# Patient Record
Sex: Male | Born: 1965 | Race: Black or African American | Hispanic: No | Marital: Married | Smoking: Former smoker
Health system: Southern US, Community
[De-identification: ages and names within clinical notes are randomized; demographics above are authoritative.]

## PROBLEM LIST (undated history)

## (undated) DIAGNOSIS — G20C Parkinsonism, unspecified: Secondary | ICD-10-CM

## (undated) DIAGNOSIS — G4752 REM sleep behavior disorder: Secondary | ICD-10-CM

## (undated) DIAGNOSIS — K635 Polyp of colon: Secondary | ICD-10-CM

## (undated) DIAGNOSIS — I10 Essential (primary) hypertension: Secondary | ICD-10-CM

## (undated) DIAGNOSIS — E7800 Pure hypercholesterolemia, unspecified: Secondary | ICD-10-CM

## (undated) DIAGNOSIS — R251 Tremor, unspecified: Secondary | ICD-10-CM

## (undated) HISTORY — DX: Polyp of colon: K63.5

## (undated) HISTORY — DX: REM sleep behavior disorder: G47.52

## (undated) HISTORY — DX: Pure hypercholesterolemia, unspecified: E78.00

## (undated) HISTORY — DX: Essential (primary) hypertension: I10

## (undated) HISTORY — DX: Parkinsonism, unspecified: G20.C

## (undated) HISTORY — DX: Tremor, unspecified: R25.1

---

## 1994-03-09 HISTORY — PX: REPLACEMENT TOTAL KNEE: SUR1224

## 2001-09-24 ENCOUNTER — Emergency Department (HOSPITAL_COMMUNITY): Admission: EM | Admit: 2001-09-24 | Discharge: 2001-09-24 | Payer: Self-pay | Admitting: *Deleted

## 2011-03-25 ENCOUNTER — Ambulatory Visit: Payer: BC Managed Care – PPO | Attending: Family Medicine

## 2011-03-25 DIAGNOSIS — M25519 Pain in unspecified shoulder: Secondary | ICD-10-CM | POA: Insufficient documentation

## 2011-03-25 DIAGNOSIS — IMO0001 Reserved for inherently not codable concepts without codable children: Secondary | ICD-10-CM | POA: Insufficient documentation

## 2011-03-27 ENCOUNTER — Encounter: Payer: BC Managed Care – PPO | Admitting: Physical Therapy

## 2011-03-31 ENCOUNTER — Ambulatory Visit: Payer: BC Managed Care – PPO | Admitting: Physical Therapy

## 2011-04-02 ENCOUNTER — Ambulatory Visit: Payer: BC Managed Care – PPO | Admitting: Physical Therapy

## 2011-04-07 ENCOUNTER — Ambulatory Visit: Payer: BC Managed Care – PPO | Admitting: Physical Therapy

## 2011-04-09 ENCOUNTER — Ambulatory Visit: Payer: BC Managed Care – PPO | Admitting: Physical Therapy

## 2011-04-14 ENCOUNTER — Ambulatory Visit: Payer: BC Managed Care – PPO | Attending: Family Medicine | Admitting: Physical Therapy

## 2011-04-14 DIAGNOSIS — IMO0001 Reserved for inherently not codable concepts without codable children: Secondary | ICD-10-CM | POA: Insufficient documentation

## 2011-04-14 DIAGNOSIS — M25519 Pain in unspecified shoulder: Secondary | ICD-10-CM | POA: Insufficient documentation

## 2011-04-16 ENCOUNTER — Ambulatory Visit: Payer: BC Managed Care – PPO | Admitting: Physical Therapy

## 2012-04-23 ENCOUNTER — Emergency Department (HOSPITAL_COMMUNITY)
Admission: EM | Admit: 2012-04-23 | Discharge: 2012-04-23 | Disposition: A | Discharge status: Home / Self Care | Payer: BC Managed Care – PPO | Source: Home / Self Care | Attending: Family Medicine | Admitting: Family Medicine

## 2012-04-23 ENCOUNTER — Encounter (HOSPITAL_COMMUNITY): Payer: Self-pay | Admitting: Emergency Medicine

## 2012-04-23 DIAGNOSIS — M5432 Sciatica, left side: Secondary | ICD-10-CM

## 2012-04-23 DIAGNOSIS — M5430 Sciatica, unspecified side: Secondary | ICD-10-CM

## 2012-04-23 MED ORDER — IBUPROFEN 600 MG PO TABS: 600.0000 mg | ORAL_TABLET | Freq: Three times a day (TID) | ORAL | Status: DC | PRN

## 2012-04-23 MED ORDER — PREDNISONE 20 MG PO TABS: ORAL_TABLET | ORAL | Status: DC

## 2012-04-23 MED ORDER — CYCLOBENZAPRINE HCL 10 MG PO TABS: 10.0000 mg | ORAL_TABLET | Freq: Two times a day (BID) | ORAL | Status: DC | PRN

## 2012-04-23 MED ORDER — TRAMADOL HCL 50 MG PO TABS: 50.0000 mg | ORAL_TABLET | Freq: Three times a day (TID) | ORAL | Status: DC | PRN

## 2012-04-23 NOTE — ED Notes (Signed)
Pt states that he recently had a pulled muscle in his lower back and felt some pain on Monday but kept working through it. Pain has gradually gotten worse and now is having pain/numbness in left leg.  Pt has not tried any medications for symptoms.

## 2012-04-27 NOTE — ED Provider Notes (Signed)
History     CSN: [id number]  Arrival date & time 04/23/12  1650   First MD Initiated Contact with Patient 04/23/12 1706      Chief Complaint  Patient presents with  . Back Pain    lower back pain. recently dx with pulled muscle in back. reinjured at work.     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 Comes c/o left lower back pain with radiation to left thigh for 4 days. Denies known injury or recent falls. Reports pain started gradually and was initially mild and has continued to go to work but pain is getting worse. Not taking any medication. No abdominal pain, no dysuria or hematuria. No incontinence or urinary retention. Reports tingling sensation in left thigh but denies low extremity numbness or weakness.    History reviewed. No pertinent past medical history.  History reviewed. No pertinent past surgical history.  History reviewed. No pertinent family history.  History  Substance Use Topics  . Smoking status: Current Every Day Smoker -- 0.50 packs/day    Types: Cigarettes  . Smokeless tobacco: Not on file  . Alcohol Use: Yes     Comment: occasional      Review of Systems  Constitutional: Negative for fever and chills.  Gastrointestinal: Negative for nausea and abdominal pain.  Genitourinary: Negative for dysuria, hematuria and flank pain.  Musculoskeletal: Positive for back pain.  Skin: Negative for rash.  Neurological: Negative for weakness and numbness.    Allergies  Review of patient's allergies indicates no known allergies.  Home Medications   Current Outpatient Rx  Name  Route  Sig  Dispense  Refill  . Atorvastatin Calcium (LIPITOR PO)   Oral   Take by mouth.         . cyclobenzaprine (FLEXERIL) 10 MG tablet   Oral   Take 1 tablet (10 mg total) by mouth 2 (two) times daily as needed for muscle spasms.   20 tablet   0   . ibuprofen  (ADVIL,MOTRIN) 600 MG tablet   Oral   Take 1 tablet (600 mg total) by mouth every 8 (eight) hours as needed for pain. Take with meals   30 tablet   0   . predniSONE (DELTASONE) 20 MG tablet      2 tabs by mouth daily for 5 days   10 tablet   0   . traMADol (ULTRAM) 50 MG tablet   Oral   Take 1 tablet (50 mg total) by mouth every 8 (eight) hours as needed for pain.   20 tablet   0     BP 191/90  Pulse 62  Temp(Src) 98.7 F (37.1 C) (Oral)  Resp 18  SpO2 100%  Physical Exam  Nursing note and vitals reviewed. Constitutional: He is oriented to person, place, and time. He appears well-developed and well-nourished. No distress.  HENT:  Head: Normocephalic and atraumatic.  Cardiovascular: Normal heart sounds.   Pulmonary/Chest: Breath sounds normal.  Abdominal: Soft. There is no tenderness.  No CVT bilat  Musculoskeletal:  Central spine with no scoliosis or kyphosis. Fair anterior flexion and posterior extension. Patient able to walk on tip toes and heels with no difficulty foot drop or pain exacerbation. No bone prominence tenderness. Tenderness to palpation in bilateral lumbar paravertebral muscles worse in left side. Negative straight leg test bilateral. Intact sensation and symmetric + DTRs (rotullian and achillean) in low extremities.   Neurological: He is alert and  oriented to person, place, and time.  Skin: No rash noted. He is not diaphoretic.    ED Course  Procedures (including critical care time)  Labs Reviewed - No data to display No results found.   1. Sciatica neuralgia, left       MDM  Treated with prednisone, ibuprofen, tramadol and flexeril. Supportive care including rehab exercises and red flags that should prompt return to medical attention discussed with patient and provided in writing.         Sharin Grave, MD 04/27/12 [phone]

## 2013-03-09 HISTORY — PX: COLONOSCOPY: SHX174

## 2016-10-08 ENCOUNTER — Telehealth: Payer: Self-pay

## 2016-10-08 NOTE — Telephone Encounter (Signed)
SENT NOTES TO SCHEDULING 

## 2016-10-16 ENCOUNTER — Telehealth: Payer: Self-pay | Admitting: Internal Medicine

## 2016-10-16 NOTE — Telephone Encounter (Signed)
Received incoming records from [facility] for upcoming appointment on 11/03/16 @ 11:15am with Dr. Rennis GoldenHilty. [facility] 10/16/16 ab

## 2016-10-20 ENCOUNTER — Encounter: Payer: Self-pay | Admitting: *Deleted

## 2016-11-03 ENCOUNTER — Ambulatory Visit: Payer: Self-pay | Admitting: Internal Medicine

## 2016-11-03 ENCOUNTER — Ambulatory Visit (INDEPENDENT_AMBULATORY_CARE_PROVIDER_SITE_OTHER): Payer: BLUE CROSS/BLUE SHIELD | Admitting: Internal Medicine

## 2016-11-03 ENCOUNTER — Encounter: Payer: Self-pay | Admitting: Internal Medicine

## 2016-11-03 VITALS — BP 132/82 | HR 61 | Ht 68.0 in | Wt 187.2 lb

## 2016-11-03 DIAGNOSIS — R0789 Other chest pain: Secondary | ICD-10-CM

## 2016-11-03 DIAGNOSIS — Z8249 Family history of ischemic heart disease and other diseases of the circulatory system: Secondary | ICD-10-CM

## 2016-11-03 DIAGNOSIS — E785 Hyperlipidemia, unspecified: Secondary | ICD-10-CM | POA: Diagnosis not present

## 2016-11-03 DIAGNOSIS — F17200 Nicotine dependence, unspecified, uncomplicated: Secondary | ICD-10-CM

## 2016-11-03 HISTORY — DX: Other chest pain: R07.89

## 2016-11-03 HISTORY — DX: Family history of ischemic heart disease and other diseases of the circulatory system: Z82.49

## 2016-11-03 HISTORY — DX: Hyperlipidemia, unspecified: E78.5
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
## 2016-11-03 NOTE — Patient Instructions (Addendum)
Your physician has requested that you have an exercise tolerance test. For further information please visit [email or web address] Please also follow instruction sheet, as given.  Your physician has requested that you have an abdominal aorta duplex. During this test, an ultrasound is used to evaluate the aorta. Allow 30 minutes for this exam. Do not eat after midnight the day before and avoid carbonated beverages  Your physician recommends that you schedule a follow-up appointment with Dr. Rennis Golden after your testing.

## 2016-11-06 ENCOUNTER — Telehealth (HOSPITAL_COMMUNITY): Payer: Self-pay

## 2016-11-06 NOTE — Telephone Encounter (Signed)
Encounter complete. 

## 2016-11-11 ENCOUNTER — Ambulatory Visit (HOSPITAL_COMMUNITY)
Admission: RE | Admit: 2016-11-11 | Discharge: 2016-11-11 | Disposition: A | Discharge status: Home / Self Care | Payer: BLUE CROSS/BLUE SHIELD | Source: Ambulatory Visit | Attending: Cardiology | Admitting: Cardiology

## 2016-11-11 DIAGNOSIS — R9439 Abnormal result of other cardiovascular function study: Secondary | ICD-10-CM | POA: Diagnosis not present

## 2016-11-11 DIAGNOSIS — Z8249 Family history of ischemic heart disease and other diseases of the circulatory system: Secondary | ICD-10-CM | POA: Diagnosis not present

## 2016-11-11 DIAGNOSIS — R0789 Other chest pain: Secondary | ICD-10-CM | POA: Diagnosis not present

## 2016-11-11 DIAGNOSIS — E785 Hyperlipidemia, unspecified: Secondary | ICD-10-CM | POA: Diagnosis not present

## 2016-11-11 LAB — EXERCISE TOLERANCE TEST
CHL CUP RESTING HR STRESS: 58 {beats}/min
CHL RATE OF PERCEIVED EXERTION: 18
CSEPEDS: 52 s
CSEPEW: 9.8 METS
CSEPHR: 86 %
CSEPPHR: 146 {beats}/min
Exercise duration (min): 7 min
MPHR: 168 {beats}/min

## 2016-11-12 ENCOUNTER — Other Ambulatory Visit: Payer: Self-pay | Admitting: *Deleted

## 2016-11-12 DIAGNOSIS — R9430 Abnormal result of cardiovascular function study, unspecified: Secondary | ICD-10-CM

## 2016-11-12 DIAGNOSIS — R0789 Other chest pain: Secondary | ICD-10-CM

## 2016-11-16 ENCOUNTER — Telehealth: Payer: Self-pay | Admitting: Internal Medicine

## 2016-11-16 ENCOUNTER — Ambulatory Visit (HOSPITAL_COMMUNITY)
Admission: RE | Admit: 2016-11-16 | Discharge: 2016-11-16 | Disposition: A | Discharge status: Home / Self Care | Payer: BLUE CROSS/BLUE SHIELD | Source: Ambulatory Visit | Attending: Cardiology | Admitting: Cardiology

## 2016-11-16 DIAGNOSIS — Z01812 Encounter for preprocedural laboratory examination: Secondary | ICD-10-CM

## 2016-11-16 DIAGNOSIS — I708 Atherosclerosis of other arteries: Secondary | ICD-10-CM | POA: Diagnosis not present

## 2016-11-16 DIAGNOSIS — E785 Hyperlipidemia, unspecified: Secondary | ICD-10-CM | POA: Diagnosis not present

## 2016-11-16 DIAGNOSIS — Z8249 Family history of ischemic heart disease and other diseases of the circulatory system: Secondary | ICD-10-CM | POA: Diagnosis present

## 2016-11-16 DIAGNOSIS — F17200 Nicotine dependence, unspecified, uncomplicated: Secondary | ICD-10-CM

## 2016-11-16 DIAGNOSIS — Z79899 Other long term (current) drug therapy: Secondary | ICD-10-CM

## 2016-11-16 DIAGNOSIS — I700 Atherosclerosis of aorta: Secondary | ICD-10-CM | POA: Diagnosis not present

## 2016-11-16 NOTE — Telephone Encounter (Signed)
Spoke with patient. Communicated results. He agrees to further testing (coronary CT) and is aware to have BMET done a few days prior to CT at our office.

## 2016-11-16 NOTE — Telephone Encounter (Signed)
Follow up ° ° °Pt is returning call to nurse.  °

## 2016-11-16 NOTE — Telephone Encounter (Signed)
Notes recorded by Chrystie NoseHilty, Kenneth C, MD on 11/12/2016 at 8:28 AM EDT Equivocal exercise stress test with chest pain complaints and borderline ST changes. Recommend a coronary CT angiogram. Follow-up with me afterwards.  DR. H  ______________ [facility]

## 2016-11-16 NOTE — Telephone Encounter (Signed)
New Message ° ° pt verbalized that he is returning call for rn  °

## 2016-12-04 ENCOUNTER — Ambulatory Visit (INDEPENDENT_AMBULATORY_CARE_PROVIDER_SITE_OTHER): Payer: BLUE CROSS/BLUE SHIELD | Admitting: Internal Medicine

## 2016-12-04 ENCOUNTER — Encounter: Payer: Self-pay | Admitting: Internal Medicine

## 2016-12-04 VITALS — BP 124/80 | HR 55 | Ht 69.5 in | Wt 188.6 lb

## 2016-12-04 DIAGNOSIS — E785 Hyperlipidemia, unspecified: Secondary | ICD-10-CM

## 2016-12-04 DIAGNOSIS — F17200 Nicotine dependence, unspecified, uncomplicated: Secondary | ICD-10-CM | POA: Diagnosis not present

## 2016-12-04 DIAGNOSIS — R0789 Other chest pain: Secondary | ICD-10-CM | POA: Diagnosis not present

## 2016-12-04 NOTE — Patient Instructions (Signed)
Your physician recommends that you schedule a follow-up appointment in: As Needed    
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
## 2017-02-03 ENCOUNTER — Encounter: Payer: Self-pay | Admitting: Internal Medicine

## 2019-03-07 ENCOUNTER — Other Ambulatory Visit: Payer: Self-pay | Admitting: Family Medicine

## 2019-03-07 ENCOUNTER — Ambulatory Visit
Admission: RE | Admit: 2019-03-07 | Discharge: 2019-03-07 | Disposition: A | Discharge status: Home / Self Care | Payer: BLUE CROSS/BLUE SHIELD | Source: Ambulatory Visit | Attending: Family Medicine | Admitting: Family Medicine

## 2019-03-07 DIAGNOSIS — R634 Abnormal weight loss: Secondary | ICD-10-CM

## 2019-03-07 DIAGNOSIS — R1084 Generalized abdominal pain: Secondary | ICD-10-CM

## 2019-03-07 IMAGING — DX DG CHEST 2V
2 series · 2 of 2 positions shown · non-contrast
Comparison: None.

CLINICAL DATA: Abnormal weight loss

EXAM:
CHEST - 2 VIEW

[dg chest 2 view (1 of 2)]
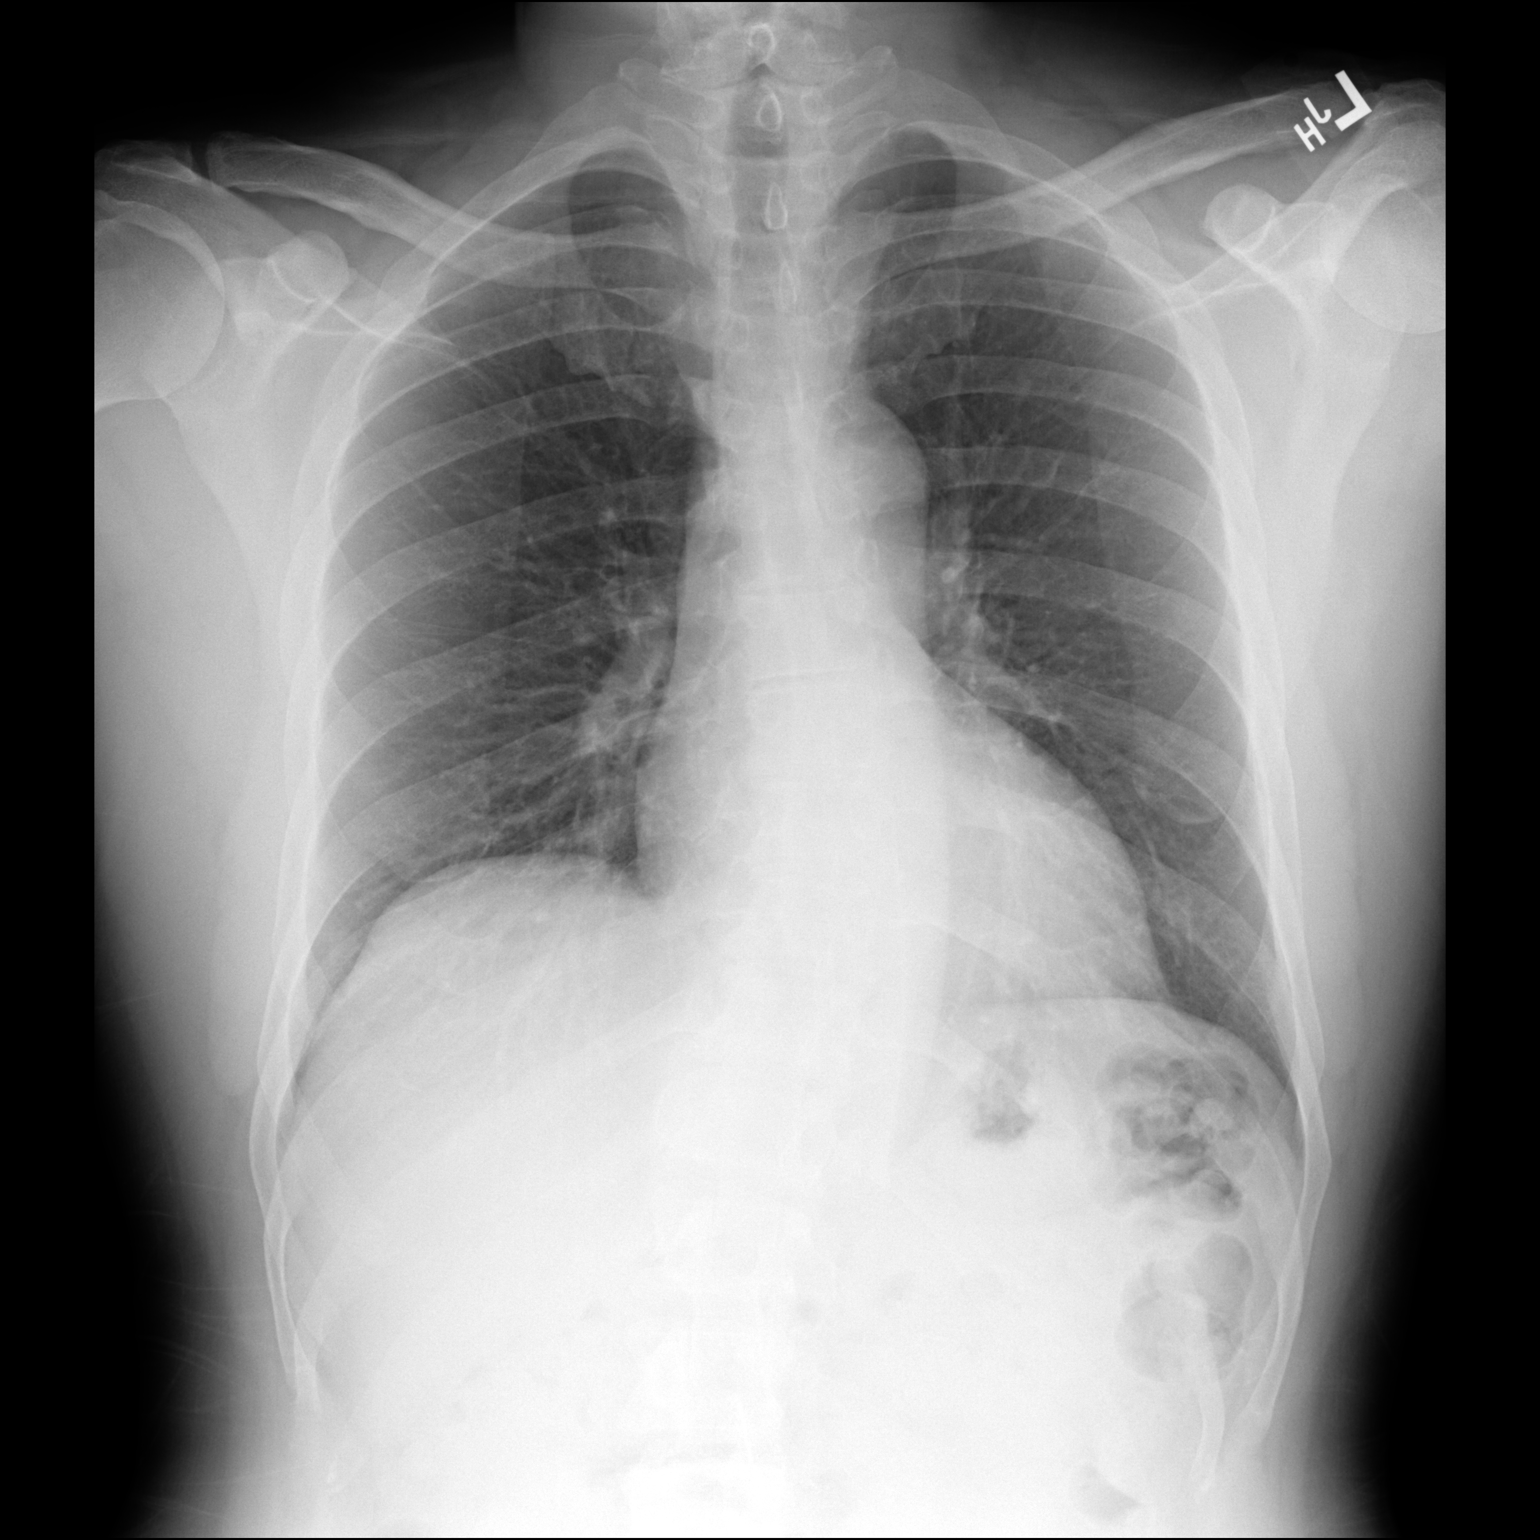

[dg chest 2 view (2 of 2)]
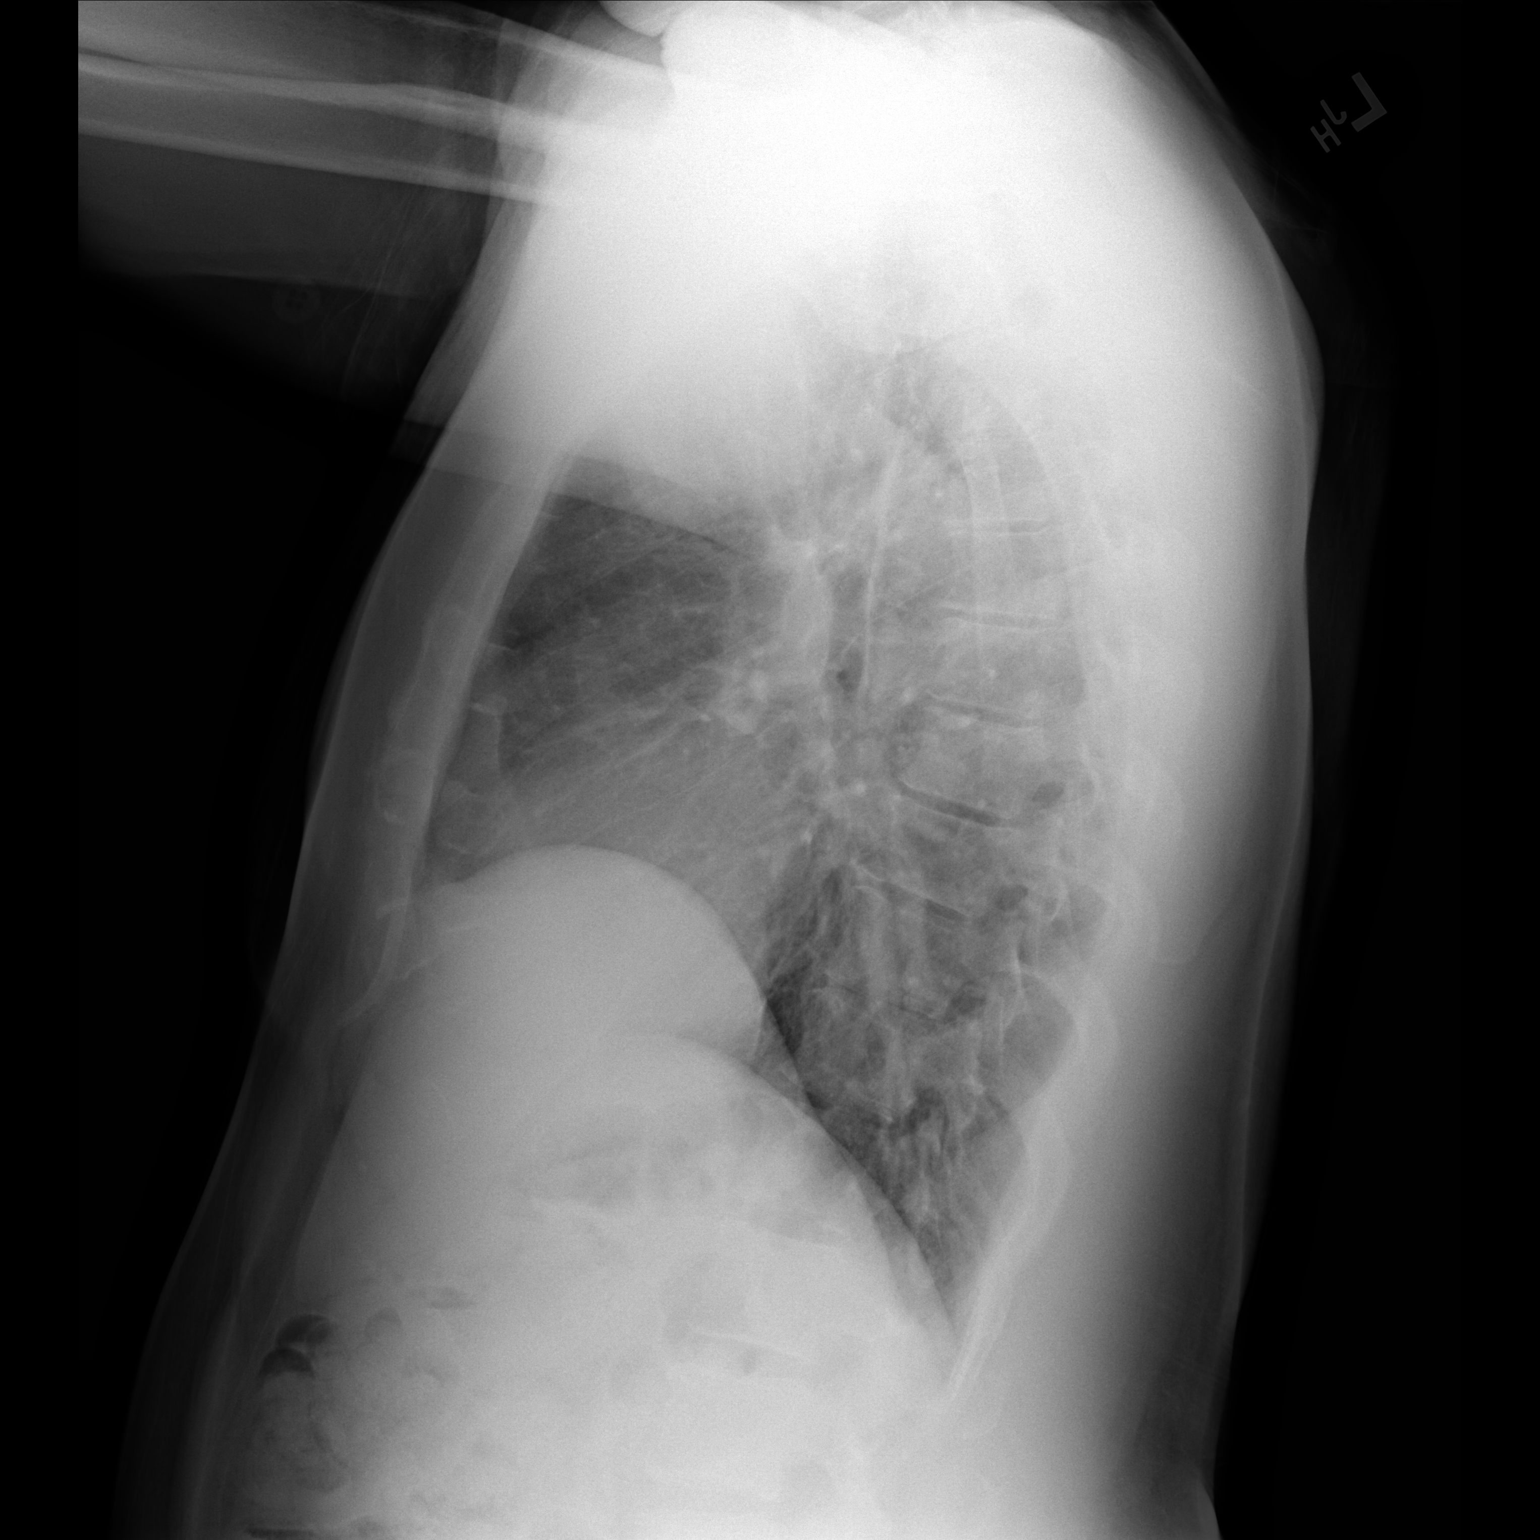

[2 of 2 positions shown; findings below may reference images not displayed]

FINDINGS: The heart size and mediastinal contours are within normal limits.
Both lungs are clear. The visualized skeletal structures are
unremarkable.
IMPRESSION: No active cardiopulmonary disease.

## 2019-03-17 ENCOUNTER — Ambulatory Visit
Admission: RE | Admit: 2019-03-17 | Discharge: 2019-03-17 | Disposition: A | Discharge status: Home / Self Care | Payer: Managed Care, Other (non HMO) | Source: Ambulatory Visit | Attending: Family Medicine | Admitting: Family Medicine

## 2019-03-17 DIAGNOSIS — R1084 Generalized abdominal pain: Secondary | ICD-10-CM

## 2019-03-17 IMAGING — CT CT ABD-PELV W/ CM
2 of 5 series · 13 of 46 positions shown, 15 images · IV contrast (iopamidol)
Comparison: None.

CLINICAL DATA: Unexplained 10 lb weight loss in 1 month.

EXAM:
CT ABDOMEN AND PELVIS WITH CONTRAST
TECHNIQUE: Multidetector CT imaging of the abdomen and pelvis was performed
using the standard protocol following bolus administration of
intravenous contrast.
CONTRAST:  100mL [id number] IOPAMIDOL ([id number]) INJECTION 61%

[Series 2: abd pelvis 5.00 br40 s3 axial · axial · 0.62mm/px · z∈[+1136,+1551]mm · 10 of 95 slices shown, 12 images]
[im 6/95  soft-tissue]
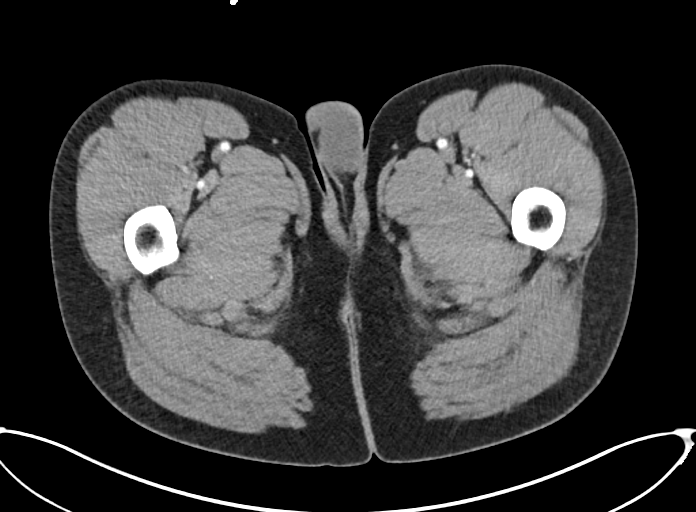
[im 6/95  bone]
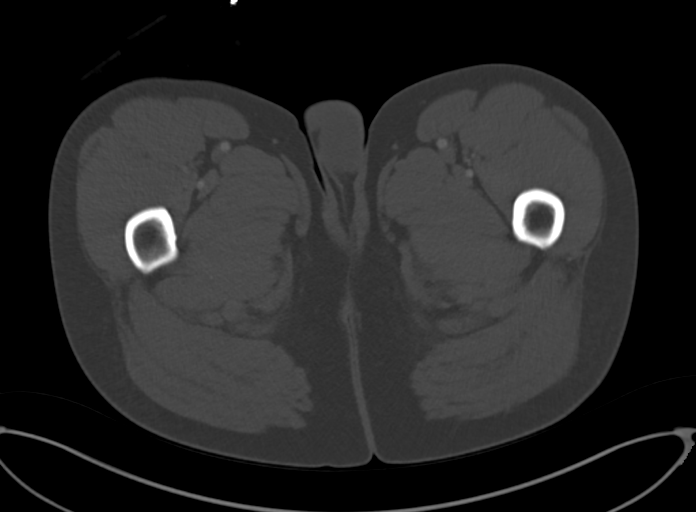
[im 17/95  soft-tissue]
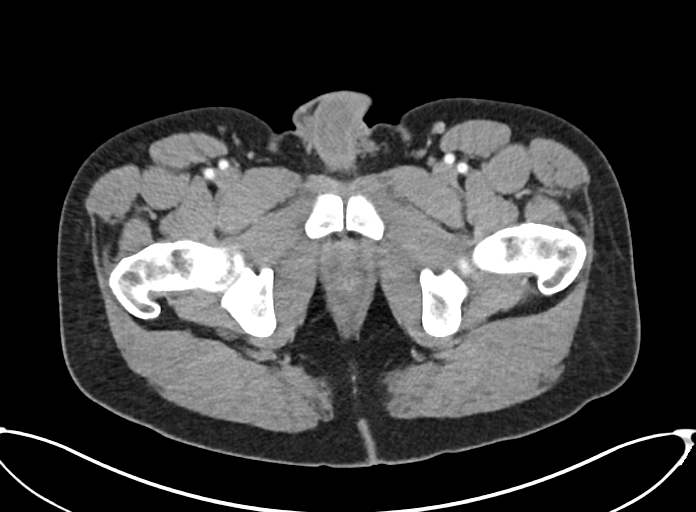
[im 28/95  soft-tissue]
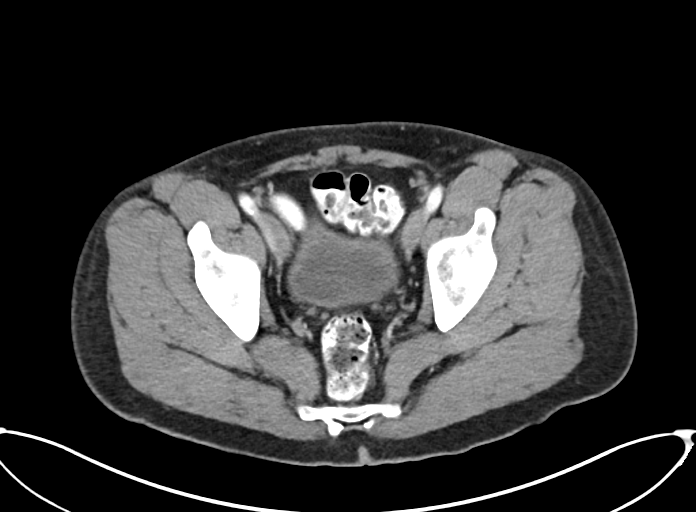
[im 34/95  soft-tissue]
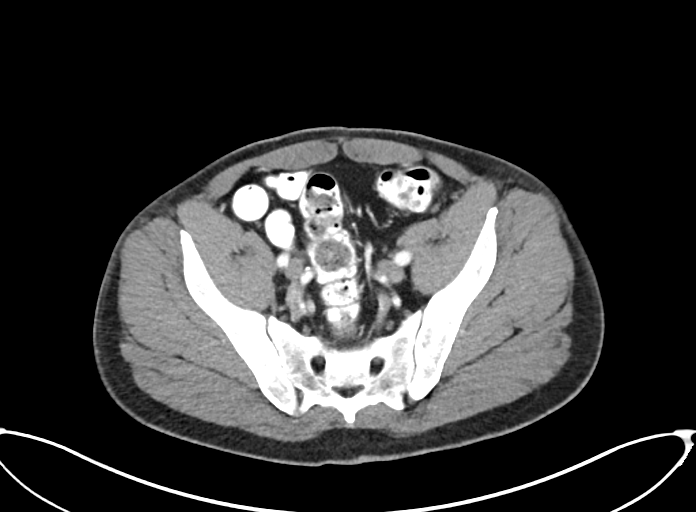
[im 45/95  soft-tissue]
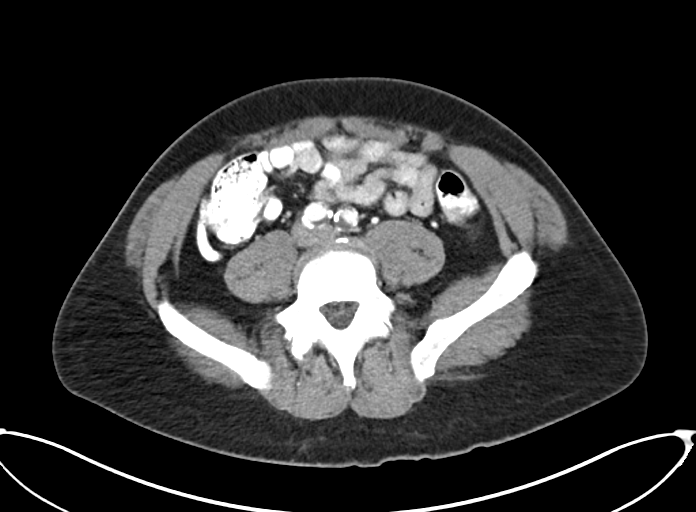
[im 50/95  soft-tissue]
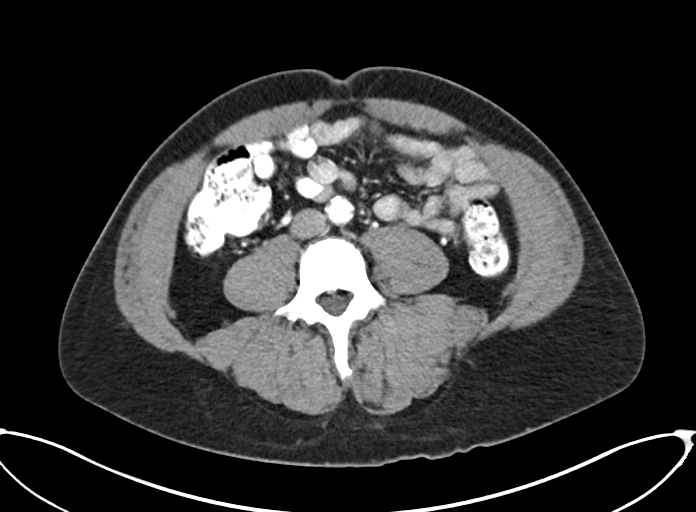
[im 61/95  soft-tissue]
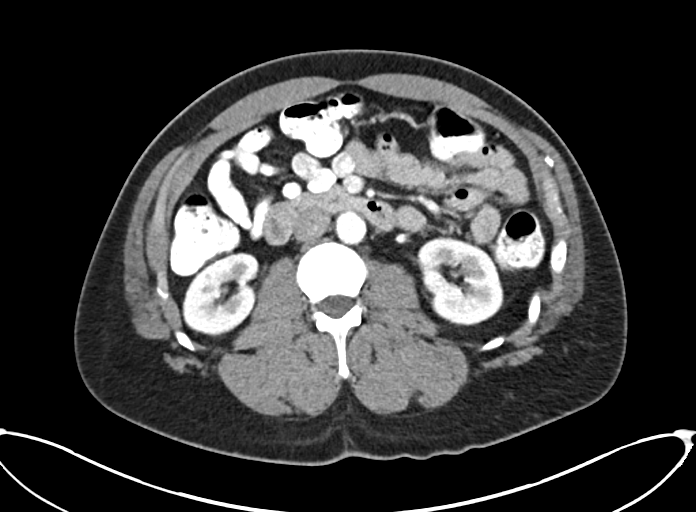
[im 72/95  soft-tissue]
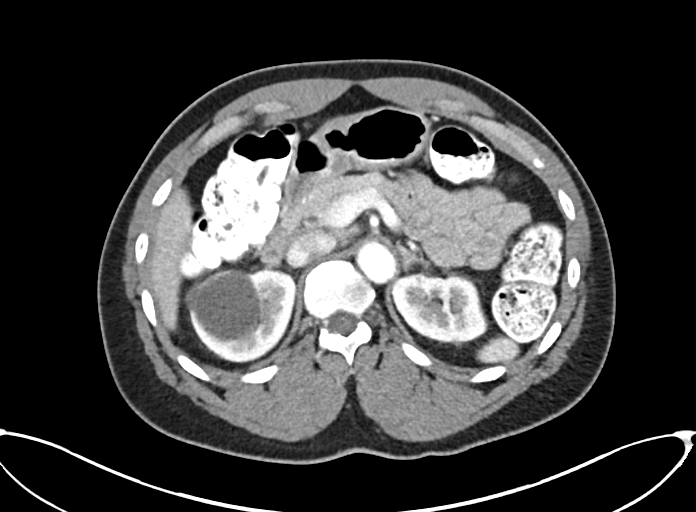
[im 78/95  soft-tissue]
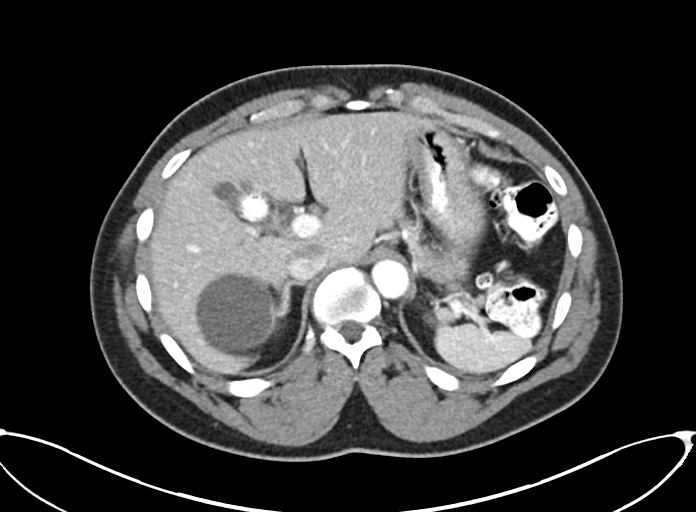
[im 78/95  bone]
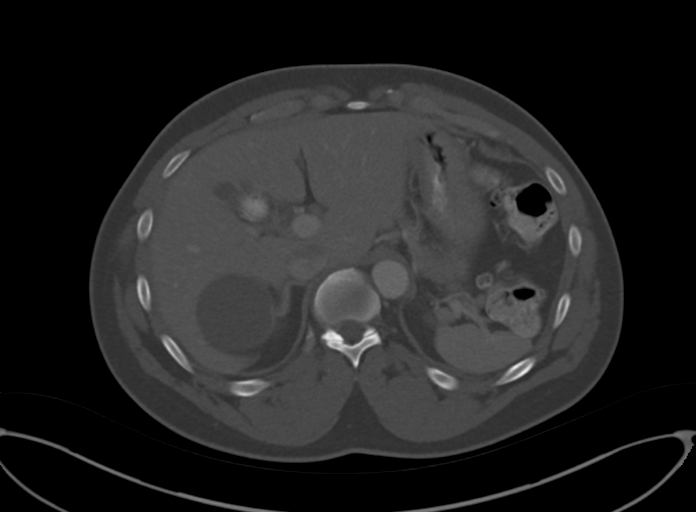
[im 89/95  soft-tissue]
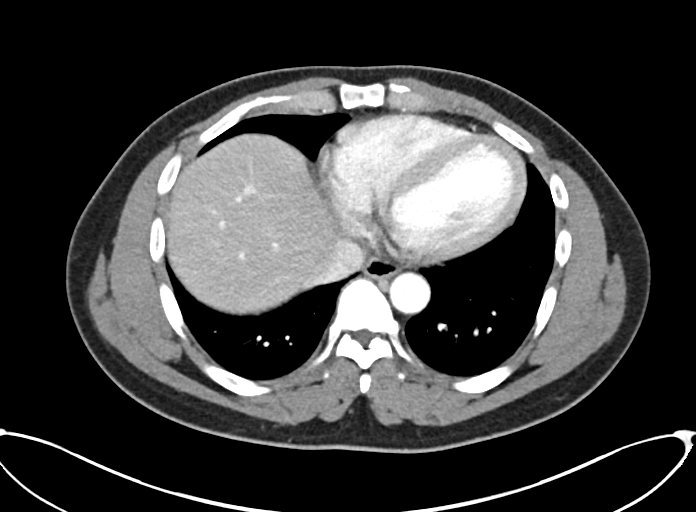

[Series 6: abd pelvis 2.00 br40 s3 cor · coronal · 0.84mm/px · 3 of 152 slices shown]
[im 51/152  soft-tissue]
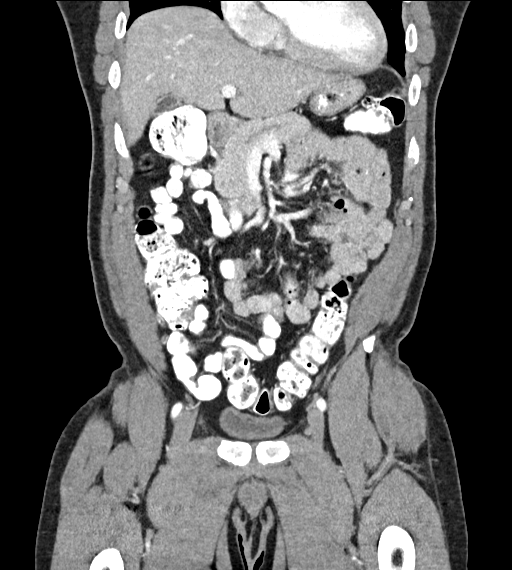
[im 68/152  soft-tissue]
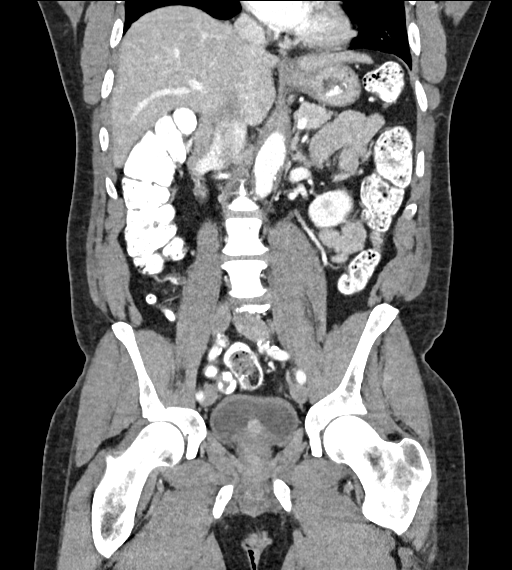
[im 84/152  soft-tissue]
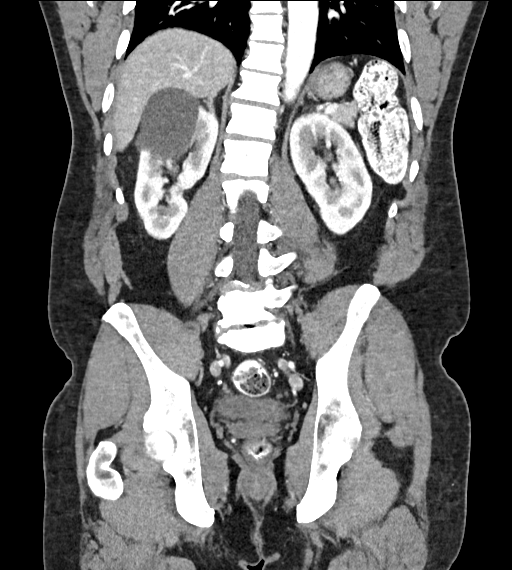

[13 of 46 positions shown; findings below may reference images not displayed]

FINDINGS: Lower Chest: No acute findings.

Hepatobiliary: No hepatic masses identified. Gallbladder is
unremarkable. No evidence of biliary ductal dilatation.

Pancreas:  No mass or inflammatory changes.

Spleen: Within normal limits in size and appearance.

Adrenals/Urinary Tract: No masses identified. A few simple renal
cysts are seen bilaterally, largest in upper pole of right kidney
measuring 5 cm. No evidence of ureteral calculi or hydronephrosis.
Unremarkable unopacified urinary bladder.

Stomach/Bowel: No evidence of obstruction, inflammatory process or
abnormal fluid collections. Normal appendix visualized.

Vascular/Lymphatic: No pathologically enlarged lymph nodes. No
abdominal aortic aneurysm. Aortic atherosclerosis incidentally
noted.

Reproductive:  Mild prostate median lobe hypertrophy noted.

Other:  None.

Musculoskeletal:  No suspicious bone lesions identified.
IMPRESSION: 1. No acute findings.  No evidence of malignancy.
2. Mild prostate median lobe hypertrophy.

Aortic Atherosclerosis (326M2-BKR.R).

## 2019-03-17 MED ORDER — IOPAMIDOL (ISOVUE-300) INJECTION 61%
100.0000 mL | Freq: Once | INTRAVENOUS | Status: AC | PRN
  Administered 2019-03-17: 100 mL via INTRAVENOUS

## 2019-12-19 ENCOUNTER — Other Ambulatory Visit: Payer: Self-pay | Admitting: Family Medicine

## 2019-12-19 ENCOUNTER — Other Ambulatory Visit: Payer: Self-pay | Admitting: Critical Care Medicine

## 2019-12-19 DIAGNOSIS — R7401 Elevation of levels of liver transaminase levels: Secondary | ICD-10-CM

## 2019-12-20 ENCOUNTER — Other Ambulatory Visit: Payer: Managed Care, Other (non HMO)

## 2019-12-27 ENCOUNTER — Ambulatory Visit
Admission: RE | Admit: 2019-12-27 | Discharge: 2019-12-27 | Disposition: A | Discharge status: Home / Self Care | Payer: Managed Care, Other (non HMO) | Source: Ambulatory Visit | Attending: Family Medicine | Admitting: Family Medicine

## 2019-12-27 DIAGNOSIS — R7401 Elevation of levels of liver transaminase levels: Secondary | ICD-10-CM

## 2019-12-27 IMAGING — US US ABDOMEN COMPLETE
1 series · 14 of 25 positions shown · non-contrast
Comparison: CT 03/17/2019

CLINICAL DATA: Transaminitis

EXAM:
ABDOMEN ULTRASOUND COMPLETE

[Series 1: us abdomen complete · 0.17mm/px · 14 of 78 slices shown]
[im 1/78]
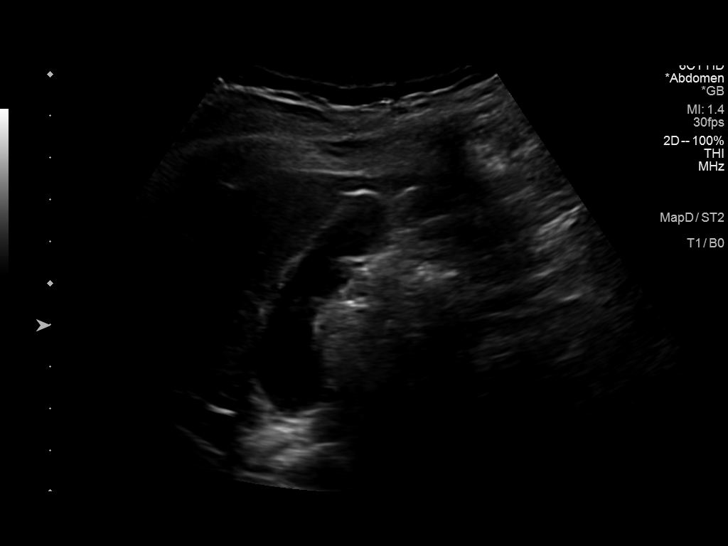
[im 7/78]
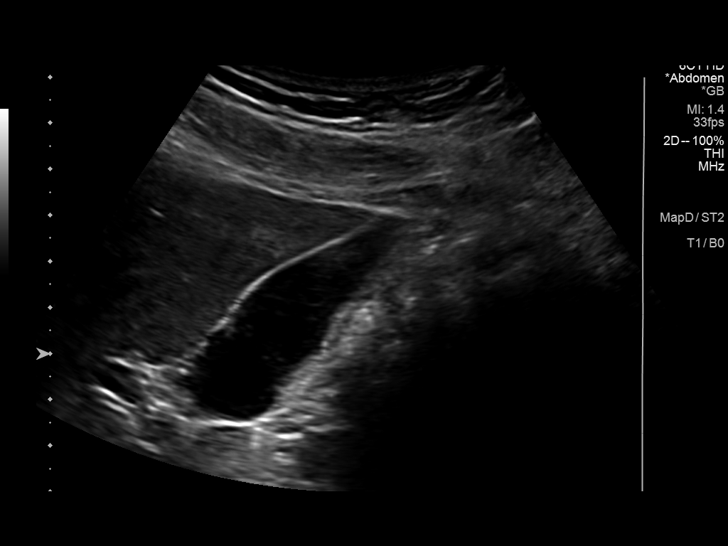
[im 13/78]
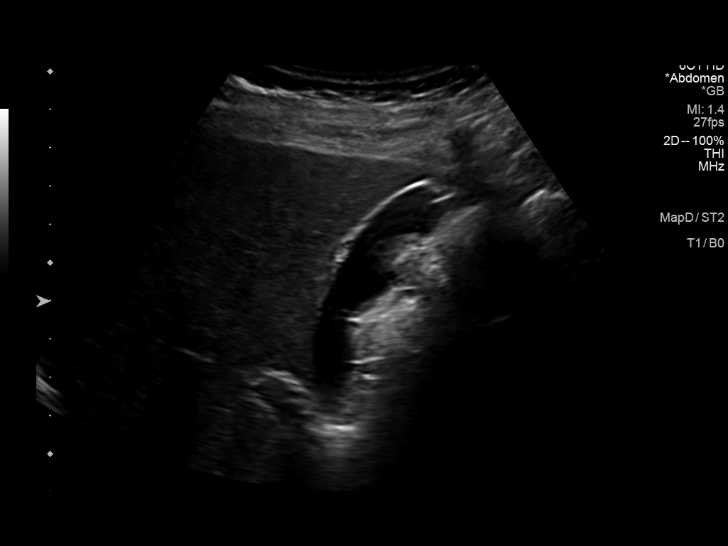
[im 20/78]
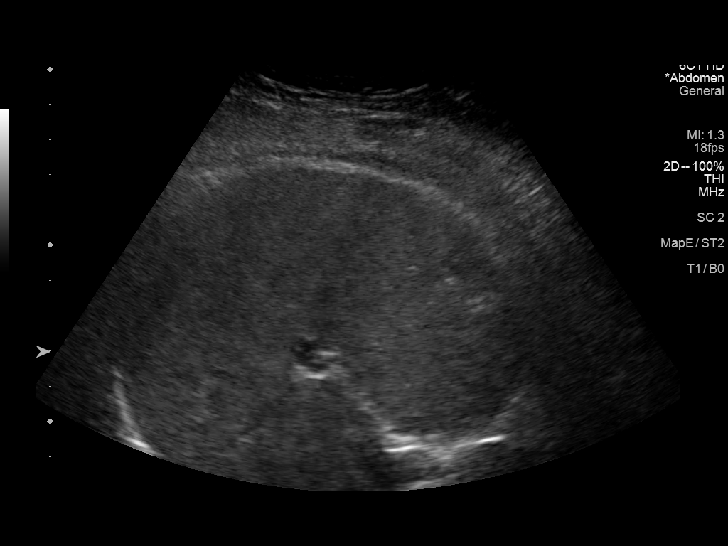
[im 26/78]
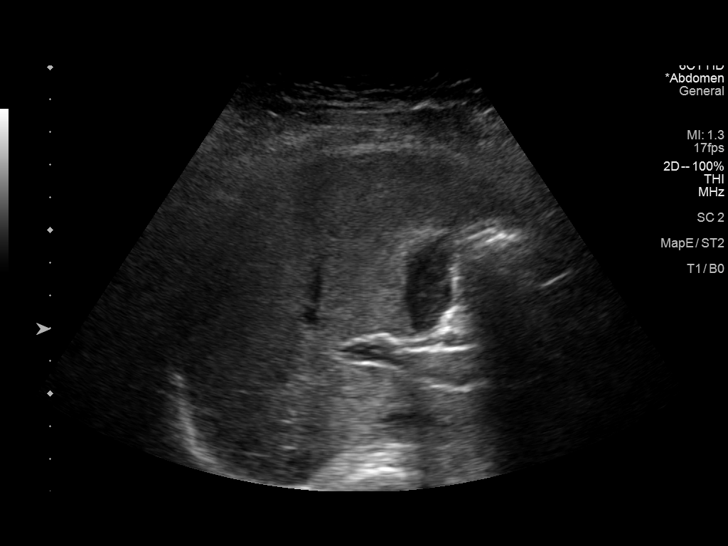
[im 29/78]
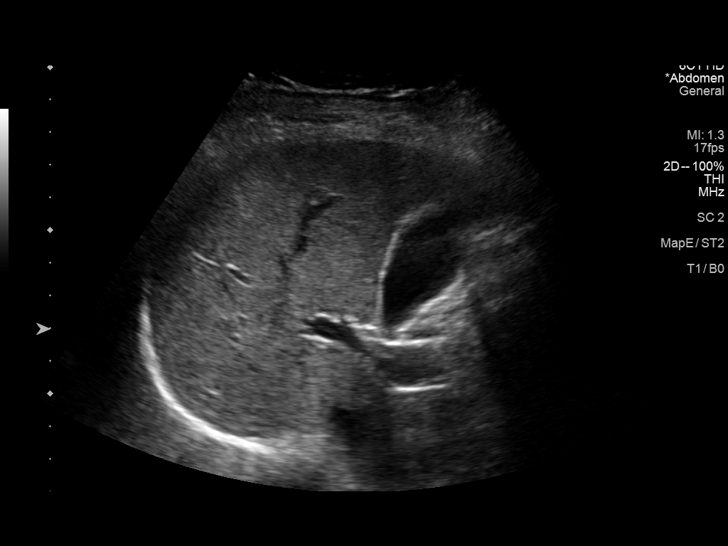
[im 36/78]
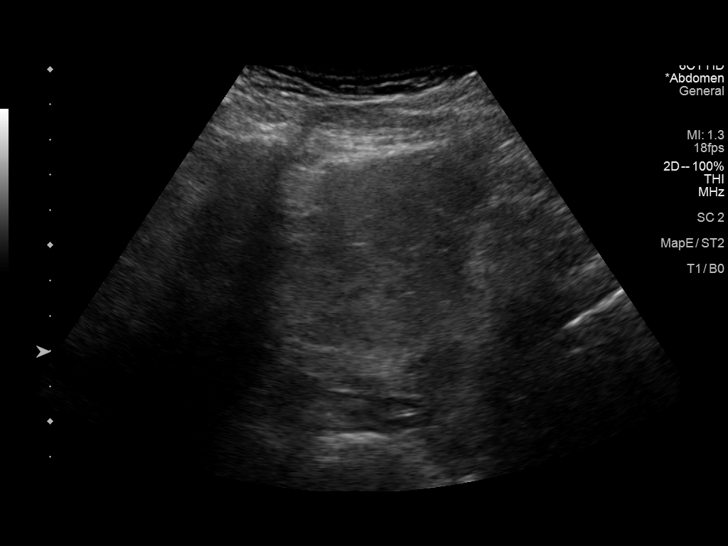
[im 42/78]
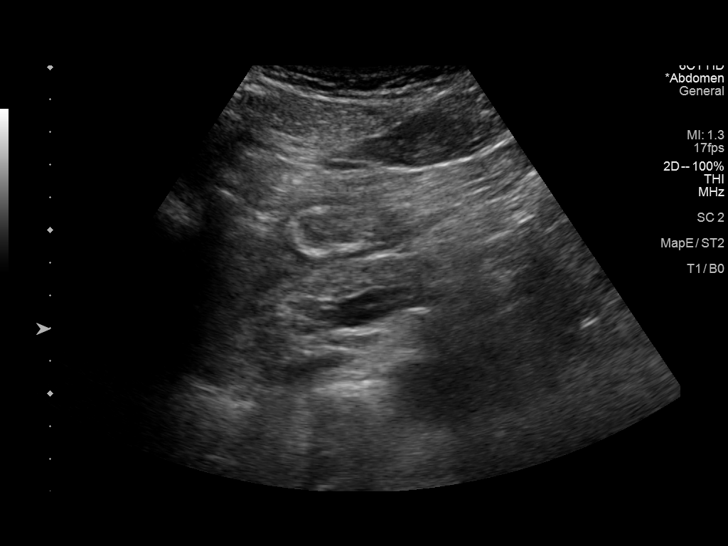
[im 49/78]
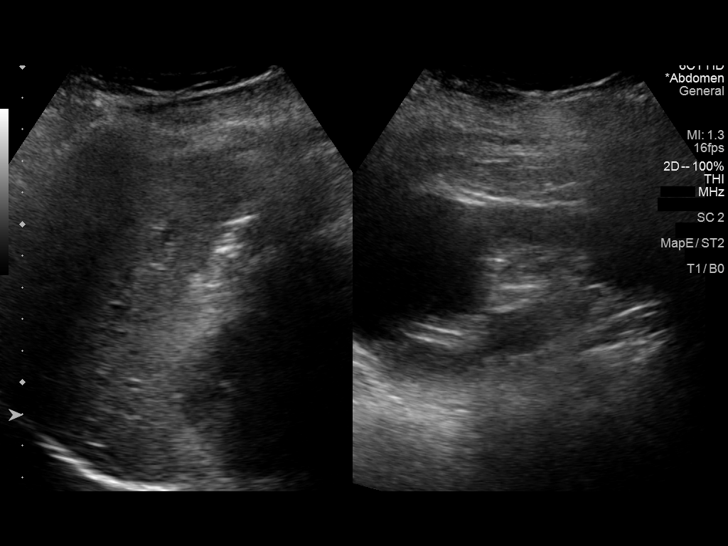
[im 52/78]
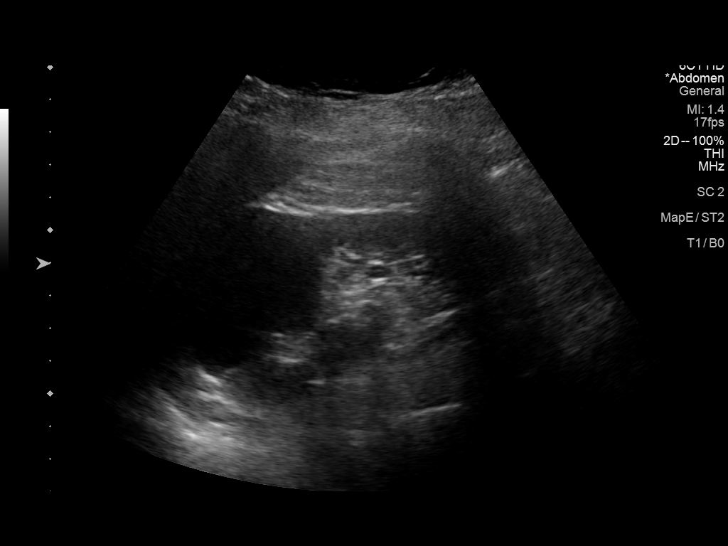
[im 58/78]
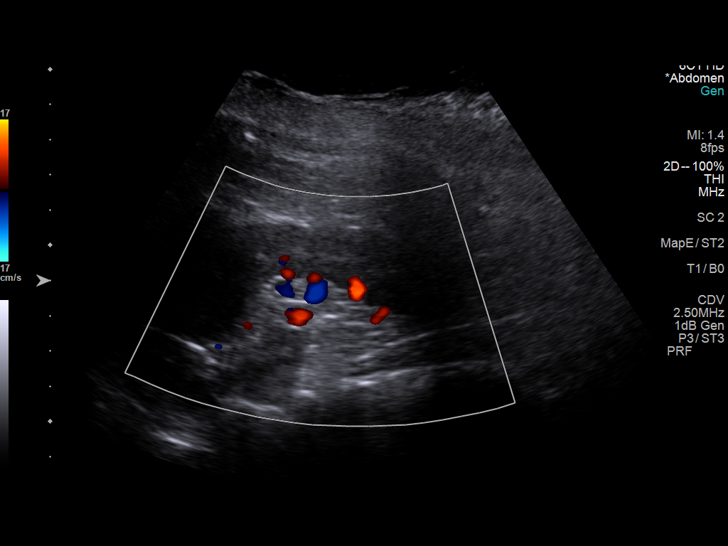
[im 65/78]
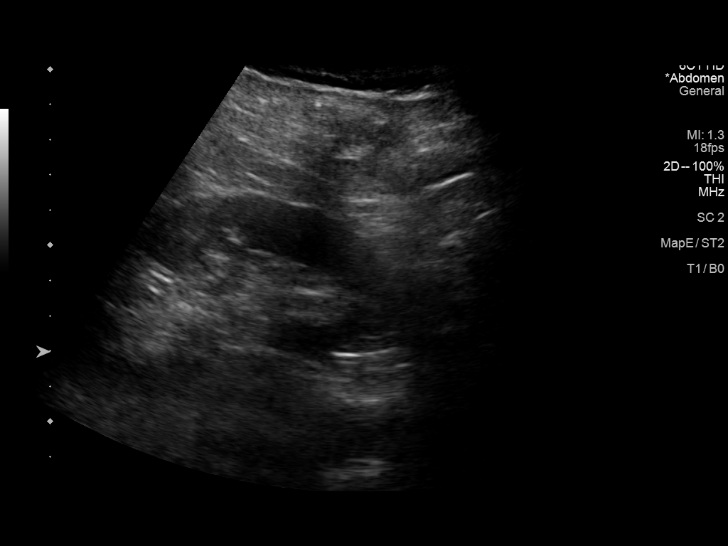
[im 71/78]
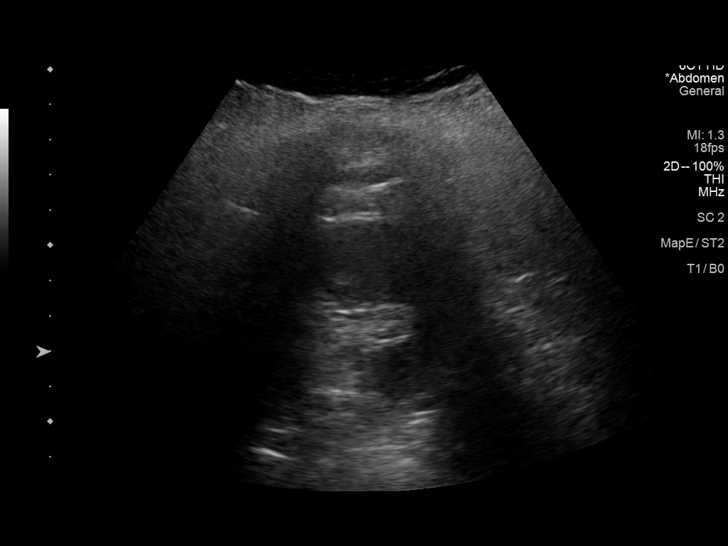
[im 78/78]
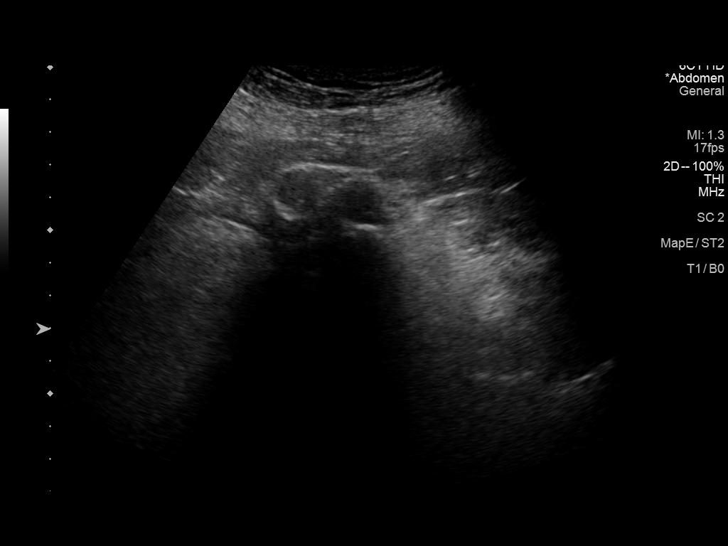

[14 of 25 positions shown; findings below may reference images not displayed]

FINDINGS: Gallbladder: No gallstones or wall thickening visualized. No
sonographic Murphy sign noted by sonographer.

Common bile duct: Diameter: 2.9 mm

Liver: Liver is diffusely echogenic. No focal hepatic abnormality.
Portal vein is patent on color Doppler imaging with normal direction
of blood flow towards the liver.

IVC: No abnormality visualized.

Pancreas: Visualized portion unremarkable.

Spleen: Size and appearance within normal limits.

Right Kidney: Length: 11.7 cm. No hydronephrosis. Anechoic cyst off
the upper pole measuring 5.2 x 4.9 x 5 cm.

Left Kidney: Length: 10 cm. Echogenicity within normal limits. No
mass or hydronephrosis visualized.

Abdominal aorta: No aneurysm visualized.

Other findings: None.
IMPRESSION: 1. Echogenic liver consistent with steatosis and or hepatocellular
disease
2. Simple appearing 5.2 cm right renal cyst

## 2020-10-07 DIAGNOSIS — Z8616 Personal history of COVID-19: Secondary | ICD-10-CM

## 2020-10-07 HISTORY — DX: Personal history of COVID-19: Z86.16

## 2020-12-12 ENCOUNTER — Encounter: Payer: Self-pay | Admitting: Physician Assistant

## 2020-12-25 ENCOUNTER — Ambulatory Visit (INDEPENDENT_AMBULATORY_CARE_PROVIDER_SITE_OTHER): Payer: Managed Care, Other (non HMO) | Admitting: Physician Assistant

## 2020-12-25 ENCOUNTER — Other Ambulatory Visit: Payer: Self-pay

## 2020-12-25 ENCOUNTER — Encounter: Payer: Self-pay | Admitting: Physician Assistant

## 2020-12-25 VITALS — BP 141/85 | HR 81 | Ht 68.0 in | Wt 181.0 lb

## 2020-12-25 DIAGNOSIS — R413 Other amnesia: Secondary | ICD-10-CM | POA: Diagnosis not present

## 2020-12-25 DIAGNOSIS — F32A Depression, unspecified: Secondary | ICD-10-CM
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
## 2020-12-25 NOTE — Patient Instructions (Addendum)
We have sent a referral to [facility] for your MRI and they will call you directly to schedule your appointment. They are located at [address] [facility]. If you need to contact them directly please call [phone].   It was a pleasure to see you today at our office.   Recommendations:  Neurocognitive evaluation at our office MRI of the brain, the radiology office will call you to arrange you appointment Referral to behavioral therapy  Follow up once the results of the above are available  No smoking   RECOMMENDATIONS FOR ALL PATIENTS WITH MEMORY PROBLEMS: 1. Continue to exercise (Recommend 30 minutes of walking everyday, or 3 hours every week) 2. Increase social interactions - continue going to [place] and enjoy social gatherings with friends and family 3. Eat healthy, avoid fried foods and eat more fruits and vegetables 4. Maintain adequate blood pressure, blood sugar, and blood cholesterol level. Reducing the risk of stroke and cardiovascular disease also helps promoting better memory. 5. Avoid stressful situations. Live a simple life and avoid aggravations. Organize your time and prepare for the next day in anticipation. 6. Sleep well, avoid any interruptions of sleep and avoid any distractions in the bedroom that may interfere with adequate sleep quality 7. Avoid sugar, avoid sweets as there is a strong link between excessive sugar intake, diabetes, and cognitive impairment We discussed the Mediterranean diet, which has been shown to help patients reduce the risk of progressive memory disorders and reduces cardiovascular risk. This includes eating fish, eat fruits and green leafy vegetables, nuts like almonds and hazelnuts, walnuts, and also use olive oil. Avoid fast foods and fried foods as much as possible. Avoid sweets and sugar as sugar use has been linked to worsening of memory function.  There is always a concern of gradual progression of memory problems. If this is the  case, then we may need to adjust level of care according to patient needs. Support, both to the patient and caregiver, should then be put into place.      You have been referred for a neuropsychological evaluation (i.e., evaluation of memory and thinking abilities). Please bring someone with you to this appointment if possible, as it is helpful for the doctor to hear from both you and another adult who knows you well. Please bring eyeglasses and hearing aids if you wear them.    The evaluation will take approximately 3 hours and has two parts:   The first part is a clinical interview with the neuropsychologist (Dr. Milbert Coulter or Dr. Roseanne Reno). During the interview, the neuropsychologist will speak with you and the individual you brought to the appointment.    The second part of the evaluation is testing with the doctor's technician Annabelle Harman or Selena Batten). During the testing, the technician will ask you to remember different types of material, solve problems, and answer some questionnaires. Your family member will not be present for this portion of the evaluation.   Please note: We must reserve several hours of the neuropsychologist's time and the psychometrician's time for your evaluation appointment. As such, there is a No-Show fee of $100. If you are unable to attend any of your appointments, please contact our office as soon as possible to reschedule.    FALL PRECAUTIONS: Be cautious when walking. Scan the area for obstacles that may increase the risk of trips and falls. When getting up in the mornings, sit up at the edge of the bed for a few minutes before getting out of bed.  Consider elevating the bed at the head end to avoid drop of blood pressure when getting up. Walk always in a well-lit room (use night lights in the walls). Avoid area rugs or power cords from appliances in the middle of the walkways. Use a walker or a cane if necessary and consider physical therapy for balance exercise. Get your eyesight  checked regularly.  FINANCIAL OVERSIGHT: Supervision, especially oversight when making financial decisions or transactions is also recommended.  HOME SAFETY: Consider the safety of the kitchen when operating appliances like stoves, microwave oven, and blender. Consider having supervision and share cooking responsibilities until no longer able to participate in those. Accidents with firearms and other hazards in the house should be identified and addressed as well.   ABILITY TO BE LEFT ALONE: If patient is unable to contact 911 operator, consider using LifeLine, or when the need is there, arrange for someone to stay with patients. Smoking is a fire hazard, consider supervision or cessation. Risk of wandering should be assessed by caregiver and if detected at any point, supervision and safe proof recommendations should be instituted.  MEDICATION SUPERVISION: Inability to self-administer medication needs to be constantly addressed. Implement a mechanism to ensure safe administration of the medications.   DRIVING: Regarding driving, in patients with progressive memory problems, driving will be impaired. We advise to have someone else do the driving if trouble finding directions or if minor accidents are reported. Independent driving assessment is available to determine safety of driving.   If you are interested in the driving assessment, you can contact the following:  [facility] [employer] [facility] in [place] [phone]  [facility] [phone]  [facility] [phone] or [phone]    Mediterranean Diet A Mediterranean diet refers to food and lifestyle choices that are based on the traditions of countries located on the [employer]. This way of eating has been shown to help prevent certain conditions and improve outcomes for people who have chronic diseases, like kidney disease and heart disease. What are tips for  following this plan? Lifestyle  Cook and eat meals together with your family, when possible. Drink enough fluid to keep your urine clear or pale yellow. Be physically active every day. This includes: Aerobic exercise like running or swimming. Leisure activities like gardening, walking, or housework. Get 7-8 hours of sleep each night. If recommended by your health care provider, drink red wine in moderation. This means 1 glass a day for nonpregnant women and 2 glasses a day for men. A glass of wine equals 5 oz (150 mL). Reading food labels  Check the serving size of packaged foods. For foods such as rice and pasta, the serving size refers to the amount of cooked product, not dry. Check the total fat in packaged foods. Avoid foods that have saturated fat or trans fats. Check the ingredients list for added sugars, such as corn syrup. Shopping  At the grocery store, buy most of your food from the areas near the walls of the store. This includes: Fresh fruits and vegetables (produce). Grains, beans, nuts, and seeds. Some of these may be available in unpackaged forms or large amounts (in bulk). Fresh seafood. Poultry and eggs. Low-fat dairy products. Buy whole ingredients instead of prepackaged foods. Buy fresh fruits and vegetables in-season from local farmers markets. Buy frozen fruits and vegetables in resealable bags. If you do not have access to quality fresh seafood, buy precooked frozen shrimp or canned fish, such as tuna,  salmon, or sardines. Buy small amounts of raw or cooked vegetables, salads, or olives from the deli or salad bar at your store. Stock your pantry so you always have certain foods on hand, such as olive oil, canned tuna, canned tomatoes, rice, pasta, and beans. Cooking  Cook foods with extra-virgin olive oil instead of using butter or other vegetable oils. Have meat as a side dish, and have vegetables or grains as your main dish. This means having meat in small portions  or adding small amounts of meat to foods like pasta or stew. Use beans or vegetables instead of meat in common dishes like chili or lasagna. Experiment with different cooking methods. Try roasting or broiling vegetables instead of steaming or sauteing them. Add frozen vegetables to soups, stews, pasta, or rice. Add nuts or seeds for added healthy fat at each meal. You can add these to yogurt, salads, or vegetable dishes. Marinate fish or vegetables using olive oil, lemon juice, garlic, and fresh herbs. Meal planning  Plan to eat 1 vegetarian meal one day each week. Try to work up to 2 vegetarian meals, if possible. Eat seafood 2 or more times a week. Have healthy snacks readily available, such as: Vegetable sticks with hummus. Greek yogurt. Fruit and nut trail mix. Eat balanced meals throughout the week. This includes: Fruit: 2-3 servings a day Vegetables: 4-5 servings a day Low-fat dairy: 2 servings a day Fish, poultry, or lean meat: 1 serving a day Beans and legumes: 2 or more servings a week Nuts and seeds: 1-2 servings a day Whole grains: 6-8 servings a day Extra-virgin olive oil: 3-4 servings a day Limit red meat and sweets to only a few servings a month What are my food choices? Mediterranean diet Recommended Grains: Whole-grain pasta. Brown rice. Bulgar wheat. Polenta. Couscous. Whole-wheat bread. Orpah Cobb. Vegetables: Artichokes. Beets. Broccoli. Cabbage. Carrots. Eggplant. Green beans. Chard. Kale. Spinach. Onions. Leeks. Peas. Squash. Tomatoes. Peppers. Radishes. Fruits: Apples. Apricots. Avocado. Berries. Bananas. Cherries. Dates. Figs. Grapes. Lemons. Melon. Oranges. Peaches. Plums. Pomegranate. Meats and other protein foods: Beans. Almonds. Sunflower seeds. Pine nuts. Peanuts. Cod. Salmon. Scallops. Shrimp. Tuna. Tilapia. Clams. Oysters. Eggs. Dairy: Low-fat milk. Cheese. Greek yogurt. Beverages: Water. Red wine. Herbal tea. Fats and oils: Extra virgin olive oil.  Avocado oil. Grape seed oil. Sweets and desserts: [place] yogurt with honey. Baked apples. Poached pears. Trail mix. Seasoning and other foods: Basil. Cilantro. Coriander. Cumin. Mint. Parsley. Sage. Rosemary. Tarragon. Garlic. Oregano. Thyme. Pepper. Balsalmic vinegar. Tahini. Hummus. Tomato sauce. Olives. Mushrooms. Limit these Grains: Prepackaged pasta or rice dishes. Prepackaged cereal with added sugar. Vegetables: Deep fried potatoes (french fries). Fruits: Fruit canned in syrup. Meats and other protein foods: Beef. Pork. Lamb. Poultry with skin. Hot dogs. Tomasa Blase. Dairy: Ice cream. Sour cream. Whole milk. Beverages: Juice. Sugar-sweetened soft drinks. Beer. Liquor and spirits. Fats and oils: Butter. Canola oil. Vegetable oil. Beef fat (tallow). Lard. Sweets and desserts: Cookies. Cakes. Pies. Candy. Seasoning and other foods: Mayonnaise. Premade sauces and marinades. The items listed may not be a complete list. Talk with your dietitian about what dietary choices are right for you. Summary The Mediterranean diet includes both food and lifestyle choices. Eat a variety of fresh fruits and vegetables, beans, nuts, seeds, and whole grains. Limit the amount of red meat and sweets that you eat. Talk with your health care provider about whether it is safe for you to drink red wine in moderation. This means 1 glass a day for nonpregnant women and  2 glasses a day for men. A glass of wine equals 5 oz (150 mL). This information is not intended to replace advice given to you by your health care provider. Make sure you discuss any questions you have with your health care provider. Document Released: 10/17/2015 Document Revised: 11/19/2015 Document Reviewed: 10/17/2015 Elsevier Interactive Patient Education  2017 [employer].

## 2020-12-30 ENCOUNTER — Encounter (HOSPITAL_BASED_OUTPATIENT_CLINIC_OR_DEPARTMENT_OTHER): Payer: Self-pay

## 2020-12-30 ENCOUNTER — Emergency Department (HOSPITAL_COMMUNITY)
Admission: EM | Admit: 2020-12-30 | Discharge: 2020-12-30 | Disposition: A | Discharge status: Home / Self Care | Payer: Managed Care, Other (non HMO) | Attending: Emergency Medicine | Admitting: Emergency Medicine

## 2020-12-30 ENCOUNTER — Encounter (HOSPITAL_COMMUNITY): Payer: Self-pay | Admitting: Emergency Medicine

## 2020-12-30 ENCOUNTER — Other Ambulatory Visit: Payer: Self-pay

## 2020-12-30 DIAGNOSIS — I10 Essential (primary) hypertension: Secondary | ICD-10-CM | POA: Insufficient documentation

## 2020-12-30 DIAGNOSIS — Z8616 Personal history of COVID-19: Secondary | ICD-10-CM | POA: Insufficient documentation

## 2020-12-30 DIAGNOSIS — Z79899 Other long term (current) drug therapy: Secondary | ICD-10-CM | POA: Diagnosis not present

## 2020-12-30 DIAGNOSIS — Z87891 Personal history of nicotine dependence: Secondary | ICD-10-CM | POA: Insufficient documentation

## 2020-12-30 DIAGNOSIS — R519 Headache, unspecified: Secondary | ICD-10-CM | POA: Insufficient documentation

## 2020-12-30 DIAGNOSIS — J320 Chronic maxillary sinusitis: Secondary | ICD-10-CM | POA: Insufficient documentation

## 2020-12-30 DIAGNOSIS — J321 Chronic frontal sinusitis: Secondary | ICD-10-CM | POA: Diagnosis not present

## 2020-12-30 DIAGNOSIS — J322 Chronic ethmoidal sinusitis: Secondary | ICD-10-CM | POA: Insufficient documentation

## 2020-12-30 DIAGNOSIS — Z5321 Procedure and treatment not carried out due to patient leaving prior to being seen by health care provider: Secondary | ICD-10-CM | POA: Insufficient documentation

## 2020-12-30 NOTE — ED Triage Notes (Addendum)
Patient c/o headache x2 days. Denies N/V, blurred. Denies weakness. Denies new head injury. Patient reports taking BC powder without relief.

## 2020-12-30 NOTE — ED Triage Notes (Signed)
Pt c/o HA x 2 days-pain to back of head-denies injury-NAD-steady gait-[facility] ED PTA

## 2020-12-31 ENCOUNTER — Emergency Department (HOSPITAL_BASED_OUTPATIENT_CLINIC_OR_DEPARTMENT_OTHER): Payer: Managed Care, Other (non HMO)

## 2020-12-31 ENCOUNTER — Emergency Department (HOSPITAL_BASED_OUTPATIENT_CLINIC_OR_DEPARTMENT_OTHER)
Admission: EM | Admit: 2020-12-31 | Discharge: 2020-12-31 | Disposition: A | Discharge status: Home / Self Care | Payer: Managed Care, Other (non HMO) | Attending: Emergency Medicine | Admitting: Emergency Medicine

## 2020-12-31 ENCOUNTER — Telehealth: Payer: Self-pay

## 2020-12-31 DIAGNOSIS — R519 Headache, unspecified: Secondary | ICD-10-CM

## 2020-12-31 IMAGING — CT CT HEAD W/O CM
3 series · 15 of 47 positions shown, 18 images · non-contrast
Comparison: None.

CLINICAL DATA: Headache x2 days.

EXAM:
CT HEAD WITHOUT CONTRAST
TECHNIQUE: Contiguous axial images were obtained from the base of the skull
through the vertex without intravenous contrast.

[Series 2: head wo · axial · 0.46mm/px · z∈[-122,+18]mm · 9 of 34 slices shown, 12 images]
[im 3/34  brain]
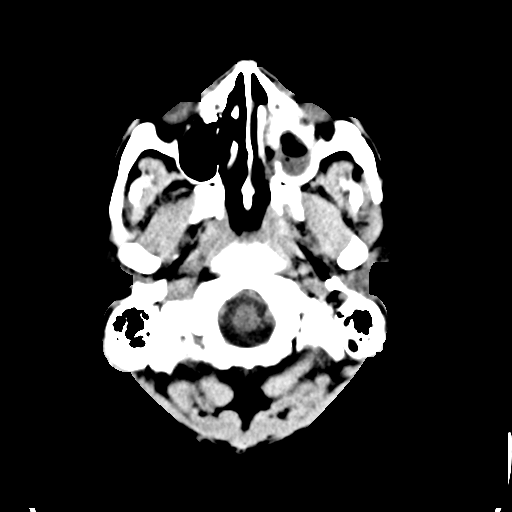
[im 3/34  bone]
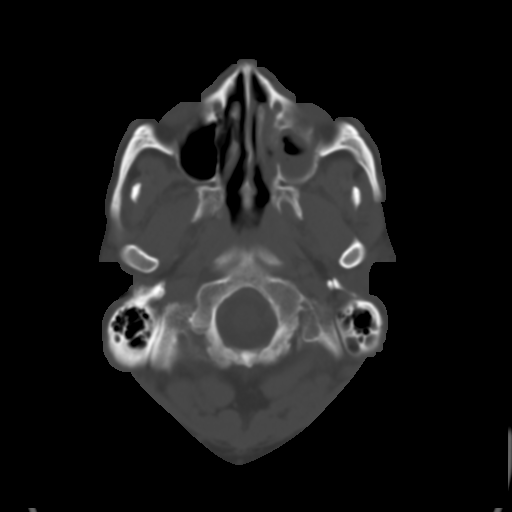
[im 6/34  brain]
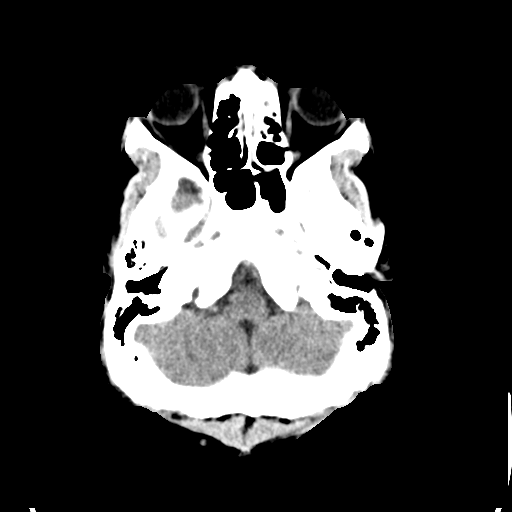
[im 10/34  brain]
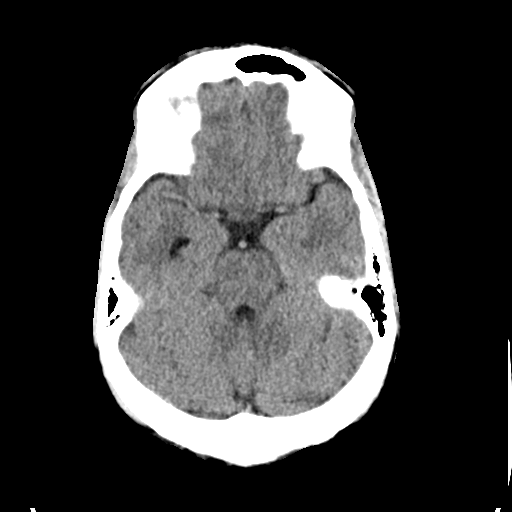
[im 13/34  brain]
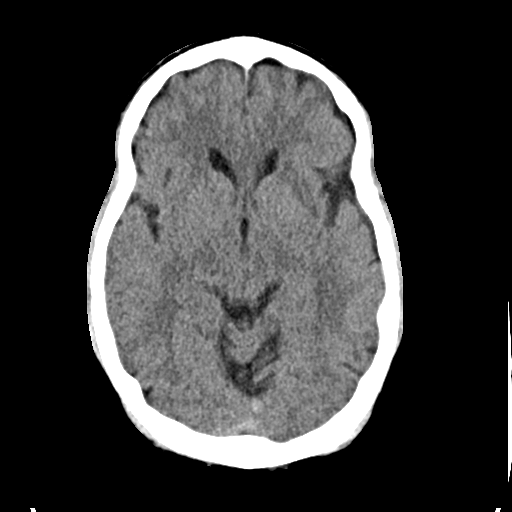
[im 18/34  brain]
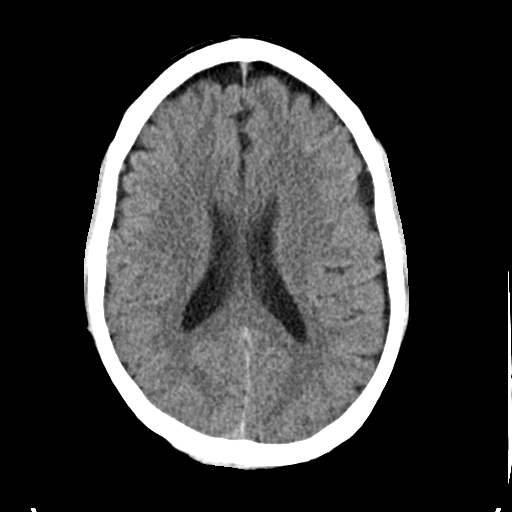
[im 18/34  bone]
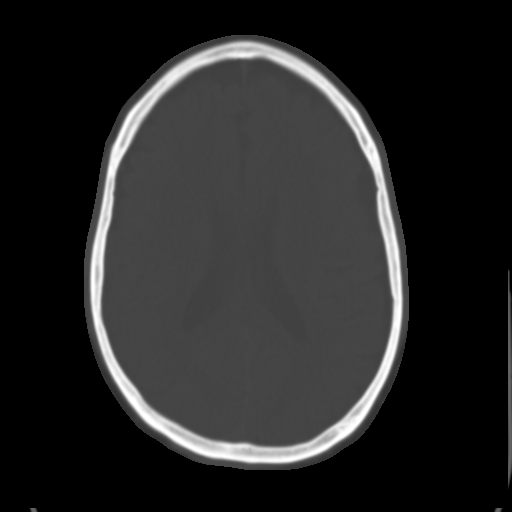
[im 21/34  brain]
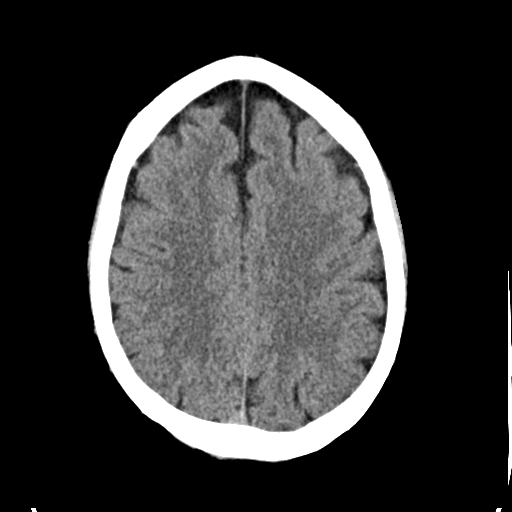
[im 24/34  brain]
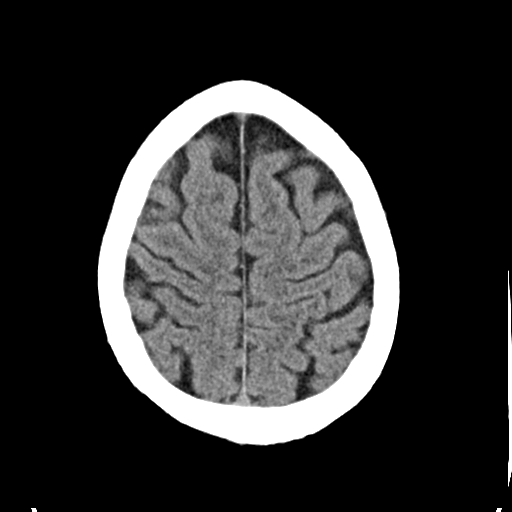
[im 28/34  brain]
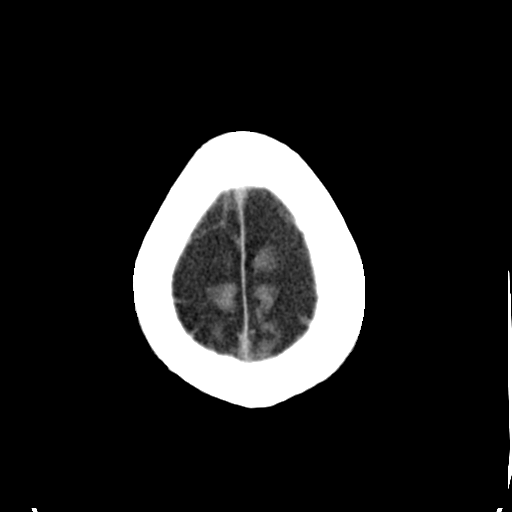
[im 31/34  brain]
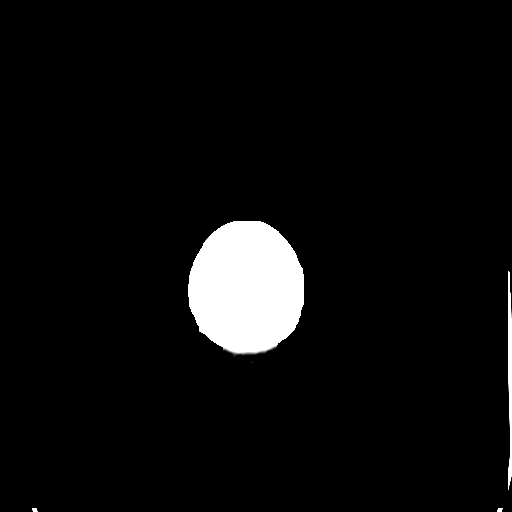
[im 31/34  bone]
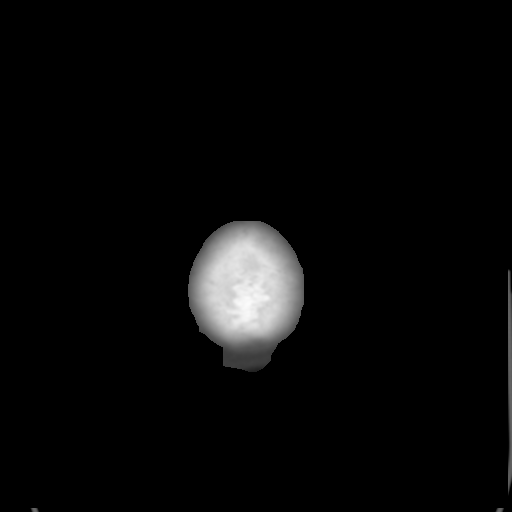

[Series 4: coronal soft · coronal · 0.33mm/px · 3 of 74 slices shown]
[im 25/74  brain]
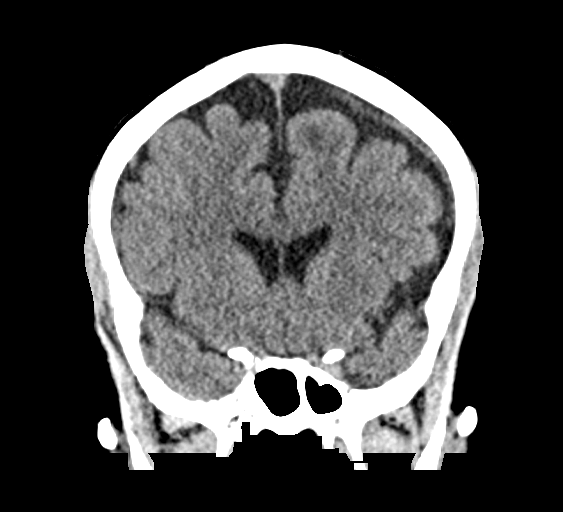
[im 33/74  brain]
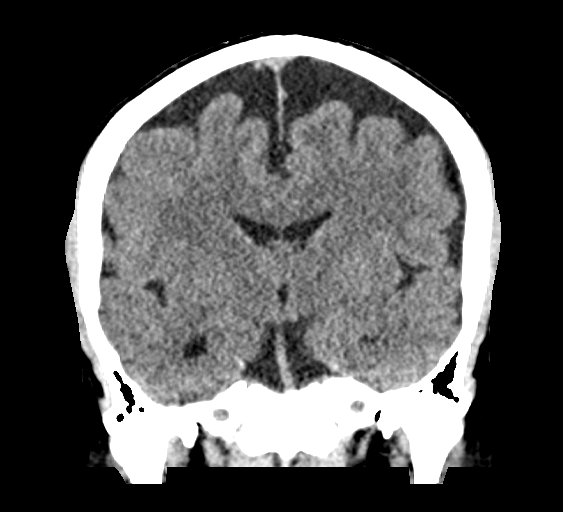
[im 41/74  brain]
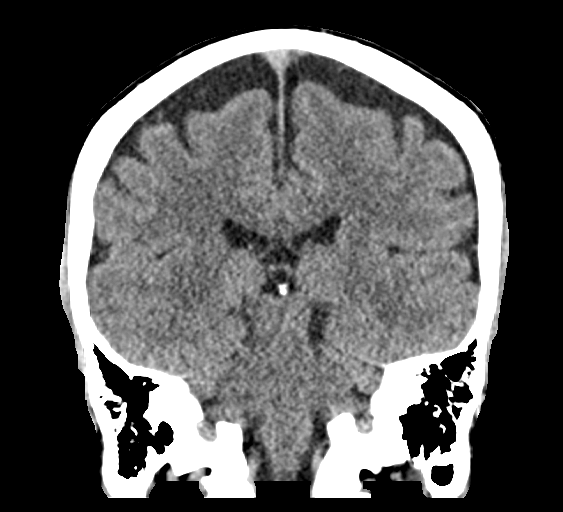

[Series 5: sag soft · sagittal · 0.33mm/px · 3 of 60 slices shown]
[im 20/60  brain]
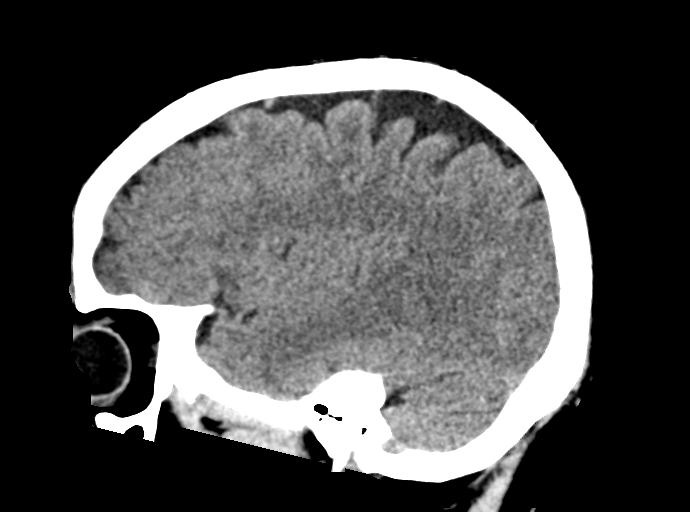
[im 30/60  brain]
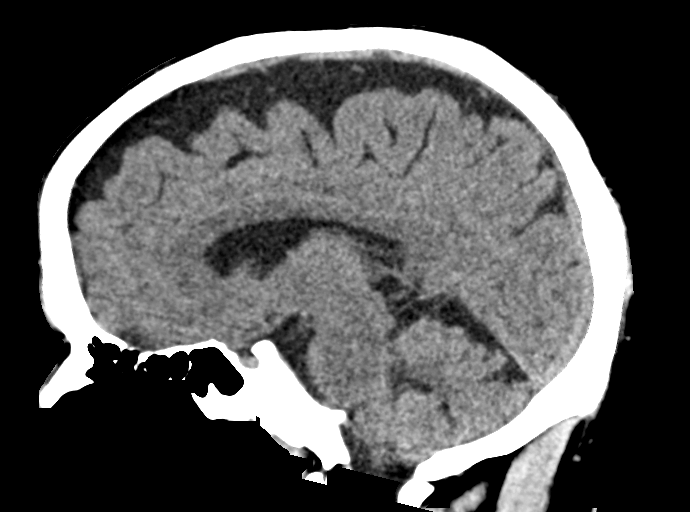
[im 40/60  brain]
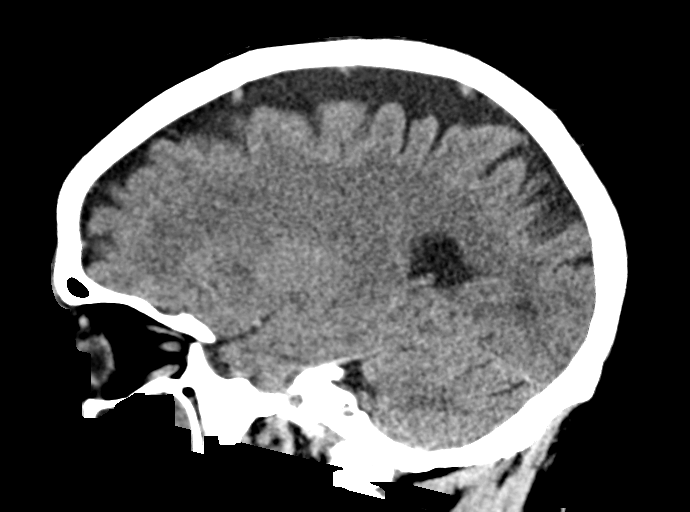

[15 of 47 positions shown; findings below may reference images not displayed]

FINDINGS: Brain: No evidence of acute infarction, hemorrhage, hydrocephalus,
extra-axial collection or mass lesion/mass effect.

Vascular: No hyperdense vessel or unexpected calcification.

Skull: Normal. Negative for fracture or focal lesion.

Sinuses/Orbits: There is marked severity left maxillary sinus and
left ethmoid sinus mucosal thickening. Mild frontal sinus mucosal
thickening is also noted.

Other: None.
IMPRESSION: 1. No acute intracranial abnormality.
2. Left maxillary sinus, left ethmoid sinus and frontal sinus
disease.

## 2020-12-31 MED ORDER — KETOROLAC TROMETHAMINE 60 MG/2ML IM SOLN
60.0000 mg | Freq: Once | INTRAMUSCULAR | Status: AC
  Administered 2020-12-31: 60 mg via INTRAMUSCULAR
  Filled 2020-12-31: qty 2

## 2020-12-31 MED ORDER — NAPROXEN 500 MG PO TABS: 500.0000 mg | ORAL_TABLET | Freq: Two times a day (BID) | ORAL | 0 refills | Status: DC

## 2020-12-31 NOTE — Telephone Encounter (Signed)
Caller states her husband has blood work in the morning. He has had headache since Saturday, still taking BC: Goodie Powder, it's not helping. Need to speak to someone about his headaches that are not relieved. Denies history of migraines. been back and forth to PCP & Neurologists.  Disp. Time Tyler Dickerson Time) Disposition Final User 12/30/2020 5:23:30 PM Go to ED Now (or PCP triage) Yes Newhart, RN, Beth Caller Disagree/Comply Comply Caller Understands Yes  Pt visited ED today & yesterday as recommended.

## 2020-12-31 NOTE — ED Provider Notes (Signed)
[facility] EMERGENCY DEPARTMENT Provider Note   CSN: [id number] Arrival date & time: 12/30/20  1900     History Chief Complaint  Patient presents with   Headache    Tyler Dickerson is a 55 y.o. male.   Headache  This patient is a 55 year old male with hypercholesterolemia, hypertension and a recent problem with memory loss who is currently seeing a neurologist for the memory loss and is scheduled to have an MRI on November 2.  Presents after having several days of headache, when they call the family doctor's office they told him to come to the emergency department for further evaluation.  There is no vomiting fevers chills nausea or vomiting - no blurred vision and no diarrhea. Had COVID approximately 2 months ago and though he completely healed from that that made some of the memory loss worse.  He has been taking aspirin containing over-the-counter products with some transient improvement in his headaches.  Past Medical History:  Diagnosis Date   Hypercholesteremia    Mild   Hyperplastic colon polyp    Hypertension     Patient Active Problem List   Diagnosis Date Noted   Atypical chest pain 11/03/2016   Dyslipidemia 11/03/2016   Family history of peripheral arterial disease 11/03/2016   Smoker 11/03/2016   Family history of abdominal aortic aneurysm (AAA) 11/03/2016    Past Surgical History:  Procedure Laterality Date   COLONOSCOPY  2015   REPLACEMENT TOTAL KNEE Left 1996       Family History  Problem Relation Age of Onset   Aneurysm Father    Supraventricular tachycardia Brother        tachycardia    Social History   Tobacco Use   Smoking status: Former    Packs/day: 0.50    Types: Cigarettes    Quit date: 03/09/2018    Years since quitting: 2.8   Smokeless tobacco: Never  Vaping Use   Vaping Use: Never used  Substance Use Topics   Alcohol use: Yes    Comment: occasional   Drug use: No    Home Medications Prior to Admission  medications   Medication Sig Start Date End Date Taking? Authorizing Provider  naproxen (NAPROSYN) 500 MG tablet Take 1 tablet (500 mg total) by mouth 2 (two) times daily with a meal. 12/31/20  Yes Eber Hong, MD  amLODipine (NORVASC) 5 MG tablet Take 5 mg by mouth daily. 12/03/20   [provider]  atorvastatin (LIPITOR) 20 MG tablet Take 1 tablet by mouth daily. 12/02/16   [provider]  cyclobenzaprine (FLEXERIL) 10 MG tablet Take 1 tablet (10 mg total) by mouth 2 (two) times daily as needed for muscle spasms. 04/23/12   Moreno-Coll, Adlih, MD  ibuprofen (ADVIL,MOTRIN) 600 MG tablet Take 1 tablet (600 mg total) by mouth every 8 (eight) hours as needed for pain. Take with meals 04/23/12   Moreno-Coll, Adlih, MD  traMADol (ULTRAM) 50 MG tablet Take 1 tablet (50 mg total) by mouth every 8 (eight) hours as needed for pain. Patient not taking: Reported on 12/25/2020 04/23/12   Moreno-Coll, Christin Fudge, MD    Allergies    Patient has no known allergies.  Review of Systems   Review of Systems  Neurological:  Positive for headaches.  All other systems reviewed and are negative.  Physical Exam Updated Vital Signs BP (!) 143/97 (BP Location: Right Arm)   Pulse 95   Temp 98.7 F (37.1 C) (Oral)   Resp 17  Ht 1.727 m (5\' 8" )   Wt 82.1 kg   SpO2 100%   BMI 27.52 kg/m   Physical Exam Vitals and nursing note reviewed.  Constitutional:      General: He is not in acute distress.    Appearance: He is well-developed.  HENT:     Head: Normocephalic and atraumatic.     Mouth/Throat:     Pharynx: No oropharyngeal exudate.  Eyes:     General: No scleral icterus.       Right eye: No discharge.        Left eye: No discharge.     Conjunctiva/sclera: Conjunctivae normal.     Pupils: Pupils are equal, round, and reactive to light.  Neck:     Thyroid: No thyromegaly.     Vascular: No JVD.  Cardiovascular:     Rate and Rhythm: Normal rate and regular rhythm.     Heart sounds:  Normal heart sounds. No murmur heard.   No friction rub. No gallop.  Pulmonary:     Effort: Pulmonary effort is normal. No respiratory distress.     Breath sounds: Normal breath sounds. No wheezing or rales.  Abdominal:     General: Bowel sounds are normal. There is no distension.     Palpations: Abdomen is soft. There is no mass.     Tenderness: There is no abdominal tenderness.  Musculoskeletal:        General: No tenderness. Normal range of motion.     Cervical back: Normal range of motion and neck supple.  Lymphadenopathy:     Cervical: No cervical adenopathy.  Skin:    General: Skin is warm and dry.     Findings: No erythema or rash.  Neurological:     Mental Status: He is alert.     Coordination: Coordination normal.     Comments: Speech is clear, cranial nerves III through XII are intact, memory is intact, strength is normal in all 4 extremities including grips, sensation is intact to light touch and pinprick in all 4 extremities. Coordination as tested by finger-nose-finger is normal, no limb ataxia. Normal gait, normal reflexes at the patellar tendons bilaterally  Psychiatric:        Behavior: Behavior normal.    ED Results / Procedures / Treatments   Labs (all labs ordered are listed, but only abnormal results are displayed) Labs Reviewed - No data to display  EKG None  Radiology CT Head Wo Contrast  Result Date: 12/31/2020 CLINICAL DATA:  Headache x2 days. EXAM: CT HEAD WITHOUT CONTRAST TECHNIQUE: Contiguous axial images were obtained from the base of the skull through the vertex without intravenous contrast. COMPARISON:  None. FINDINGS: Brain: No evidence of acute infarction, hemorrhage, hydrocephalus, extra-axial collection or mass lesion/mass effect. Vascular: No hyperdense vessel or unexpected calcification. Skull: Normal. Negative for fracture or focal lesion. Sinuses/Orbits: There is marked severity left maxillary sinus and left ethmoid sinus mucosal thickening.  Mild frontal sinus mucosal thickening is also noted. Other: None. IMPRESSION: 1. No acute intracranial abnormality. 2. Left maxillary sinus, left ethmoid sinus and frontal sinus disease. Electronically Signed   By: 01/02/2021 M.D.   On: 12/31/2020 01:25    Procedures Procedures   Medications Ordered in ED Medications  ketorolac (TORADOL) injection 60 mg (has no administration in time range)    ED Course  I have reviewed the triage vital signs and the nursing notes.  Pertinent labs & imaging results that were available during my care of the  patient were reviewed by me and considered in my medical decision making (see chart for details).    MDM Rules/Calculators/A&P                           Ultimately this patient appears to be in no distress, his spouse states that he is at his baseline except for the memory loss which has been progressive over the last couple of months, he has not had any focal neurologic deficits and she has not noticed any abnormal behavior at all.  His vital signs are unremarkable except for some minimal hypertension.  We will give a shot of ketorolac and check a CT scan of the brain to make sure there is no signs of underlying mass.  The patient and spouse are in agreement with the plan  The patient has no meningismus, no changes in mental status, no stiffness of the neck, no headaches, CT scan reveals no signs of hemorrhage, I personally viewed the images and I agree with the radiologist report and my interpretation is that there is no acute findings.  This patient is stable for discharge  Final Clinical Impression(s) / ED Diagnoses Final diagnoses:  Bad headache    Rx / DC Orders ED Discharge Orders          Ordered    naproxen (NAPROSYN) 500 MG tablet  2 times daily with meals        12/31/20 0135             Eber Hong, MD 12/31/20 [phone]

## 2020-12-31 NOTE — Discharge Instructions (Signed)
Your CT scan showed no acute findings - no tumors, no aneurysm  Please take Naprosyn, 500mg  by mouth twice daily as needed for pain - this in an antiinflammatory medicine (NSAID) and is similar to ibuprofen - many people feel that it is stronger than ibuprofen and it is easier to take since it is a smaller pill.  Please use this only for 1 week - if your pain persists, you will need to follow up with your doctor in the office for ongoing guidance and pain control.    You should stop taking aspiring containing products such as BC powders / Goody Powders  Thank you for letting take care of you today!  Please obtain all of your results from medical records or have your doctors office obtain the results - share them with your doctor - you should be seen at your doctors office in the next 2 days. Call today to arrange your follow up. Take the medications as prescribed. Please review all of the medicines and only take them if you do not have an allergy to them. Please be aware that if you are taking birth control pills, taking other prescriptions, ESPECIALLY ANTIBIOTICS may make the birth control ineffective - if this is the case, either do not engage in sexual activity or use alternative methods of birth control such as condoms until you have finished the medicine and your family doctor says it is OK to restart them. If you are on a blood thinner such as COUMADIN, be aware that any other medicine that you take may cause the coumadin to either work too much, or not enough - you should have your coumadin level rechecked in next 7 days if this is the case.  ?  It is also a possibility that you have an allergic reaction to any of the medicines that you have been prescribed - Everybody reacts differently to medications and while MOST people have no trouble with most medicines, you may have a reaction such as nausea, vomiting, rash, swelling, shortness of breath. If this is the case, please stop taking the  medicine immediately and contact your physician.   If you were given a medication in the ED such as percocet, vicodin, or morphine, be aware that these medicines are sedating and may change your ability to take care of yourself adequately for several hours after being given this medicines - you should not drive or take care of small children if you were given this medicine in the Emergency Department or if you have been prescribed these types of medicines. ?   You should return to the ER IMMEDIATELY if you develop severe or worsening symptoms.

## 2021-01-08 ENCOUNTER — Ambulatory Visit
Admission: RE | Admit: 2021-01-08 | Discharge: 2021-01-08 | Disposition: A | Discharge status: Home / Self Care | Payer: Managed Care, Other (non HMO) | Source: Ambulatory Visit | Attending: Physician Assistant | Admitting: Physician Assistant

## 2021-01-08 ENCOUNTER — Other Ambulatory Visit: Payer: Self-pay

## 2021-01-08 IMAGING — MR MR HEAD WO/W CM
13 series · 48 of 48 positions shown · IV contrast (multihance)
Comparison: Head CT December 31, 2020

CLINICAL DATA: Memory loss; history of covid brain; depression,
unspecified depression type.

EXAM:
MRI HEAD WITHOUT AND WITH CONTRAST
TECHNIQUE: Multiplanar, multiecho pulse sequences of the brain and surrounding
structures were obtained without and with intravenous contrast.
CONTRAST:  17mL MULTIHANCE GADOBENATE DIMEGLUMINE 529 MG/ML IV SOLN

[Series 5: T1 · sagittal · 4.0mm · 0.75mm/px · 1 of 31 slices shown (1 of 3)]
[im 1/31]
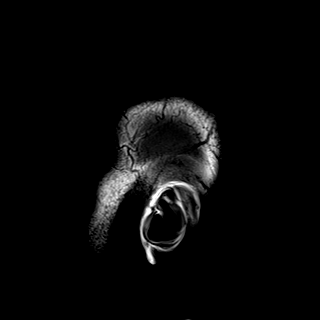

[Series 6: DWI · axial · 3.0mm · 0.94mm/px · z∈[-65,+90]mm · 9 of 176 slices shown (1 of 3)]
[im 1/176]
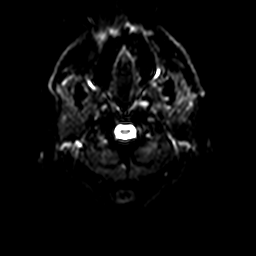
[im 22/176]
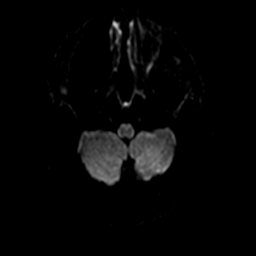
[im 44/176]
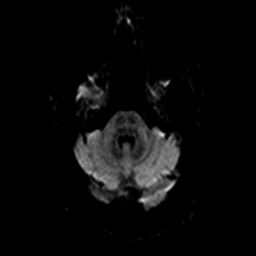
[im 66/176]
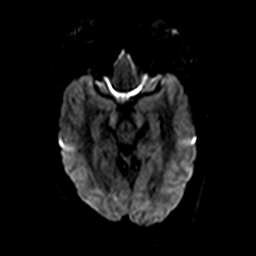
[im 88/176]
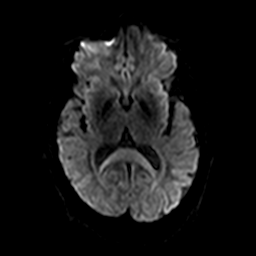
[im 110/176]
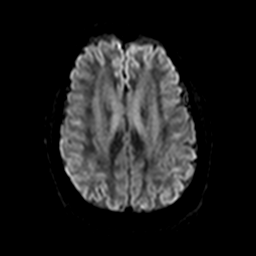
[im 132/176]
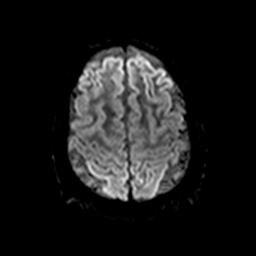
[im 154/176]
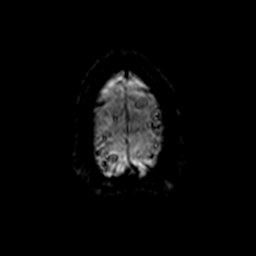
[im 176/176]
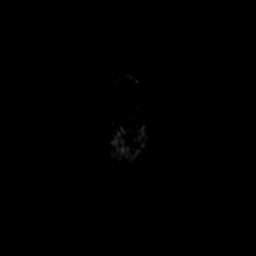

[Series 7: ax dwi_tracew · axial · 3.0mm · 0.94mm/px · z∈[-65,+90]mm · 4 of 88 slices shown]
[im 1/88]
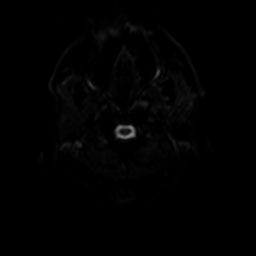
[im 30/88]
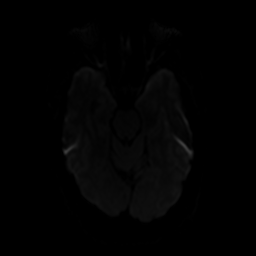
[im 59/88]
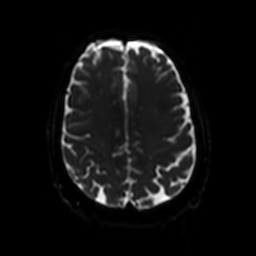
[im 88/88]
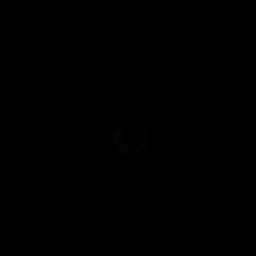

[Series 8: ax dwi_adc · axial · 3.0mm · 0.94mm/px · z∈[-65,+90]mm · 2 of 43 slices shown]
[im 1/43]
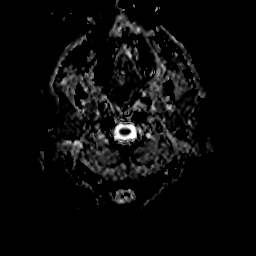
[im 43/43]
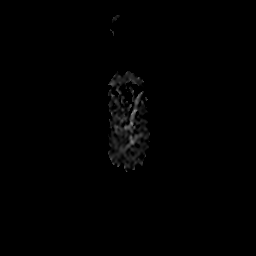

[Series 9: DWI · coronal · 5.0mm · 1.44mm/px · 3 of 68 slices shown (2 of 3)]
[im 1/68]
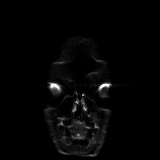
[im 34/68]
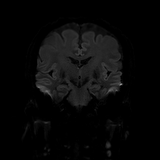
[im 68/68]
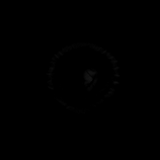

[Series 10: DWI · coronal · 5.0mm · 1.44mm/px · 2 of 34 slices shown (3 of 3)]
[im 1/34]
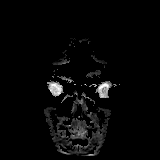
[im 34/34]
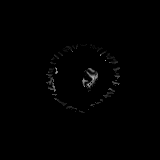

[Series 11: T2 · axial · 4.0mm · 0.36mm/px · 1 of 29 slices shown]
[im 1/29]
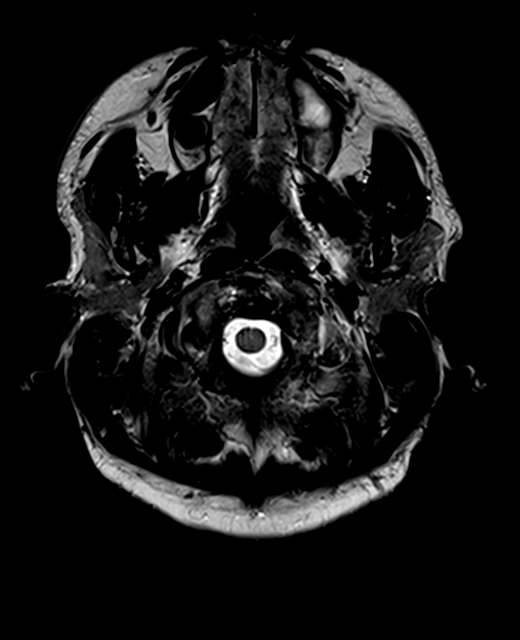

[Series 12: FLAIR · axial · 3.0mm · 0.72mm/px · 1 of 26 slices shown]
[im 1/26]
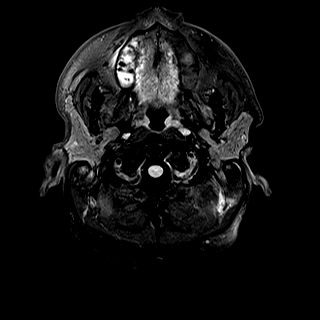

[Series 13: swi_images · axial · 1.5mm · 0.90mm/px · z∈[-57,+85]mm · 5 of 96 slices shown]
[im 1/96]
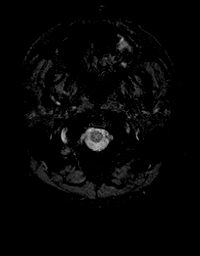
[im 24/96]
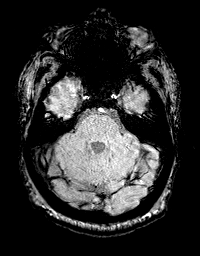
[im 48/96]
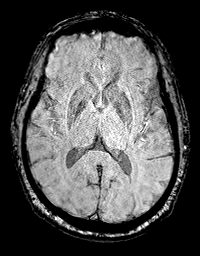
[im 72/96]
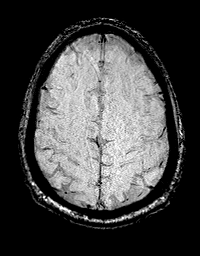
[im 96/96]
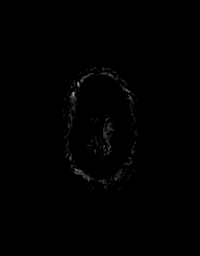

[Series 15: T1 · axial · 1.0mm · 0.94mm/px · z∈[-68,+90]mm · 8 of 160 slices shown (2 of 3)]
[im 1/160]
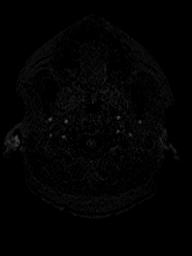
[im 23/160]
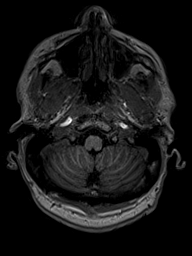
[im 46/160]
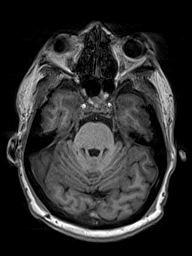
[im 69/160]
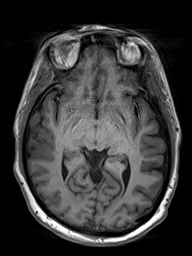
[im 91/160]
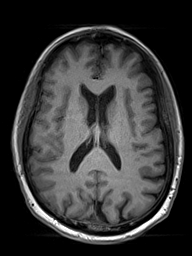
[im 114/160]
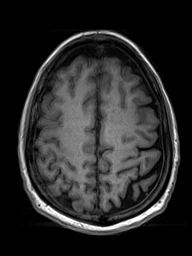
[im 137/160]
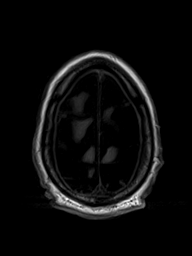
[im 160/160]
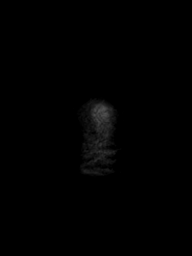

[Series 16: T2 post-contrast · coronal · 4.5mm · 0.36mm/px · 2 of 35 slices shown]
[im 1/35]
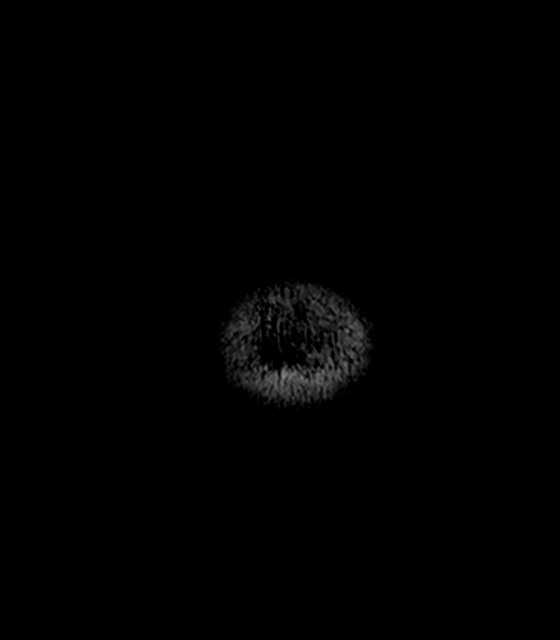
[im 35/35]
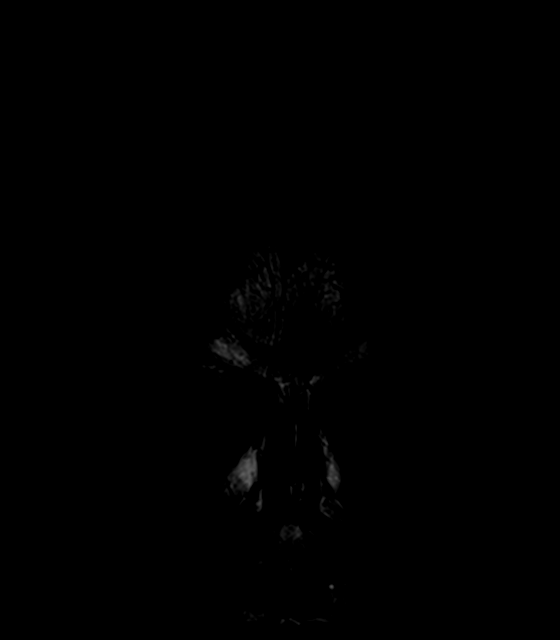

[Series 17: T1 · axial · 1.0mm · 0.94mm/px · z∈[-68,+90]mm · 8 of 160 slices shown (3 of 3)]
[im 1/160]
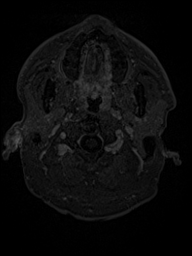
[im 23/160]
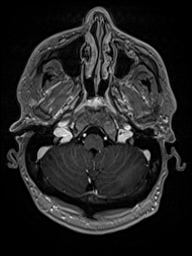
[im 46/160]
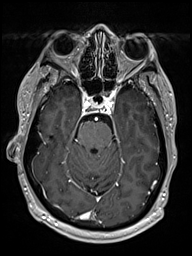
[im 69/160]
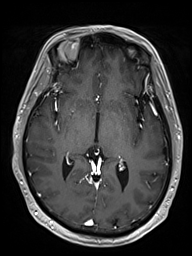
[im 91/160]
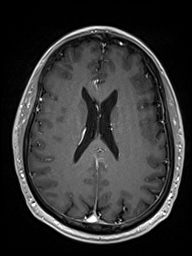
[im 114/160]
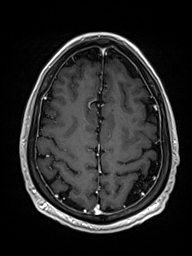
[im 137/160]
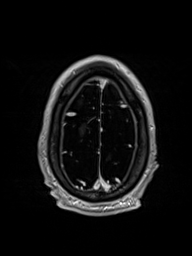
[im 160/160]
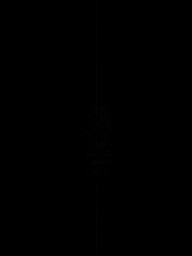

[Series 18: T1 post-contrast · coronal · 4.5mm · 0.72mm/px · 2 of 35 slices shown]
[im 1/35]
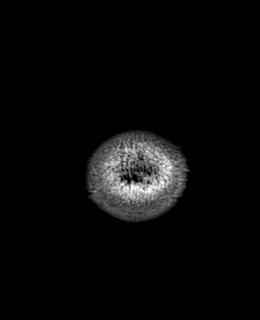
[im 35/35]
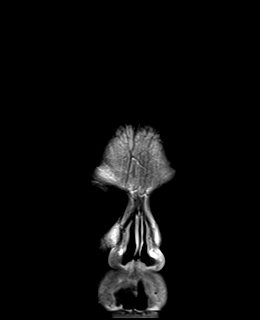

[48 of 48 positions shown; findings below may reference images not displayed]

FINDINGS: Brain: No acute infarction, hemorrhage, hydrocephalus, extra-axial
collection or mass lesion. The brain parenchyma has normal
morphology and signal characteristics. No focus of abnormal contrast
enhancement.

Vascular: Normal flow voids.

Skull and upper cervical spine: Normal marrow signal.

Sinuses/Orbits: Mucosal thickening of the left maxillary sinus.
Orbits are maintained.

Other: None.
IMPRESSION: 1. Unremarkable MRI of the brain.
2. Left maxillary sinus disease.

## 2021-01-08 MED ORDER — GADOBENATE DIMEGLUMINE 529 MG/ML IV SOLN
17.0000 mL | Freq: Once | INTRAVENOUS | Status: AC | PRN
Start: 2021-01-08 — End: 2021-01-08
  Administered 2021-01-08: 17 mL via INTRAVENOUS

## 2021-02-14 ENCOUNTER — Ambulatory Visit: Payer: Managed Care, Other (non HMO) | Attending: Family Medicine | Admitting: Physical Therapy

## 2021-02-14 ENCOUNTER — Encounter: Payer: Self-pay | Admitting: Physical Therapy

## 2021-02-14 ENCOUNTER — Other Ambulatory Visit: Payer: Self-pay

## 2021-02-14 DIAGNOSIS — M6281 Muscle weakness (generalized): Secondary | ICD-10-CM | POA: Diagnosis present

## 2021-02-14 DIAGNOSIS — M25652 Stiffness of left hip, not elsewhere classified: Secondary | ICD-10-CM | POA: Insufficient documentation

## 2021-02-14 DIAGNOSIS — M25651 Stiffness of right hip, not elsewhere classified: Secondary | ICD-10-CM | POA: Insufficient documentation

## 2021-02-14 DIAGNOSIS — M25551 Pain in right hip: Secondary | ICD-10-CM | POA: Diagnosis present

## 2021-02-14 NOTE — Therapy (Signed)
[facility] [address] [place], [place], [address] Phone: [phone]   Fax:  [phone]  Physical Therapy Evaluation  Patient Details  Name: Tyler Dickerson MRN: [id number] Date of Birth: [birth year] Referring Provider (PT): Dr Darrow Bussing   Encounter Date: 02/14/2021   PT End of Session - 02/14/21 1354     Visit Number 1    Date for PT Re-Evaluation 04/11/21    Authorization Type [employer]    PT Start Time 1015    PT Stop Time 1100    PT Time Calculation (min) 45 min    Activity Tolerance Patient tolerated treatment well             Past Medical History:  Diagnosis Date   Hypercholesteremia    Mild   Hyperplastic colon polyp    Hypertension     Past Surgical History:  Procedure Laterality Date   COLONOSCOPY  2015   REPLACEMENT TOTAL KNEE Left 1996    There were no vitals filed for this visit.    Subjective Assessment - 02/14/21 1022     Subjective My hip gets sore and hard to move around.  Hard to lift things.  Started 2 months ago.  No apparent reason. Off/on.    Pertinent History 1997 knee surgery didn't heal right    Limitations House hold activities;Lifting;Walking    How long can you sit comfortably? as long as I want but sharp pain on rising    How long can you walk comfortably? everyone says I walk slowly;  50 feet    Diagnostic tests none    Patient Stated Goals get rid of some of the pain;  I like to play with weights  a little bit; I'd like to pick up my grandson 57 years old    Currently in Pain? Yes    Pain Score 0-No pain    Pain Location Hip    Pain Orientation Right;Lateral    Pain Type Chronic pain    Pain Radiating Towards no peripheral symptoms    Pain Onset More than a month ago    Pain Frequency Intermittent    Aggravating Factors  lifting;  bending at work (works for [employer])  rising after sitting a while;  up and down stairs; stopped bowling    Pain Relieving Factors being  still                [facility] PT Assessment - 02/14/21 0001       Assessment   Medical Diagnosis right hip pain    Referring Provider (PT) Dr Carilyn Goodpasture Koirala    Onset Date/Surgical Date --   2 months   Next MD Visit as needed    Prior Therapy 1997 for knee      Precautions   Precautions None      Restrictions   Weight Bearing Restrictions No      Balance Screen   Has the patient fallen in the past 6 months No    Has the patient had a decrease in activity level because of a fear of falling?  No    Is the patient reluctant to leave their home because of a fear of falling?  No      Home Environment   Living Environment Private residence    Type of Home House    Home Access Stairs to enter    Entrance Stairs-Number of Steps 4    Home Layout One level  Additional Comments parents home has basement steps      Prior Function   Level of Independence Independent    Vocation Full time employment    Vocation Requirements work at [employer] used to bowl; walking; go to park      Observation/Other Assessments   Focus on Therapeutic Outcomes (FOTO)  49%      Posture/Postural Control   Posture/Postural Control Postural limitations    Postural Limitations Decreased lumbar lordosis      AROM   Overall AROM Comments seated limited hip external rotation 25%;  single leg stand 10 sec right/left with mild pelvic drop on right    Right Hip Flexion 90    Right Hip External Rotation  40    Right Hip Internal Rotation  5    Left Hip Flexion 90    Left Hip External Rotation  45    Left Hip Internal Rotation  5    Lumbar Flexion 50    Lumbar Extension 10    Lumbar - Right Side Bend 15      PROM   Overall PROM Comments dec bil hip internal rotation 5 degrees      Strength   Right Hip Extension 4+/5    Right Hip ABduction 4/5    Left Hip Extension 4+/5    Left Hip ABduction 4/5    Lumbar Flexion 4/5    Lumbar Extension 4/5      Flexibility   Hamstrings 65 on right; 60  left    Quadriceps bil dec in hip flexor lengths      Palpation   Palpation comment tender points in gluteals on rigth                        Objective measurements completed on examination: See above findings.                PT Education - 02/14/21 1353     Education Details quadruped rocking; SKTC; supine piriformis stretch; supine lumbar rotation    Person(s) Educated Patient    Methods Explanation;Demonstration;Handout    Comprehension Returned demonstration;Verbalized understanding              PT Short Term Goals - 02/14/21 1405       PT SHORT TERM GOAL #1   Title The patient will demonstrate basic self care strategies and initial HEP    Time 4    Period Weeks    Status New    Target Date 03/14/21      PT SHORT TERM GOAL #2   Title The patient will report a 40% improvement in right lateral hip pain    Time 4    Period Weeks    Status New      PT SHORT TERM GOAL #3   Title Improved lumbar flexion to 60 degrees and hip flexion to 110 degrees for greater ease with lifting    Time 4    Period Weeks    Status New      PT SHORT TERM GOAL #4   Title Improved  HS length to 70 degrees and hip flexor length to 10 degrees for improved mobility with work tasks/lifting    Time 4    Period Weeks    Status New      PT SHORT TERM GOAL #5   Title Hip external rotation to 45 degrees on right needed for greater ease putting on socks/shoes  Time 4    Period Weeks    Status New               PT Long Term Goals - 02/14/21 1410       PT LONG TERM GOAL #1   Title The patient will be independent with HEP and will return to lifting his home weights    Time 8    Period Weeks    Status New    Target Date 04/11/21      PT LONG TERM GOAL #2   Title The patient will report a 70% improvement in right hip pain with home and work ADLs    Time 8    Period Weeks    Status New      PT LONG TERM GOAL #3   Title Improved bil hip and trunk  strength to grossly 4+/5 to 5/5 needed for lifting heavier weights    Time 8    Period Weeks    Status New      PT LONG TERM GOAL #4   Title The patient will have improved lumbopelvic and hip mobility for greater ease ascending and descending his parents basement steps and possible return to bowling    Time 8    Period Weeks    Status New      PT LONG TERM GOAL #5   Title FOTO score improved from 49% to 71%    Time 8    Period Weeks    Status New                    Plan - 02/14/21 1354     Clinical Impression Statement The patient reports a 2 month history of right lateral hip pain produced with lifting, bending at work (job with [employer]), up and down stairs and rising after sitting a while.  He demonstrates significant stiffness with lumbar mobility in all directions.  Hip mobility is limited bilaterally especially with internal and external rotation.  Shortened muscle lengths particularly hip flexors and HS.  Decreased strength noted in hip abductors and extensors as well as abdominals and back extensors.  No neural signs.  Tender points in right gluteals.  He would benefit from PT to address these deficits.    Personal Factors and Comorbidities Profession    Examination-Activity Limitations Lift;Stairs;Squat;Transfers    Examination-Participation Restrictions Occupation;Community Activity;Interpersonal Relationship    Stability/Clinical Decision Making Stable/Uncomplicated    Clinical Decision Making Low    Rehab Potential Good    PT Frequency 2x / week    PT Duration 8 weeks    PT Treatment/Interventions ADLs/Self Care Home Management;Aquatic Therapy;Cryotherapy;Electrical Stimulation;Ultrasound;Moist Heat;Iontophoresis 4mg /ml Dexamethasone;Traction;Therapeutic activities;Therapeutic exercise;Neuromuscular re-education;Manual techniques;Patient/family education;Dry needling;Taping;Spinal Manipulations    PT Next Visit Plan review initial HEP and add hip flexor and HS  stretching;  hip internal and external rotation ROM; hip mobilizations, Addaday to gluteals (he is not keen on DN at this time); lumbo/pelvic/hip strengthening; possible ionto if tender over greater trochanteric bursa             Patient will benefit from skilled therapeutic intervention in order to improve the following deficits and impairments:  Decreased range of motion, Increased fascial restricitons, Pain, Decreased strength, Impaired flexibility, Hypomobility  Visit Diagnosis: Pain in right hip - Plan: PT plan of care cert/re-cert  Stiffness of right hip, not elsewhere classified - Plan: PT plan of care cert/re-cert  Stiffness of left hip, not elsewhere classified - Plan: PT plan of care  cert/re-cert  Muscle weakness (generalized) - Plan: PT plan of care cert/re-cert     Problem List Patient Active Problem List   Diagnosis Date Noted   Atypical chest pain 11/03/2016   Dyslipidemia 11/03/2016   Family history of peripheral arterial disease 11/03/2016   Smoker 11/03/2016   Family history of abdominal aortic aneurysm (AAA) 11/03/2016   Lavinia Sharps, PT 02/14/21 2:19 PM Phone: [phone] Fax: [phone]  Vivien Presto, PT 02/14/2021, 2:19 PM  [facility] @ [facility] [address] [place], [place], [address] Phone: [phone]   Fax:  [phone]  Name: KEISHON CHAVARIN MRN: [id number] Date of Birth: [birth year]

## 2021-02-14 NOTE — Patient Instructions (Signed)
Access Code: [email or web address] URL: [email or web address] Date: 02/14/2021 Prepared by: Lavinia Sharps  Exercises Quadruped Rocking Backward - 1 x daily - 7 x weekly - 1 sets - 10 reps Supine Lower Trunk Rotation - 1 x daily - 7 x weekly - 1 sets - 10 reps Supine Single Knee to Chest Stretch - 1 x daily - 7 x weekly - 1 sets - 5 reps Supine Figure 4 Piriformis Stretch - 1 x daily - 7 x weekly - 1 sets - 5 reps

## 2021-02-18 ENCOUNTER — Other Ambulatory Visit: Payer: Self-pay

## 2021-02-18 ENCOUNTER — Ambulatory Visit: Payer: Managed Care, Other (non HMO) | Admitting: Physical Therapy

## 2021-02-18 ENCOUNTER — Encounter: Payer: Self-pay | Admitting: Physical Therapy

## 2021-02-18 DIAGNOSIS — M25551 Pain in right hip: Secondary | ICD-10-CM

## 2021-02-18 DIAGNOSIS — M6281 Muscle weakness (generalized): Secondary | ICD-10-CM

## 2021-02-18 DIAGNOSIS — M25651 Stiffness of right hip, not elsewhere classified: Secondary | ICD-10-CM

## 2021-02-18 NOTE — Patient Instructions (Signed)
Access Code: [email or web address] URL: [email or web address] Date: 02/18/2021 Prepared by: Loistine Simas Heinrich Fertig  Exercises Quadruped Rocking Backward - 1 x daily - 7 x weekly - 1 sets - 10 reps Supine Lower Trunk Rotation - 1 x daily - 7 x weekly - 1 sets - 10 reps Supine Single Knee to Chest Stretch - 1 x daily - 7 x weekly - 1 sets - 5 reps Supine Figure 4 Piriformis Stretch - 1 x daily - 7 x weekly - 1 sets - 5 reps Seated Hamstring Stretch - 1 x daily - 7 x weekly - 1 sets - 2 reps - 20 hold Standing Hip Flexor Stretch - 1 x daily - 7 x weekly - 1 sets - 2 reps - 20 hold

## 2021-02-18 NOTE — Therapy (Signed)
[facility] [address] [place], [place], [address] Phone: [phone]   Fax:  [phone]  Physical Therapy Treatment  Patient Details  Name: Tyler Dickerson MRN: [id number] Date of Birth: Apr 21, 1965 Referring Provider (PT): Dr Darrow Bussing   Encounter Date: 02/18/2021   PT End of Session - 02/18/21 1144     Visit Number 2    Date for PT Re-Evaluation 04/11/21    Authorization Type [employer]    PT Start Time 1100    PT Stop Time 1144    PT Time Calculation (min) 44 min    Activity Tolerance Patient tolerated treatment well    Behavior During Therapy [facility] for tasks assessed/performed             Past Medical History:  Diagnosis Date   Hypercholesteremia    Mild   Hyperplastic colon polyp    Hypertension     Past Surgical History:  Procedure Laterality Date   COLONOSCOPY  2015   REPLACEMENT TOTAL KNEE Left 1996    There were no vitals filed for this visit.   Subjective Assessment - 02/18/21 1104     Subjective Pain is pretty good today.  I am doing  my stretches.    Pertinent History 1997 knee surgery didn't heal right    Limitations House hold activities;Lifting;Walking    How long can you sit comfortably? as long as I want but sharp pain on rising    How long can you walk comfortably? everyone says I walk slowly;  50 feet    Diagnostic tests none    Patient Stated Goals get rid of some of the pain;  I like to play with weights  a little bit; I'd like to pick up my grandson 81 years old    Currently in Pain? Yes    Pain Score 3     Pain Location Hip    Pain Orientation Right;Lateral    Pain Type Chronic pain                               [facility] Adult PT Treatment/Exercise - 02/18/21 0001       Exercises   Exercises Lumbar;Knee/Hip      Lumbar Exercises: Stretches   Active Hamstring Stretch Right;Left;2 reps;20 seconds    Active Hamstring Stretch Limitations bil, seated on  mat table    Single Knee to Chest Stretch Right;Left;5 reps    Single Knee to Chest Stretch Limitations alt Lt/Rt A/ROM    Lower Trunk Rotation Limitations 10 reps A/ROM Lt/Rt    Hip Flexor Stretch Left;Right;2 reps;20 seconds    Hip Flexor Stretch Limitations in doorframe, reach up and over    Piriformis Stretch Right;20 seconds;1 rep    Piriformis Stretch Limitations hooklying    Figure 4 Stretch With overpressure;20 seconds    Figure 4 Stretch Limitations Rt    Other Lumbar Stretch Exercise bil hip windshiled wipers x 10 A/ROM      Lumbar Exercises: Aerobic   Recumbent Bike L2 x 4' PT present to monitor and discuss pain      Knee/Hip Exercises: Standing   Forward Step Up Both;1 set;10 reps;Step Height: 6";Hand Hold: 2    Forward Step Up Limitations march to 2nd step, VC for tall lumbar posture and glut activation      Knee/Hip Exercises: Seated   Sit to Sand 10 reps;without UE support  hip hinge technique, Pt reported no catching, chair + pad     Knee/Hip Exercises: Prone   Other Prone Exercises quad stretch, passive by PT, Rt, 1x30 sec, signif restriction      Manual Therapy   Manual Therapy Soft tissue mobilization    Soft tissue mobilization Addaday assisted STM to Rt gluteals, proximal quads/hip flexors                     PT Education - 02/18/21 1141     Education Details added hip flexor and hamstring strech    Person(s) Educated Patient    Methods Explanation;Handout    Comprehension Verbalized understanding;Returned demonstration              PT Short Term Goals - 02/14/21 1405       PT SHORT TERM GOAL #1   Title The patient will demonstrate basic self care strategies and initial HEP    Time 4    Period Weeks    Status New    Target Date 03/14/21      PT SHORT TERM GOAL #2   Title The patient will report a 40% improvement in right lateral hip pain    Time 4    Period Weeks    Status New      PT SHORT TERM GOAL #3   Title Improved  lumbar flexion to 60 degrees and hip flexion to 110 degrees for greater ease with lifting    Time 4    Period Weeks    Status New      PT SHORT TERM GOAL #4   Title Improved  HS length to 70 degrees and hip flexor length to 10 degrees for improved mobility with work tasks/lifting    Time 4    Period Weeks    Status New      PT SHORT TERM GOAL #5   Title Hip external rotation to 45 degrees on right needed for greater ease putting on socks/shoes    Time 4    Period Weeks    Status New               PT Long Term Goals - 02/14/21 1410       PT LONG TERM GOAL #1   Title The patient will be independent with HEP and will return to lifting his home weights    Time 8    Period Weeks    Status New    Target Date 04/11/21      PT LONG TERM GOAL #2   Title The patient will report a 70% improvement in right hip pain with home and work ADLs    Time 8    Period Weeks    Status New      PT LONG TERM GOAL #3   Title Improved bil hip and trunk strength to grossly 4+/5 to 5/5 needed for lifting heavier weights    Time 8    Period Weeks    Status New      PT LONG TERM GOAL #4   Title The patient will have improved lumbopelvic and hip mobility for greater ease ascending and descending his parents basement steps and possible return to bowling    Time 8    Period Weeks    Status New      PT LONG TERM GOAL #5   Title FOTO score improved from 49% to 71%    Time 8    Period Weeks  Status New                   Plan - 02/18/21 1144     Clinical Impression Statement Pt returns for first follow up since evaluation.  He has been compliant with initial stretches.  He continues to have limited motion of bil hips Rt>Lt.  He has signif LE flexibility restrictions so HEP reviewed and progressed to include hip flexor and hamstring stretch.  Addaday assisted STM to gluteals and proximal anterior thigh on Rt used today with Pt report end of session looser Rt hip.  He has able to  demo reps of standing march and sit to stand without hip pain today and good form.  He will benefit from progression of strengthening next visit.    PT Frequency 2x / week    PT Duration 8 weeks    PT Treatment/Interventions ADLs/Self Care Home Management;Aquatic Therapy;Cryotherapy;Electrical Stimulation;Ultrasound;Moist Heat;Iontophoresis 4mg /ml Dexamethasone;Traction;Therapeutic activities;Therapeutic exercise;Neuromuscular re-education;Manual techniques;Patient/family education;Dry needling;Taping;Spinal Manipulations    PT Next Visit Plan updated HEP for hip strength next time, review HEP especially hip flexor stretching, continue Addaday assisted STM (no DN at this time)    PT Home Exercise Plan Access Code: [email or web address]    Consulted and Agree with Plan of Care Patient             Patient will benefit from skilled therapeutic intervention in order to improve the following deficits and impairments:     Visit Diagnosis: Pain in right hip  Stiffness of right hip, not elsewhere classified  Muscle weakness (generalized)     Problem List Patient Active Problem List   Diagnosis Date Noted   Atypical chest pain 11/03/2016   Dyslipidemia 11/03/2016   Family history of peripheral arterial disease 11/03/2016   Smoker 11/03/2016   Family history of abdominal aortic aneurysm (AAA) 11/03/2016    11/05/2016, PT 02/18/21 11:47 AM   [facility] [address] [place], [place], [place] Phone: [phone]   Fax:  [phone]  Name: Tyler Dickerson MRN: Dareen Piano Date of Birth: 03/29/1965

## 2021-02-20 ENCOUNTER — Other Ambulatory Visit: Payer: Self-pay

## 2021-02-20 ENCOUNTER — Ambulatory Visit: Payer: Managed Care, Other (non HMO)

## 2021-02-20 DIAGNOSIS — M25651 Stiffness of right hip, not elsewhere classified: Secondary | ICD-10-CM

## 2021-02-20 DIAGNOSIS — M25551 Pain in right hip: Secondary | ICD-10-CM | POA: Diagnosis not present

## 2021-02-20 DIAGNOSIS — M25652 Stiffness of left hip, not elsewhere classified: Secondary | ICD-10-CM

## 2021-02-20 DIAGNOSIS — M6281 Muscle weakness (generalized): Secondary | ICD-10-CM

## 2021-02-20 NOTE — Patient Instructions (Signed)
Focus on stretching on days when you are having more acute pain.

## 2021-02-20 NOTE — Therapy (Signed)
[facility] [address] [place], [place], [address] Phone: [phone]   Fax:  [phone]  Physical Therapy Treatment  Patient Details  Name: Tyler Dickerson MRN: [id number] Date of Birth: [birth year] Referring Provider (PT): Dr Darrow Bussing   Encounter Date: 02/20/2021   PT End of Session - 02/20/21 1204     Visit Number 3    Date for PT Re-Evaluation 04/11/21    Authorization Type [employer]    PT Start Time 1147    PT Stop Time 1227    PT Time Calculation (min) 40 min    Activity Tolerance Patient tolerated treatment well    Behavior During Therapy [facility] for tasks assessed/performed             Past Medical History:  Diagnosis Date   Hypercholesteremia    Mild   Hyperplastic colon polyp    Hypertension     Past Surgical History:  Procedure Laterality Date   COLONOSCOPY  2015   REPLACEMENT TOTAL KNEE Left 1996    There were no vitals filed for this visit.   Subjective Assessment - 02/20/21 1156     Subjective Patient states he is "doing alright".  He rates his pain is 4/10.    Pertinent History 1997 knee surgery didn't heal right    Limitations House hold activities;Lifting;Walking    How long can you sit comfortably? as long as I want but sharp pain on rising    How long can you walk comfortably? everyone says I walk slowly;  50 feet    Diagnostic tests none    Patient Stated Goals get rid of some of the pain;  I like to play with weights  a little bit; I'd like to pick up my grandson 57 years old    Currently in Pain? Yes    Pain Score 4     Pain Location Hip    Pain Orientation Right    Pain Descriptors / Indicators Aching    Pain Type Chronic pain    Pain Onset More than a month ago                               [facility] Adult PT Treatment/Exercise - 02/20/21 0001       Lumbar Exercises: Aerobic   Nustep Level 3 x 5 min      Knee/Hip Exercises: Stretches   Active  Hamstring Stretch Right;3 reps;30 seconds    Active Hamstring Stretch Limitations right foot on 3rd step    Quad Stretch Right;3 reps;30 seconds    Quad Stretch Limitations holding onto step handrail with foot in chair behind him    Hip Flexor Stretch Right;3 reps;30 seconds    Hip Flexor Stretch Limitations left foot on 3rd step- lunging fwd    Piriformis Stretch Right;3 reps;30 seconds    Other Knee/Hip Stretches Hip IR's stretch (figure 4 with manual pressure to knee) 3 x 30 sec    Other Knee/Hip Stretches Hip ER's stretch 3 x 30 sec (supine right knee adducted in hooklying, left foot on top of right knee pressing into IR      Knee/Hip Exercises: Supine   Straight Leg Raises Right;2 sets;10 reps      Knee/Hip Exercises: Sidelying   Hip ABduction Strengthening;Right;2 sets;10 reps    Hip ADduction Strengthening;Right;2 sets;10 reps      Knee/Hip Exercises: Prone   Hip Extension  Strengthening;Right;2 sets;10 reps      Manual Therapy   Soft tissue mobilization Addaday assisted STM to Rt gluteals, proximal quads/hip flexors                       PT Short Term Goals - 02/14/21 1405       PT SHORT TERM GOAL #1   Title The patient will demonstrate basic self care strategies and initial HEP    Time 4    Period Weeks    Status New    Target Date 03/14/21      PT SHORT TERM GOAL #2   Title The patient will report a 40% improvement in right lateral hip pain    Time 4    Period Weeks    Status New      PT SHORT TERM GOAL #3   Title Improved lumbar flexion to 60 degrees and hip flexion to 110 degrees for greater ease with lifting    Time 4    Period Weeks    Status New      PT SHORT TERM GOAL #4   Title Improved  HS length to 70 degrees and hip flexor length to 10 degrees for improved mobility with work tasks/lifting    Time 4    Period Weeks    Status New      PT SHORT TERM GOAL #5   Title Hip external rotation to 45 degrees on right needed for greater ease  putting on socks/shoes    Time 4    Period Weeks    Status New               PT Long Term Goals - 02/14/21 1410       PT LONG TERM GOAL #1   Title The patient will be independent with HEP and will return to lifting his home weights    Time 8    Period Weeks    Status New    Target Date 04/11/21      PT LONG TERM GOAL #2   Title The patient will report a 70% improvement in right hip pain with home and work ADLs    Time 8    Period Weeks    Status New      PT LONG TERM GOAL #3   Title Improved bil hip and trunk strength to grossly 4+/5 to 5/5 needed for lifting heavier weights    Time 8    Period Weeks    Status New      PT LONG TERM GOAL #4   Title The patient will have improved lumbopelvic and hip mobility for greater ease ascending and descending his parents basement steps and possible return to bowling    Time 8    Period Weeks    Status New      PT LONG TERM GOAL #5   Title FOTO score improved from 49% to 71%    Time 8    Period Weeks    Status New                   Plan - 02/20/21 1205     Clinical Impression Statement Patient seemed to have backed off on his stretches and was having some return of pain.  He responded well to focus on flexibility exercises today.  He reported pain at  /10 upon    Personal Factors and Comorbidities Profession    Examination-Activity Limitations Lift;Stairs;Squat;Transfers  Examination-Participation Restrictions Occupation;Community Activity;Interpersonal Relationship    Stability/Clinical Decision Making Stable/Uncomplicated    Clinical Decision Making Low    Rehab Potential Good    PT Frequency 2x / week    PT Duration 8 weeks    PT Treatment/Interventions ADLs/Self Care Home Management;Aquatic Therapy;Cryotherapy;Electrical Stimulation;Ultrasound;Moist Heat;Iontophoresis 4mg /ml Dexamethasone;Traction;Therapeutic activities;Therapeutic exercise;Neuromuscular re-education;Manual techniques;Patient/family  education;Dry needling;Taping;Spinal Manipulations    PT Next Visit Plan Continue flexibility and progress strengthening as tolerated.  Addaday for STM    PT Home Exercise Plan Access Code: [email or web address]    Consulted and Agree with Plan of Care Patient             Patient will benefit from skilled therapeutic intervention in order to improve the following deficits and impairments:  Decreased range of motion, Increased fascial restricitons, Pain, Decreased strength, Impaired flexibility, Hypomobility  Visit Diagnosis: Pain in right hip  Stiffness of right hip, not elsewhere classified  Muscle weakness (generalized)  Stiffness of left hip, not elsewhere classified     Problem List Patient Active Problem List   Diagnosis Date Noted   Atypical chest pain 11/03/2016   Dyslipidemia 11/03/2016   Family history of peripheral arterial disease 11/03/2016   Smoker 11/03/2016   Family history of abdominal aortic aneurysm (AAA) 11/03/2016    11/05/2016 B. Meranda Dechaine, PT 02/21/2211:28 PM   [facility] [address] [place], [place], [place] Phone: [phone]   Fax:  [phone]  Name: DHRUV CHRISTINA MRN: Dareen Piano Date of Birth: [birth year]

## 2021-02-26 ENCOUNTER — Ambulatory Visit: Payer: Managed Care, Other (non HMO)

## 2021-02-26 ENCOUNTER — Other Ambulatory Visit: Payer: Self-pay

## 2021-02-26 DIAGNOSIS — M25652 Stiffness of left hip, not elsewhere classified: Secondary | ICD-10-CM

## 2021-02-26 DIAGNOSIS — M25551 Pain in right hip: Secondary | ICD-10-CM

## 2021-02-26 DIAGNOSIS — M6281 Muscle weakness (generalized): Secondary | ICD-10-CM

## 2021-02-26 DIAGNOSIS — M25651 Stiffness of right hip, not elsewhere classified: Secondary | ICD-10-CM

## 2021-02-26 NOTE — Therapy (Signed)
[facility] [address] [place], [place], [address] Phone: [phone]   Fax:  [phone]  Physical Therapy Treatment  Patient Details  Name: Tyler Dickerson MRN: [id number] Date of Birth: May 25, 1965 Referring Provider (PT): Dr Darrow Bussing   Encounter Date: 02/26/2021   PT End of Session - 02/26/21 1310     Visit Number 4    Date for PT Re-Evaluation 04/11/21    Authorization Type [employer]    PT Start Time 1230    PT Stop Time 1310    PT Time Calculation (min) 40 min    Activity Tolerance Patient tolerated treatment well    Behavior During Therapy [facility] for tasks assessed/performed             Past Medical History:  Diagnosis Date   Hypercholesteremia    Mild   Hyperplastic colon polyp    Hypertension     Past Surgical History:  Procedure Laterality Date   COLONOSCOPY  2015   REPLACEMENT TOTAL KNEE Left 1996    There were no vitals filed for this visit.   Subjective Assessment - 02/26/21 1239     Subjective Patient states left pain at 4/10 and low back pain 4/10.  He admits he is able to do functional tasks with increased ease.    Pertinent History 1997 knee surgery didn't heal right    Limitations House hold activities;Lifting;Walking    How long can you sit comfortably? as long as I want but sharp pain on rising    How long can you walk comfortably? everyone says I walk slowly;  50 feet    Diagnostic tests none    Patient Stated Goals get rid of some of the pain;  I like to play with weights  a little bit; I'd like to pick up my grandson 57 years old    Currently in Pain? Yes    Pain Score 4     Pain Location Back    Pain Orientation Right;Lower    Pain Descriptors / Indicators Aching;Nagging    Pain Onset More than a month ago                               [facility] Adult PT Treatment/Exercise - 02/26/21 0001       Lumbar Exercises: Stretches   Active Hamstring Stretch  Right;Left;2 reps;20 seconds    Active Hamstring Stretch Limitations standing at steps holding onto handrails 3 x 30 sec both    Lower Trunk Rotation Limitations 10 reps A/ROM Lt/Rt    Hip Flexor Stretch Left;Right;2 reps;20 seconds    Hip Flexor Stretch Limitations standing with foot in chair behind him 3 x 30 sec both    Piriformis Stretch Right;20 seconds;1 rep    Piriformis Stretch Limitations hooklying    Figure 4 Stretch With overpressure;20 seconds    Figure 4 Stretch Limitations Rt    Other Lumbar Stretch Exercise open book bilaterally x 10 reps      Lumbar Exercises: Supine   Pelvic Tilt 20 reps                       PT Short Term Goals - 02/14/21 1405       PT SHORT TERM GOAL #1   Title The patient will demonstrate basic self care strategies and initial HEP    Time 4    Period  Weeks    Status New    Target Date 03/14/21      PT SHORT TERM GOAL #2   Title The patient will report a 40% improvement in right lateral hip pain    Time 4    Period Weeks    Status New      PT SHORT TERM GOAL #3   Title Improved lumbar flexion to 60 degrees and hip flexion to 110 degrees for greater ease with lifting    Time 4    Period Weeks    Status New      PT SHORT TERM GOAL #4   Title Improved  HS length to 70 degrees and hip flexor length to 10 degrees for improved mobility with work tasks/lifting    Time 4    Period Weeks    Status New      PT SHORT TERM GOAL #5   Title Hip external rotation to 45 degrees on right needed for greater ease putting on socks/shoes    Time 4    Period Weeks    Status New               PT Long Term Goals - 02/14/21 1410       PT LONG TERM GOAL #1   Title The patient will be independent with HEP and will return to lifting his home weights    Time 8    Period Weeks    Status New    Target Date 04/11/21      PT LONG TERM GOAL #2   Title The patient will report a 70% improvement in right hip pain with home and work ADLs     Time 8    Period Weeks    Status New      PT LONG TERM GOAL #3   Title Improved bil hip and trunk strength to grossly 4+/5 to 5/5 needed for lifting heavier weights    Time 8    Period Weeks    Status New      PT LONG TERM GOAL #4   Title The patient will have improved lumbopelvic and hip mobility for greater ease ascending and descending his parents basement steps and possible return to bowling    Time 8    Period Weeks    Status New      PT LONG TERM GOAL #5   Title FOTO score improved from 49% to 71%    Time 8    Period Weeks    Status New                   Plan - 02/26/21 1310     Clinical Impression Statement Mr. Boulden was able to complete all tasks with ease.  He reported no pain at end of session.  He is progressing appropriately but would likely improve more consistently if he was more compliant with his HEP.  He needs focus on hip flexor and quad stretching as he has very limited lumbar lordosis.    Personal Factors and Comorbidities Profession    Examination-Activity Limitations Lift;Stairs;Squat;Transfers    Examination-Participation Restrictions Occupation;Community Activity;Interpersonal Relationship    Stability/Clinical Decision Making Stable/Uncomplicated    Clinical Decision Making Low    Rehab Potential Good    PT Frequency 2x / week    PT Duration 8 weeks    PT Treatment/Interventions ADLs/Self Care Home Management;Aquatic Therapy;Cryotherapy;Electrical Stimulation;Ultrasound;Moist Heat;Iontophoresis 4mg /ml Dexamethasone;Traction;Therapeutic activities;Therapeutic exercise;Neuromuscular re-education;Manual techniques;Patient/family education;Dry needling;Taping;Spinal Manipulations  PT Next Visit Plan Continue flexibility and progress strengthening as tolerated.  Addaday for STM    PT Home Exercise Plan Access Code: [email or web address]    Consulted and Agree with Plan of Care Patient             Patient will benefit from skilled therapeutic  intervention in order to improve the following deficits and impairments:  Decreased range of motion, Increased fascial restricitons, Pain, Decreased strength, Impaired flexibility, Hypomobility  Visit Diagnosis: Pain in right hip  Stiffness of right hip, not elsewhere classified  Muscle weakness (generalized)  Stiffness of left hip, not elsewhere classified     Problem List Patient Active Problem List   Diagnosis Date Noted   Atypical chest pain 11/03/2016   Dyslipidemia 11/03/2016   Family history of peripheral arterial disease 11/03/2016   Smoker 11/03/2016   Family history of abdominal aortic aneurysm (AAA) 11/03/2016    Victorino Dike B. Jerson Furukawa, PT 12/21/221:16 PM   [facility] [address] [place], [place], [address] Phone: [phone]   Fax:  [phone]  Name: BRAYDEN BRODHEAD MRN: [id number] Date of Birth: 1966-01-10

## 2021-02-26 NOTE — Patient Instructions (Signed)
Continue current HEP 

## 2021-03-04 ENCOUNTER — Other Ambulatory Visit: Payer: Self-pay

## 2021-03-04 ENCOUNTER — Encounter: Payer: Self-pay | Admitting: Physical Therapy

## 2021-03-04 ENCOUNTER — Ambulatory Visit: Payer: Managed Care, Other (non HMO) | Admitting: Physical Therapy

## 2021-03-04 DIAGNOSIS — M25652 Stiffness of left hip, not elsewhere classified: Secondary | ICD-10-CM

## 2021-03-04 DIAGNOSIS — M25551 Pain in right hip: Secondary | ICD-10-CM | POA: Diagnosis not present

## 2021-03-04 DIAGNOSIS — M25651 Stiffness of right hip, not elsewhere classified: Secondary | ICD-10-CM

## 2021-03-04 DIAGNOSIS — M6281 Muscle weakness (generalized): Secondary | ICD-10-CM

## 2021-03-04 NOTE — Therapy (Signed)
[facility] @ [facility] [address] [place], [place], [address] Phone: [phone]   Fax:  [phone]  Physical Therapy Treatment  Patient Details  Name: Tyler Dickerson MRN: [id number] Date of Birth: [birth year] Referring Provider (PT): Dr Darrow Bussing   Encounter Date: 03/04/2021   PT End of Session - 03/04/21 1236     Visit Number 5    Date for PT Re-Evaluation 04/11/21    Authorization Type [employer]    PT Start Time 1145    PT Stop Time 1230    PT Time Calculation (min) 45 min    Activity Tolerance Patient tolerated treatment well    Behavior During Therapy [facility] for tasks assessed/performed             Past Medical History:  Diagnosis Date   Hypercholesteremia    Mild   Hyperplastic colon polyp    Hypertension     Past Surgical History:  Procedure Laterality Date   COLONOSCOPY  2015   REPLACEMENT TOTAL KNEE Left 1996    There were no vitals filed for this visit.   Subjective Assessment - 03/04/21 1148     Subjective Pt reports 35-40% improvement in hip and back since starting PT.    Pertinent History 1997 knee surgery didn't heal right    Limitations House hold activities;Lifting;Walking    How long can you sit comfortably? as long as I want but sharp pain on rising    How long can you walk comfortably? everyone says I walk slowly;  50 feet    Diagnostic tests none    Patient Stated Goals get rid of some of the pain;  I like to play with weights  a little bit; I'd like to pick up my grandson 33 years old    Currently in Pain? No/denies    Pain Orientation Right    Pain Descriptors / Indicators Aching;Nagging    Pain Type Chronic pain    Pain Onset More than a month ago    Pain Frequency Intermittent    Aggravating Factors  lift, bend, rise after sitting, up/down stairs    Pain Relieving Factors being still                               [facility] Adult PT Treatment/Exercise - 03/04/21  0001       Exercises   Exercises Lumbar;Knee/Hip      Lumbar Exercises: Stretches   Active Hamstring Stretch Left;Right;2 reps;20 seconds    Active Hamstring Stretch Limitations seated edge of mat table    Lower Trunk Rotation Limitations 10 reps A/ROM Lt/Rt    Hip Flexor Stretch Left;Right;2 reps;20 seconds    Hip Flexor Stretch Limitations standing with foot in chair behind him 3 x 30 sec both    Quad Stretch Left;Right;1 rep;60 seconds    Quad Stretch Limitations in SL passive by PT with contract/relax    Piriformis Stretch Right;20 seconds;1 rep;Left    Piriformis Stretch Limitations hooklying    Figure 4 Stretch With overpressure;20 seconds    Figure 4 Stretch Limitations Rt    Other Lumbar Stretch Exercise open book bilaterally x 10 reps      Lumbar Exercises: Aerobic   Nustep L3 x 5' PT present to monitor and discuss symptoms      Knee/Hip Exercises: Stretches   Other Knee/Hip Stretches supine butterfly 3x15" with overpressure    Other Knee/Hip  Stretches straddle lunge side to side hands on counter for hip adductor stretch x 6 rounds      Knee/Hip Exercises: Standing   Hip Extension Stengthening;Both;1 set;10 reps;Knee straight    Extension Limitations UE support on back of chair    Functional Squat Limitations squat to chair touch x 10      Knee/Hip Exercises: Supine   Straight Leg Raises 10 reps;Both;2 sets      Knee/Hip Exercises: Sidelying   Hip ABduction Strengthening;Both;1 set;10 reps      Manual Therapy   Manual Therapy Soft tissue mobilization    Soft tissue mobilization Addaday assisted STM to Rt gluteals, proximal quads/hip flexors                       PT Short Term Goals - 03/04/21 1238       PT SHORT TERM GOAL #1   Title The patient will demonstrate basic self care strategies and initial HEP    Status Achieved      PT SHORT TERM GOAL #2   Title The patient will report a 40% improvement in right lateral hip pain    Baseline 35-40%     Status Achieved      PT SHORT TERM GOAL #3   Title Improved lumbar flexion to 60 degrees and hip flexion to 110 degrees for greater ease with lifting    Status On-going      PT SHORT TERM GOAL #4   Title Improved  HS length to 70 degrees and hip flexor length to 10 degrees for improved mobility with work tasks/lifting    Status On-going      PT SHORT TERM GOAL #5   Title Hip external rotation to 45 degrees on right needed for greater ease putting on socks/shoes    Status Achieved               PT Long Term Goals - 02/14/21 1410       PT LONG TERM GOAL #1   Title The patient will be independent with HEP and will return to lifting his home weights    Time 8    Period Weeks    Status New    Target Date 04/11/21      PT LONG TERM GOAL #2   Title The patient will report a 70% improvement in right hip pain with home and work ADLs    Time 8    Period Weeks    Status New      PT LONG TERM GOAL #3   Title Improved bil hip and trunk strength to grossly 4+/5 to 5/5 needed for lifting heavier weights    Time 8    Period Weeks    Status New      PT LONG TERM GOAL #4   Title The patient will have improved lumbopelvic and hip mobility for greater ease ascending and descending his parents basement steps and possible return to bowling    Time 8    Period Weeks    Status New      PT LONG TERM GOAL #5   Title FOTO score improved from 49% to 71%    Time 8    Period Weeks    Status New                   Plan - 03/04/21 1235     Clinical Impression Statement Pt reports 35-40% improvement in LBP and hip stiffness/catching  since starting PT.  He continues to have limited bil hip ROM secondary to joint and flexibility restrictions.  PT added supine butterfly and standing lunge stretch to include hip adductor stretching today.  Pt able to demo functional squat to chair x 10 reps without hip pain or catch today.  He reports difficulty with stairs continues to improve.   Continue along POC.    PT Frequency 2x / week    PT Duration 8 weeks    PT Treatment/Interventions ADLs/Self Care Home Management;Aquatic Therapy;Cryotherapy;Electrical Stimulation;Ultrasound;Moist Heat;Iontophoresis 4mg /ml Dexamethasone;Traction;Therapeutic activities;Therapeutic exercise;Neuromuscular re-education;Manual techniques;Patient/family education;Dry needling;Taping;Spinal Manipulations    PT Next Visit Plan Continue flexibility and progress strengthening as tolerated.  Addaday for STM    PT Home Exercise Plan Access Code: [email or web address]    Consulted and Agree with Plan of Care Patient             Patient will benefit from skilled therapeutic intervention in order to improve the following deficits and impairments:     Visit Diagnosis: Pain in right hip  Stiffness of right hip, not elsewhere classified  Muscle weakness (generalized)  Stiffness of left hip, not elsewhere classified     Problem List Patient Active Problem List   Diagnosis Date Noted   Atypical chest pain 11/03/2016   Dyslipidemia 11/03/2016   Family history of peripheral arterial disease 11/03/2016   Smoker 11/03/2016   Family history of abdominal aortic aneurysm (AAA) 11/03/2016   11/05/2016, PT 03/04/21 12:41 PM   [facility] @ [facility] [address] [place], [place], [place] Phone: [phone]   Fax:  [phone]  Name: Tyler Dickerson MRN: Dareen Piano Date of Birth: [birth year]

## 2021-03-11 ENCOUNTER — Encounter: Payer: Self-pay | Admitting: Physical Therapy

## 2021-03-11 ENCOUNTER — Ambulatory Visit: Payer: Managed Care, Other (non HMO) | Attending: Family Medicine | Admitting: Physical Therapy

## 2021-03-11 ENCOUNTER — Other Ambulatory Visit: Payer: Self-pay

## 2021-03-11 DIAGNOSIS — M25652 Stiffness of left hip, not elsewhere classified: Secondary | ICD-10-CM | POA: Diagnosis present

## 2021-03-11 DIAGNOSIS — M25651 Stiffness of right hip, not elsewhere classified: Secondary | ICD-10-CM | POA: Insufficient documentation

## 2021-03-11 DIAGNOSIS — M25551 Pain in right hip: Secondary | ICD-10-CM | POA: Insufficient documentation

## 2021-03-11 DIAGNOSIS — M6281 Muscle weakness (generalized): Secondary | ICD-10-CM | POA: Insufficient documentation

## 2021-03-11 NOTE — Therapy (Signed)
[facility] @ [facility] [place], [place], [address] Phone: [phone]   Fax:  [phone]  Physical Therapy Treatment  Patient Details  Name: Tyler Dickerson MRN: [id number] Date of Birth: 1965/04/19 Referring Provider (PT): Dr Lujean Amel   Encounter Date: 03/11/2021   PT End of Session - 03/11/21 1139     Visit Number 6    Date for PT Re-Evaluation 04/11/21    Authorization Type [employer]    PT Start Time 1142    PT Stop Time 1230    PT Time Calculation (min) 48 min    Activity Tolerance Patient tolerated treatment well    Behavior During Therapy [facility] for tasks assessed/performed             Past Medical History:  Diagnosis Date   Hypercholesteremia    Mild   Hyperplastic colon polyp    Hypertension     Past Surgical History:  Procedure Laterality Date   COLONOSCOPY  2015   REPLACEMENT TOTAL KNEE Left 1996    There were no vitals filed for this visit.   Subjective Assessment - 03/11/21 1139     Subjective I had to do a lot of walking before coming here to run some errands so my Rt hip is sore.  4/10.    Pertinent History 1997 knee surgery didn't heal right    Limitations House hold activities;Lifting;Walking    How long can you sit comfortably? as long as I want but sharp pain on rising    How long can you walk comfortably? everyone says I walk slowly;  50 feet    Diagnostic tests none    Patient Stated Goals get rid of some of the pain;  I like to play with weights  a little bit; I'd like to pick up my grandson 44 years old    Currently in Pain? Yes    Pain Score 4     Pain Location Back    Pain Orientation Right    Pain Descriptors / Indicators Sore    Pain Type Chronic pain    Pain Onset More than a month ago    Pain Frequency Intermittent    Aggravating Factors  walking too much, lift, bend, rise from sitting, up/down stairs    Pain Relieving Factors sitting, being still                                [facility] Adult PT Treatment/Exercise - 03/11/21 0001       Exercises   Exercises Knee/Hip;Lumbar      Lumbar Exercises: Stretches   Single Knee to Chest Stretch Left;Right;3 reps;10 seconds    Single Knee to Chest Stretch Limitations with lumbar heat    Lower Trunk Rotation 3 reps;10 seconds    Lower Trunk Rotation Limitations with lumbar heat    Other Lumbar Stretch Exercise standing and seated Rt QL in doorframe x 15" each with Rt UE up and over reach    Other Lumbar Stretch Exercise seated lumbar flexion 3-way ball rollouts 5x5 sec      Lumbar Exercises: Aerobic   Nustep L4 x 5' PT present to monitor and discuss symptoms      Lumbar Exercises: Quadruped   Other Quadruped Lumbar Exercises child's pose 3-way x 2 rounds      Knee/Hip Exercises: Stretches   Active Hamstring Stretch Both;2 reps;20 seconds  Active Hamstring Stretch Limitations foot on 1st step, fold forward    Hip Flexor Stretch Both    Hip Flexor Stretch Limitations foot on 2nd step rock in/out x 10 reps    Gastroc Stretch Left;Right;2 reps;20 seconds    Gastroc Stretch Limitations slant board      Knee/Hip Exercises: Machines for Strengthening   Hip Cybex 30lb bil hip ext and abd 1x10 each      Modalities   Modalities Moist Heat      Moist Heat Therapy   Number Minutes Moist Heat 8 Minutes    Moist Heat Location Lumbar Spine   concurrent with supine stretches     Manual Therapy   Manual Therapy Soft tissue mobilization    Soft tissue mobilization Rt lumbopelvic hip in Lt SL                       PT Short Term Goals - 03/11/21 1333       PT SHORT TERM GOAL #1   Title The patient will demonstrate basic self care strategies and initial HEP    Status Achieved      PT SHORT TERM GOAL #2   Title The patient will report a 40% improvement in right lateral hip pain    Baseline 35-40%    Status Achieved      PT SHORT TERM GOAL #3   Title Improved lumbar  flexion to 60 degrees and hip flexion to 110 degrees for greater ease with lifting    Baseline hip flexion 115 bil, lumbar flexion 60 deg    Status Achieved      PT SHORT TERM GOAL #4   Title Improved  HS length to 70 degrees and hip flexor length to 10 degrees for improved mobility with work tasks/lifting    Status Achieved      PT SHORT TERM GOAL #5   Title Hip external rotation to 45 degrees on right needed for greater ease putting on socks/shoes    Status Achieved               PT Long Term Goals - 02/14/21 1410       PT LONG TERM GOAL #1   Title The patient will be independent with HEP and will return to lifting his home weights    Time 8    Period Weeks    Status New    Target Date 04/11/21      PT LONG TERM GOAL #2   Title The patient will report a 70% improvement in right hip pain with home and work ADLs    Time 8    Period Weeks    Status New      PT LONG TERM GOAL #3   Title Improved bil hip and trunk strength to grossly 4+/5 to 5/5 needed for lifting heavier weights    Time 8    Period Weeks    Status New      PT LONG TERM GOAL #4   Title The patient will have improved lumbopelvic and hip mobility for greater ease ascending and descending his parents basement steps and possible return to bowling    Time 8    Period Weeks    Status New      PT LONG TERM GOAL #5   Title FOTO score improved from 49% to 71%    Time 8    Period Weeks    Status New  Plan - 03/11/21 1214     Clinical Impression Statement Pt arrived with report of Rt hip and lumbar soreness following running errands this morning, rated 4/10.  He has met all STGs for improved ROM and flexibility.  He continues to have global stiffness in Rt>Lt hip and fascial tightness in lumbar spine.  PT added hip cybex strength for ext and abd bil today with good tolerance and added moist lumbar heat concurrent with supine stretches which reduced soreness.  STM to Rt lower lumbar  and hip quadrant used end of session for stiffness and soreness.  Continue along POC.    PT Next Visit Plan Continue flexibility and progress strengthening as tolerated.  Addaday or STM to lumbopelvic hip area on Rt    PT Home Exercise Plan Access Code: [email or web address]    Consulted and Agree with Plan of Care Patient             Patient will benefit from skilled therapeutic intervention in order to improve the following deficits and impairments:     Visit Diagnosis: Pain in right hip  Stiffness of right hip, not elsewhere classified  Muscle weakness (generalized)  Stiffness of left hip, not elsewhere classified     Problem List Patient Active Problem List   Diagnosis Date Noted   Atypical chest pain 11/03/2016   Dyslipidemia 11/03/2016   Family history of peripheral arterial disease 11/03/2016   Smoker 11/03/2016   Family history of abdominal aortic aneurysm (AAA) 11/03/2016    Baruch Merl, PT 03/11/21 1:35 PM   [facility] @ [facility] [place], [place], [address] Phone: [phone]   Fax:  [phone]  Name: Tyler Dickerson MRN: [id number] Date of Birth: March 22, 1965

## 2021-03-13 ENCOUNTER — Other Ambulatory Visit: Payer: Self-pay

## 2021-03-13 ENCOUNTER — Encounter: Payer: Self-pay | Admitting: Physical Therapy

## 2021-03-13 ENCOUNTER — Ambulatory Visit: Payer: Managed Care, Other (non HMO) | Admitting: Physical Therapy

## 2021-03-13 DIAGNOSIS — M25551 Pain in right hip: Secondary | ICD-10-CM | POA: Diagnosis not present

## 2021-03-13 DIAGNOSIS — M6281 Muscle weakness (generalized): Secondary | ICD-10-CM

## 2021-03-13 DIAGNOSIS — M25651 Stiffness of right hip, not elsewhere classified: Secondary | ICD-10-CM

## 2021-03-13 DIAGNOSIS — M25652 Stiffness of left hip, not elsewhere classified: Secondary | ICD-10-CM

## 2021-03-13 NOTE — Therapy (Signed)
[facility] [address] [place], [place], [address] Phone: [phone]   Fax:  [phone]  Physical Therapy Treatment  Patient Details  Name: Tyler Dickerson MRN: [id number] Date of Birth: 06-02-1965 Referring Provider (PT): Dr Darrow Bussing   Encounter Date: 03/13/2021   PT End of Session - 03/13/21 1150     Visit Number 7    Date for PT Re-Evaluation 04/11/21    Authorization Type [employer]    PT Start Time 1145    PT Stop Time 1226    PT Time Calculation (min) 41 min    Activity Tolerance Patient tolerated treatment well    Behavior During Therapy [facility] for tasks assessed/performed             Past Medical History:  Diagnosis Date   Hypercholesteremia    Mild   Hyperplastic colon polyp    Hypertension     Past Surgical History:  Procedure Laterality Date   COLONOSCOPY  2015   REPLACEMENT TOTAL KNEE Left 1996    There were no vitals filed for this visit.   Subjective Assessment - 03/13/21 1143     Subjective I had a better day yesterday but today I am very sore.    Pertinent History 1997 knee surgery didn't heal right    Limitations House hold activities;Lifting;Walking    How long can you sit comfortably? as long as I want but sharp pain on rising    How long can you walk comfortably? everyone says I walk slowly;  50 feet    Diagnostic tests none    Patient Stated Goals get rid of some of the pain;  I like to play with weights  a little bit; I'd like to pick up my grandson 58 years old    Currently in Pain? Yes    Pain Score 4     Pain Location Back    Pain Orientation Right;Left;Lower;Mid    Pain Descriptors / Indicators Sore    Pain Type Chronic pain    Pain Onset More than a month ago    Pain Frequency Intermittent                               [facility] Adult PT Treatment/Exercise - 03/13/21 0001       Exercises   Exercises Lumbar;Knee/Hip;Shoulder      Lumbar  Exercises: Aerobic   Nustep L4 x 5' PT present to monitor and discuss symptoms   with lumbar heat     Lumbar Exercises: Machines for Strengthening   Leg Press 80lb bil 2x15 reps      Lumbar Exercises: Supine   Bridge 10 reps    Bridge Limitations limited excursion of bridge      Lumbar Exercises: Quadruped   Other Quadruped Lumbar Exercises child's pose 3-way x 2 rounds 5 sec holds each      Knee/Hip Exercises: Stretches   Active Hamstring Stretch Both;1 rep;30 seconds    Active Hamstring Stretch Limitations foot on 1st step, fold forward    Hip Flexor Stretch Both    Hip Flexor Stretch Limitations foot on 2nd step rock in/out x 10 reps    Gastroc Stretch Left;Right;1 rep;30 seconds    Gastroc Stretch Limitations slant board    Other Knee/Hip Stretches supine hip IR/ER 3x5" holds      Knee/Hip Exercises: Machines for Strengthening   Hip Cybex 30lb bil  hip ext and abd 1x10 each      Knee/Hip Exercises: Standing   Forward Step Up Both;1 set;10 reps;Step Height: 6";Hand Hold: 2    Forward Step Up Limitations march tap 2nd step      Shoulder Exercises: Standing   Row Strengthening;10 reps;Theraband    Theraband Level (Shoulder Row) Level 4 (Blue)    Row Limitations bil, Rt with Rt Rot, Lt with Lt Rot cycles x 10      Shoulder Exercises: ROM/Strengthening   Wall Pushups 10 reps    Wall Pushups Limitations hands on red physioball on wall      Shoulder Exercises: Power Soil scientistTower   Other Power Tower Exercises standing lat pulldown x 15 40lb                       PT Short Term Goals - 03/11/21 1333       PT SHORT TERM GOAL #1   Title The patient will demonstrate basic self care strategies and initial HEP    Status Achieved      PT SHORT TERM GOAL #2   Title The patient will report a 40% improvement in right lateral hip pain    Baseline 35-40%    Status Achieved      PT SHORT TERM GOAL #3   Title Improved lumbar flexion to 60 degrees and hip flexion to 110  degrees for greater ease with lifting    Baseline hip flexion 115 bil, lumbar flexion 60 deg    Status Achieved      PT SHORT TERM GOAL #4   Title Improved  HS length to 70 degrees and hip flexor length to 10 degrees for improved mobility with work tasks/lifting    Status Achieved      PT SHORT TERM GOAL #5   Title Hip external rotation to 45 degrees on right needed for greater ease putting on socks/shoes    Status Achieved               PT Long Term Goals - 02/14/21 1410       PT LONG TERM GOAL #1   Title The patient will be independent with HEP and will return to lifting his home weights    Time 8    Period Weeks    Status New    Target Date 04/11/21      PT LONG TERM GOAL #2   Title The patient will report a 70% improvement in right hip pain with home and work ADLs    Time 8    Period Weeks    Status New      PT LONG TERM GOAL #3   Title Improved bil hip and trunk strength to grossly 4+/5 to 5/5 needed for lifting heavier weights    Time 8    Period Weeks    Status New      PT LONG TERM GOAL #4   Title The patient will have improved lumbopelvic and hip mobility for greater ease ascending and descending his parents basement steps and possible return to bowling    Time 8    Period Weeks    Status New      PT LONG TERM GOAL #5   Title FOTO score improved from 49% to 71%    Time 8    Period Weeks    Status New  Plan - 03/13/21 1151     Clinical Impression Statement Pt reports he had one good day following last session but is sore again today.  Soreness is rated 4/10 and reduces with use of heat and stretches.  PT incorporated more strengthening ther ex for hips, shoulders and trunk to optimize posture and mobility which did reduce soreness within session per Pt report.  Discussed idea of joining gym or evenutal transition to HEP from home.  Continue along POC.    Rehab Potential Good    PT Frequency 2x / week    PT Duration 8 weeks     PT Treatment/Interventions ADLs/Self Care Home Management;Aquatic Therapy;Cryotherapy;Electrical Stimulation;Ultrasound;Moist Heat;Iontophoresis 4mg /ml Dexamethasone;Traction;Therapeutic activities;Therapeutic exercise;Neuromuscular re-education;Manual techniques;Patient/family education;Dry needling;Taping;Spinal Manipulations    PT Next Visit Plan Continue flexibility and progress strengthening as tolerated.  Addaday or STM to lumbopelvic hip area on Rt    PT Home Exercise Plan Access Code: [email or web address]    Consulted and Agree with Plan of Care Patient             Patient will benefit from skilled therapeutic intervention in order to improve the following deficits and impairments:     Visit Diagnosis: Pain in right hip  Stiffness of right hip, not elsewhere classified  Muscle weakness (generalized)  Stiffness of left hip, not elsewhere classified     Problem List Patient Active Problem List   Diagnosis Date Noted   Atypical chest pain 11/03/2016   Dyslipidemia 11/03/2016   Family history of peripheral arterial disease 11/03/2016   Smoker 11/03/2016   Family history of abdominal aortic aneurysm (AAA) 11/03/2016    11/05/2016, PT 03/13/21 12:27 PM   [facility] @ [facility] [address] [place], [place], [place] Phone: [phone]   Fax:  [phone]  Name: MISAEL MCGAHA MRN: Dareen Piano Date of Birth: 03-18-1965

## 2021-03-19 ENCOUNTER — Other Ambulatory Visit: Payer: Self-pay

## 2021-03-19 ENCOUNTER — Ambulatory Visit: Payer: Managed Care, Other (non HMO) | Admitting: Physical Therapy

## 2021-03-19 ENCOUNTER — Encounter: Payer: Self-pay | Admitting: Physical Therapy

## 2021-03-19 DIAGNOSIS — M25651 Stiffness of right hip, not elsewhere classified: Secondary | ICD-10-CM

## 2021-03-19 DIAGNOSIS — M25551 Pain in right hip: Secondary | ICD-10-CM | POA: Diagnosis not present

## 2021-03-19 DIAGNOSIS — M25652 Stiffness of left hip, not elsewhere classified: Secondary | ICD-10-CM

## 2021-03-19 DIAGNOSIS — M6281 Muscle weakness (generalized): Secondary | ICD-10-CM

## 2021-03-19 NOTE — Therapy (Signed)
[facility] [address] [place], [place], [address] Phone: [phone]   Fax:  [phone]  Physical Therapy Treatment  Patient Details  Name: Tyler PianoRachon A Heinzel MRN: [id number] Date of Birth: Apr 18, 1965 Referring Provider (PT): Dr Darrow Bussingibas Koirala   Encounter Date: 03/19/2021    Past Medical History:  Diagnosis Date   Hypercholesteremia    Mild   Hyperplastic colon polyp    Hypertension     Past Surgical History:  Procedure Laterality Date   COLONOSCOPY  2015   REPLACEMENT TOTAL KNEE Left 1996    There were no vitals filed for this visit.   Subjective Assessment - 03/19/21 1145     Subjective I continue to have pain across Rt hip bone with walking 5-10 min.  It comes and goes.  Hard to pinpoint.  No more sharp pains or trouble with going from sitting to standing or with stairs.  I am doing better with bending and lifting at work - less frequent pain.    Pertinent History 1997 knee surgery didn't heal right    Limitations House hold activities;Lifting;Walking    How long can you sit comfortably? as long as I want but sharp pain on rising    How long can you walk comfortably? everyone says I walk slowly;  50 feet    Diagnostic tests none    Patient Stated Goals get rid of some of the pain;  I like to play with weights  a little bit; I'd like to pick up my grandson 58 years old    Currently in Pain? Yes    Pain Score 4     Pain Location Back    Pain Orientation Right;Lower    Pain Descriptors / Indicators Sore    Pain Type Chronic pain    Pain Onset More than a month ago    Pain Frequency Intermittent    Aggravating Factors  walk                               [facility]/Exercise - 03/19/21 0001       Exercises   Exercises Lumbar;Knee/Hip;Shoulder      Lumbar Exercises: Stretches   Lower Trunk Rotation 3 reps;10 seconds    Figure 4 Stretch 2 reps;20 seconds    Figure 4  Stretch Limitations supine    Other Lumbar Stretch Exercise bil hip IR/ER A/ROM x 10, IR with OP x 15 sec each      Lumbar Exercises: Aerobic   Nustep L4 x 5' PT present to monitor and discuss symptoms      Lumbar Exercises: Machines for Strengthening   Leg Press 90lb bil 2x15 reps      Lumbar Exercises: Standing   Other Standing Lumbar Exercises RDL x 10, 15lb kbell      Knee/Hip Exercises: Stretches   Active Hamstring Stretch Both;2 reps;20 seconds    Active Hamstring Stretch Limitations foot on 1st step, fold forward    Hip Flexor Stretch Both    Hip Flexor Stretch Limitations foot on 2nd step 2x15" each Rt/Lt    Gastroc Stretch Left;Right;1 rep;30 seconds    Gastroc Stretch Limitations slant board    Other Knee/Hip Stretches supine hip IR/ER 3x5" holds      Knee/Hip Exercises: Machines for Strengthening   Hip Cybex 40lb bil hip ext and abd 1x10 each      Knee/Hip Exercises:  Standing   Forward Step Up Both;1 set;10 reps;Step Height: 6";Hand Hold: 2    Forward Step Up Limitations march tap 2nd step      Shoulder Exercises: Power Pensions consultant standing lat pulldown x 15 40lb      Manual Therapy   Manual Therapy Soft tissue mobilization    Soft tissue mobilization Addaday assisted STM Rt lumbar, Rt lateral and posterior hip                       PT Short Term Goals - 03/11/21 1333       PT SHORT TERM GOAL #1   Title The patient will demonstrate basic self care strategies and initial HEP    Status Achieved      PT SHORT TERM GOAL #2   Title The patient will report a 40% improvement in right lateral hip pain    Baseline 35-40%    Status Achieved      PT SHORT TERM GOAL #3   Title Improved lumbar flexion to 60 degrees and hip flexion to 110 degrees for greater ease with lifting    Baseline hip flexion 115 bil, lumbar flexion 60 deg    Status Achieved      PT SHORT TERM GOAL #4   Title Improved  HS length to 70 degrees and hip  flexor length to 10 degrees for improved mobility with work tasks/lifting    Status Achieved      PT SHORT TERM GOAL #5   Title Hip external rotation to 45 degrees on right needed for greater ease putting on socks/shoes    Status Achieved               PT Long Term Goals - 02/14/21 1410       PT LONG TERM GOAL #1   Title The patient will be independent with HEP and will return to lifting his home weights    Time 8    Period Weeks    Status New    Target Date 04/11/21      PT LONG TERM GOAL #2   Title The patient will report a 70% improvement in right hip pain with home and work ADLs    Time 8    Period Weeks    Status New      PT LONG TERM GOAL #3   Title Improved bil hip and trunk strength to grossly 4+/5 to 5/5 needed for lifting heavier weights    Time 8    Period Weeks    Status New      PT LONG TERM GOAL #4   Title The patient will have improved lumbopelvic and hip mobility for greater ease ascending and descending his parents basement steps and possible return to bowling    Time 8    Period Weeks    Status New      PT LONG TERM GOAL #5   Title FOTO score improved from 49% to 71%    Time 8    Period Weeks    Status New                   Plan - 03/19/21 1149     Clinical Impression Statement Pt reports no more sharp pains or trouble with going from sitting to standing or with stairs.  He has less frequency with bending and lifting at work.  He does report ongoing Rt LBP within 5-10  min of walking.  He has signif flexiblity and ROM restrictions in Rt hip and thigh which are likely contributing.  He presents with flexed trunk posture and needs frequent cueing to stand up tall and engage gluts within standing ther ex.  PT progressed resistance on LE machines today and introduced 15lb deadlifts which were well tolerated and good form was observed.  Addaday assisted STM used end of session along Rt lumbar and hip.  Continue along POC.    PT Frequency 2x /  week    PT Duration 8 weeks    PT Treatment/Interventions ADLs/Self Care Home Management;Aquatic Therapy;Cryotherapy;Electrical Stimulation;Ultrasound;Moist Heat;Iontophoresis 4mg /ml Dexamethasone;Traction;Therapeutic activities;Therapeutic exercise;Neuromuscular re-education;Manual techniques;Patient/family education;Dry needling;Taping;Spinal Manipulations    PT Next Visit Plan Continue flexibility and progress strengthening as tolerated.  Addaday or STM to lumbopelvic hip area on Rt    PT Home Exercise Plan Access Code: [email or web address]    Consulted and Agree with Plan of Care Patient             Patient will benefit from skilled therapeutic intervention in order to improve the following deficits and impairments:     Visit Diagnosis: Pain in right hip  Stiffness of right hip, not elsewhere classified  Muscle weakness (generalized)  Stiffness of left hip, not elsewhere classified     Problem List Patient Active Problem List   Diagnosis Date Noted   Atypical chest pain 11/03/2016   Dyslipidemia 11/03/2016   Family history of peripheral arterial disease 11/03/2016   Smoker 11/03/2016   Family history of abdominal aortic aneurysm (AAA) 11/03/2016    11/05/2016, PT 03/19/21 1:10 PM   [facility] [address] [place], [place], [place] Phone: [phone]   Fax:  [phone]  Name: Tyler Dickerson MRN: Tyler Dickerson Date of Birth: 11/05/1965

## 2021-03-26 ENCOUNTER — Encounter: Payer: Self-pay | Admitting: Physical Therapy

## 2021-03-26 ENCOUNTER — Other Ambulatory Visit: Payer: Self-pay

## 2021-03-26 ENCOUNTER — Ambulatory Visit: Payer: Managed Care, Other (non HMO) | Admitting: Physical Therapy

## 2021-03-26 DIAGNOSIS — M6281 Muscle weakness (generalized): Secondary | ICD-10-CM

## 2021-03-26 DIAGNOSIS — M25652 Stiffness of left hip, not elsewhere classified: Secondary | ICD-10-CM

## 2021-03-26 DIAGNOSIS — M25551 Pain in right hip: Secondary | ICD-10-CM

## 2021-03-26 DIAGNOSIS — M25651 Stiffness of right hip, not elsewhere classified: Secondary | ICD-10-CM

## 2021-03-26 NOTE — Therapy (Signed)
[facility] [address] [place], [place], [address] Phone: [phone]   Fax:  [phone]  Physical Therapy Treatment  Patient Details  Name: Tyler Dickerson MRN: [id number] Date of Birth: 24-Apr-1965 Referring Provider (PT): Dr Darrow Bussing   Encounter Date: 03/26/2021   PT End of Session - 03/26/21 1240     Visit Number 9    Date for PT Re-Evaluation 04/11/21    Authorization Type [employer]    PT Start Time 1237   Pt late   PT Stop Time 1315    PT Time Calculation (min) 38 min    Activity Tolerance Patient tolerated treatment well    Behavior During Therapy [facility] for tasks assessed/performed             Past Medical History:  Diagnosis Date   Hypercholesteremia    Mild   Hyperplastic colon polyp    Hypertension     Past Surgical History:  Procedure Laterality Date   COLONOSCOPY  2015   REPLACEMENT TOTAL KNEE Left 1996    There were no vitals filed for this visit.   Subjective Assessment - 03/26/21 1239     Subjective The Rt hip bone just keeps on hurting but other symptoms are better.    Pertinent History 1997 knee surgery didn't heal right    Limitations House hold activities;Lifting;Walking    How long can you sit comfortably? no pain with this anymore    How long can you walk comfortably? 10-15 min    Diagnostic tests none    Patient Stated Goals get rid of some of the pain;  I like to play with weights  a little bit; I'd like to pick up my grandson 58 years old    Currently in Pain? Yes    Pain Score 3     Pain Location Back    Pain Orientation Right;Lower    Pain Descriptors / Indicators Sore    Pain Type Chronic pain    Aggravating Factors  walk > 10-15'                               [facility] Adult PT Treatment/Exercise - 03/26/21 0001       Exercises   Exercises Lumbar;Knee/Hip;Shoulder      Lumbar Exercises: Stretches   Single Knee to Chest Stretch 3 reps;10  seconds;Left;Right    Lower Trunk Rotation 3 reps;10 seconds    Figure 4 Stretch 2 reps;20 seconds    Figure 4 Stretch Limitations supine    Other Lumbar Stretch Exercise bil hip IR/ER A/ROM x 10, IR with OP x 15 sec each    Other Lumbar Stretch Exercise seated thoracic rot with OP 2x10 Rt/Lt      Lumbar Exercises: Aerobic   Nustep L6 x 5' PT present to discuss progress      Lumbar Exercises: Machines for Strengthening   Leg Press 90lb bil 2x15 reps      Knee/Hip Exercises: Stretches   Active Hamstring Stretch Both;2 reps;20 seconds    Active Hamstring Stretch Limitations seated    Hip Flexor Stretch Both    Hip Flexor Stretch Limitations foot on 2nd step 2x15" each Rt/Lt      Knee/Hip Exercises: Machines for Strengthening   Cybex Leg Press 90lb 3x15    Hip Cybex 40lb bil hip ext and abd 1x10 each      Knee/Hip Exercises: Standing  Walking with Sports Cord 15lb pulleys x 5 rounds bwd walking    Other Standing Knee Exercises RDLs 15lb kbell x 15 reps      Shoulder Exercises: Power Pensions consultantTower   Other Power Tower Exercises standing lat pulldown x 10 40lb      Moist Heat Therapy   Number Minutes Moist Heat 10 Minutes    Moist Heat Location Lumbar Spine   concurrent with supine LE stretches     Manual Therapy   Manual Therapy Soft tissue mobilization    Soft tissue mobilization Addaday assisted STM Rt lumbar, Rt lateral and posterior hip                       PT Short Term Goals - 03/11/21 1333       PT SHORT TERM GOAL #1   Title The patient will demonstrate basic self care strategies and initial HEP    Status Achieved      PT SHORT TERM GOAL #2   Title The patient will report a 40% improvement in right lateral hip pain    Baseline 35-40%    Status Achieved      PT SHORT TERM GOAL #3   Title Improved lumbar flexion to 60 degrees and hip flexion to 110 degrees for greater ease with lifting    Baseline hip flexion 115 bil, lumbar flexion 60 deg    Status  Achieved      PT SHORT TERM GOAL #4   Title Improved  HS length to 70 degrees and hip flexor length to 10 degrees for improved mobility with work tasks/lifting    Status Achieved      PT SHORT TERM GOAL #5   Title Hip external rotation to 45 degrees on right needed for greater ease putting on socks/shoes    Status Achieved               PT Long Term Goals - 02/14/21 1410       PT LONG TERM GOAL #1   Title The patient will be independent with HEP and will return to lifting his home weights    Time 8    Period Weeks    Status New    Target Date 04/11/21      PT LONG TERM GOAL #2   Title The patient will report a 70% improvement in right hip pain with home and work ADLs    Time 8    Period Weeks    Status New      PT LONG TERM GOAL #3   Title Improved bil hip and trunk strength to grossly 4+/5 to 5/5 needed for lifting heavier weights    Time 8    Period Weeks    Status New      PT LONG TERM GOAL #4   Title The patient will have improved lumbopelvic and hip mobility for greater ease ascending and descending his parents basement steps and possible return to bowling    Time 8    Period Weeks    Status New      PT LONG TERM GOAL #5   Title FOTO score improved from 49% to 71%    Time 8    Period Weeks    Status New                   Plan - 03/26/21 1317     Clinical Impression Statement Pt is making steady gains towards improved  LE flexibility and hip ROM.  He is able to demo a full RDL today with excellent form.  He needs cueing throughout session to stand up straight vs flexed trunk posture now that his mobility allows for more upright posture.  This flexed trunk tendency may be habitual pattern due to Hx of tight hip flexors and lack of glute use (presents with more of a back gripper posture).  Added resisted walking today with good tolerance.  Pt reports no more sharp pains or trouble with going from sitting to standing or with stairs. He has less frequent  pain with bending and lifting at work. He does report ongoing Rt LBP within 5-10 min of walking.  PT plans to progress more core work next visit.    PT Frequency 2x / week    PT Duration 8 weeks    PT Treatment/Interventions ADLs/Self Care Home Management;Aquatic Therapy;Cryotherapy;Electrical Stimulation;Ultrasound;Moist Heat;Iontophoresis 4mg /ml Dexamethasone;Traction;Therapeutic activities;Therapeutic exercise;Neuromuscular re-education;Manual techniques;Patient/family education;Dry needling;Taping;Spinal Manipulations    PT Next Visit Plan Add more core to ther ex, Continue flexibility and progress strengthening as tolerated.  Addaday or STM to lumbopelvic hip area on Rt    PT Home Exercise Plan Access Code: [email or web address]    Consulted and Agree with Plan of Care Patient             Patient will benefit from skilled therapeutic intervention in order to improve the following deficits and impairments:     Visit Diagnosis: Pain in right hip  Stiffness of right hip, not elsewhere classified  Muscle weakness (generalized)  Stiffness of left hip, not elsewhere classified     Problem List Patient Active Problem List   Diagnosis Date Noted   Atypical chest pain 11/03/2016   Dyslipidemia 11/03/2016   Family history of peripheral arterial disease 11/03/2016   Smoker 11/03/2016   Family history of abdominal aortic aneurysm (AAA) 11/03/2016    11/05/2016, PT 03/26/21 1:23 PM   [facility] [address] [place], [place], [place] Phone: [phone]   Fax:  [phone]  Name: Tyler Dickerson MRN: Dareen Piano Date of Birth: 04-21-1965

## 2021-04-02 ENCOUNTER — Ambulatory Visit: Payer: Managed Care, Other (non HMO) | Admitting: Physical Therapy

## 2021-04-02 ENCOUNTER — Other Ambulatory Visit: Payer: Self-pay

## 2021-04-02 ENCOUNTER — Encounter: Payer: Self-pay | Admitting: Physical Therapy

## 2021-04-02 DIAGNOSIS — M25551 Pain in right hip: Secondary | ICD-10-CM

## 2021-04-02 DIAGNOSIS — M25652 Stiffness of left hip, not elsewhere classified: Secondary | ICD-10-CM

## 2021-04-02 DIAGNOSIS — M6281 Muscle weakness (generalized): Secondary | ICD-10-CM

## 2021-04-02 DIAGNOSIS — M25651 Stiffness of right hip, not elsewhere classified: Secondary | ICD-10-CM

## 2021-04-02 NOTE — Therapy (Signed)
[facility] [address] [place], [place], [address] Phone: [phone]   Fax:  [phone]  Physical Therapy Treatment  Patient Details  Name: Tyler Dickerson MRN: [id number] Date of Birth: 06-25-1965 Referring Provider (PT): Dr Darrow Bussingibas Koirala   Encounter Date: 04/02/2021   PT End of Session - 04/02/21 1108     Visit Number 10    Date for PT Re-Evaluation 04/11/21    Authorization Type [employer]    PT Start Time 1100    PT Stop Time 1145    PT Time Calculation (min) 45 min             Past Medical History:  Diagnosis Date   Hypercholesteremia    Mild   Hyperplastic colon polyp    Hypertension     Past Surgical History:  Procedure Laterality Date   COLONOSCOPY  2015   REPLACEMENT TOTAL KNEE Left 1996    There were no vitals filed for this visit.   Subjective Assessment - 04/02/21 1106     Subjective My Rt low back is always going to be sore I guess.  I feel better after my PT sessions but continue to get sore with heavy bending/lifting at work.    Pertinent History 1997 knee surgery didn't heal right    Limitations House hold activities;Lifting;Walking    How long can you sit comfortably? no pain with this anymore    How long can you walk comfortably? 10-15 min    Diagnostic tests none    Patient Stated Goals get rid of some of the pain;  I like to play with weights  a little bit; I'd like to pick up my grandson [id number] years old    Currently in Pain? Yes    Pain Score 5     Pain Location Back    Pain Orientation Right;Lower    Pain Descriptors / Indicators Sore    Pain Type Chronic pain    Pain Onset More than a month ago    Pain Frequency Intermittent    Aggravating Factors  walk > 15'                               [facility] Adult PT Treatment/Exercise - 04/02/21 0001       Therapeutic Activites    Therapeutic Activities Work Sara LeeSimulation    Work Civil engineer, contractingimulation wide stagger stance squat  and lift with exhale on effort      Exercises   Exercises Shoulder;Lumbar;Knee/Hip      Lumbar Exercises: Stretches   Double Knee to Chest Stretch 3 reps;10 seconds    Lower Trunk Rotation Limitations A/ROM x 10      Lumbar Exercises: Aerobic   Nustep L6 x 6' PT present to discuss progress      Lumbar Exercises: Machines for Strengthening   Leg Press 90lb bil 2x15 reps    Other Lumbar Machine Exercise lat pulldown 40lb x 15 reps, VC exhale on effort      Lumbar Exercises: Standing   Other Standing Lumbar Exercises RDL 15lb kbell x 15 reps    Other Standing Lumbar Exercises functional squat with 3 rows bil 8lb dumbbells x 5 cycles      Lumbar Exercises: Supine   Other Supine Lumbar Exercises heels on red physioball roll in/out with exhale and TA indraw x 20 reps      Knee/Hip Exercises: Stretches  Active Hamstring Stretch Both;3 reps;10 seconds    Active Hamstring Stretch Limitations foot on 2nd step    Hip Flexor Stretch Both    Hip Flexor Stretch Limitations foot on 2nd step 3x10" each Rt/Lt    Piriformis Stretch Both;2 reps;20 seconds      Knee/Hip Exercises: Machines for Strengthening   Hip Cybex 40lb bil hip ext and abd 1x10 each      Shoulder Exercises: Power [employer] Limitations low to high row bil x      Moist Heat Therapy   Number Minutes Moist Heat 10 Minutes    Moist Heat Location Lumbar Spine   concurrent with supine stretches                      PT Short Term Goals - 03/11/21 1333       PT SHORT TERM GOAL #1   Title The patient will demonstrate basic self care strategies and initial HEP    Status Achieved      PT SHORT TERM GOAL #2   Title The patient will report a 40% improvement in right lateral hip pain    Baseline 35-40%    Status Achieved      PT SHORT TERM GOAL #3   Title Improved lumbar flexion to 60 degrees and hip flexion to 110 degrees for greater ease with lifting    Baseline hip flexion 115 bil, lumbar flexion 60 deg     Status Achieved      PT SHORT TERM GOAL #4   Title Improved  HS length to 70 degrees and hip flexor length to 10 degrees for improved mobility with work tasks/lifting    Status Achieved      PT SHORT TERM GOAL #5   Title Hip external rotation to 45 degrees on right needed for greater ease putting on socks/shoes    Status Achieved               PT Long Term Goals - 02/14/21 1410       PT LONG TERM GOAL #1   Title The patient will be independent with HEP and will return to lifting his home weights    Time 8    Period Weeks    Status New    Target Date 04/11/21      PT LONG TERM GOAL #2   Title The patient will report a 70% improvement in right hip pain with home and work ADLs    Time 8    Period Weeks    Status New      PT LONG TERM GOAL #3   Title Improved bil hip and trunk strength to grossly 4+/5 to 5/5 needed for lifting heavier weights    Time 8    Period Weeks    Status New      PT LONG TERM GOAL #4   Title The patient will have improved lumbopelvic and hip mobility for greater ease ascending and descending his parents basement steps and possible return to bowling    Time 8    Period Weeks    Status New      PT LONG TERM GOAL #5   Title FOTO score improved from 49% to 71%    Time 8    Period Weeks    Status New                   Plan - 04/02/21 1138  Clinical Impression Statement Pt reports ongoing pain with physical work demands of bending and lifting.  Session included strategies for bending and lifting strategy simulation using stagger stance squat and dead lifts.  PT educated Pt on pressure management with exhale on effort cues.  Pt states he always feels better after PT sessions so PT encouraged Pt to apply bend/lift concepts at work with more mindfulness.  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    PT Frequency 2x / week    PT Duration 8  weeks    PT Next Visit Plan ERO next time with extension likely to continue work simulation and strengthening to meet demands of work tasks    PT Home Exercise Plan Access Code: [email or web address]    Consulted and Agree with Plan of Care Patient             Patient will benefit from skilled therapeutic intervention in order to improve the following deficits and impairments:     Visit Diagnosis: Pain in right hip  Stiffness of right hip, not elsewhere classified  Muscle weakness (generalized)  Stiffness of left hip, not elsewhere classified     Problem List Patient Active Problem List   Diagnosis Date Noted   Atypical chest pain 11/03/2016   Dyslipidemia 11/03/2016   Family history of peripheral arterial disease 11/03/2016   Smoker 11/03/2016   Family history of abdominal aortic aneurysm (AAA) 11/03/2016    Morton Peters, PT 04/02/21 11:44 AM   [place] [facility] [address] [place], [place], [address] Phone: [phone]   Fax:  [phone]  Name: Tyler Dickerson MRN: [id number] Date of Birth: 1966/02/06

## 2021-04-09 ENCOUNTER — Ambulatory Visit: Payer: Managed Care, Other (non HMO) | Attending: Family Medicine | Admitting: Physical Therapy

## 2021-04-09 ENCOUNTER — Other Ambulatory Visit: Payer: Self-pay

## 2021-04-09 ENCOUNTER — Encounter: Payer: Self-pay | Admitting: Physical Therapy

## 2021-04-09 DIAGNOSIS — M6281 Muscle weakness (generalized): Secondary | ICD-10-CM | POA: Diagnosis present

## 2021-04-09 DIAGNOSIS — M25551 Pain in right hip: Secondary | ICD-10-CM | POA: Diagnosis present

## 2021-04-09 DIAGNOSIS — M25651 Stiffness of right hip, not elsewhere classified: Secondary | ICD-10-CM | POA: Insufficient documentation

## 2021-04-09 DIAGNOSIS — M25652 Stiffness of left hip, not elsewhere classified: Secondary | ICD-10-CM | POA: Diagnosis present

## 2021-04-09 NOTE — Therapy (Signed)
[facility] [address] [place], [place], [address] Phone: [phone]   Fax:  [phone]  Physical Therapy Treatment  Patient Details  Name: Tyler Dickerson MRN: [id number] Date of Birth: 25-Dec-1965 Referring Provider (PT): Dr Darrow Bussing   Encounter Date: 04/09/2021   PT End of Session - 04/09/21 1102     Visit Number 11    Authorization Type [employer]    PT Start Time 1103    PT Stop Time 1145    PT Time Calculation (min) 42 min    Activity Tolerance Patient tolerated treatment well    Behavior During Therapy [facility] for tasks assessed/performed             Past Medical History:  Diagnosis Date   Hypercholesteremia    Mild   Hyperplastic colon polyp    Hypertension     Past Surgical History:  Procedure Laterality Date   COLONOSCOPY  2015   REPLACEMENT TOTAL KNEE Left 1996    There were no vitals filed for this visit.   Subjective Assessment - 04/09/21 1103     Subjective I feel 40% better.  I don't have any sharp pains with transitional movements anymore but do get sore at work with bend/lifting.  I can stand and walk about 30-60 min before soreness gets worse.  At work I get sore after about an hour of work.  It helped a lot to work on proper bend/lift last session and I used that at work and could do more before pain.    Pertinent History 1997 knee surgery didn't heal right    Limitations House hold activities;Lifting;Walking    How long can you sit comfortably? no pain with this anymore    How long can you walk comfortably? 30-60'    Diagnostic tests none    Patient Stated Goals get rid of some of the pain;  I like to play with weights  a little bit; I'd like to pick up my grandson 58 years old    Currently in Pain? Yes    Pain Score 0-No pain    Pain Location Back    Pain Orientation Right;Lower    Pain Onset More than a month ago    Pain Frequency Intermittent    Aggravating Factors  walk > 60',  bend/lift > 60' at work                [facility] PT Assessment - 04/09/21 0001       Assessment   Medical Diagnosis right hip pain    Referring Provider (PT) Dr Carilyn Goodpasture Docia Chuck    Onset Date/Surgical Date --   2 months   Next MD Visit as needed    Prior Therapy 1997 for knee      Observation/Other Assessments   Focus on Therapeutic Outcomes (FOTO)  66%      Posture/Postural Control   Postural Limitations Increased lumbar lordosis;Flexed trunk    Posture Comments able to correct habitual posture with cueing      AROM   Right Hip Flexion 115    Right Hip External Rotation  50    Right Hip Internal Rotation  10    Left Hip Flexion 115    Left Hip External Rotation  60    Left Hip Internal Rotation  10    Lumbar Flexion 55   HS limits   Lumbar Extension 15   pain     PROM  Overall PROM Comments Rt hip end range stiffness IR/ER compared to Lt      Strength   Right Hip Extension 5/5    Right Hip ABduction 5/5    Left Hip Extension 5/5    Left Hip ABduction 5/5    Lumbar Flexion 5/5    Lumbar Extension 5/5      Flexibility   Hamstrings lacks final 15 deg bil    Quadriceps Rt limited 35%      Palpation   Palpation comment tender along iliac crest on Rt, QL on Rt                           [facility] Adult PT Treatment/Exercise - 04/09/21 0001       Therapeutic Activites    Therapeutic Activities Work Counselling psychologistimulation    Work Simulation bend and lift 20lb box x 5 reps, footwork for quarter turns holding 20lb box at waist, carry 20lb box x 50' with postural awareness      Exercises   Exercises Shoulder;Lumbar;Knee/Hip      Lumbar Exercises: Machines for Strengthening   Leg Press 90lb bil 2x15 reps    Other Lumbar Machine Exercise lat pulldown x 15 reps, 30lb      Knee/Hip Exercises: Machines for Strengthening   Hip Cybex 40lb bil hip ext and abd 1x10 each                       PT Short Term Goals - 03/11/21 1333       PT SHORT TERM GOAL  #1   Title The patient will demonstrate basic self care strategies and initial HEP    Status Achieved      PT SHORT TERM GOAL #2   Title The patient will report a 40% improvement in right lateral hip pain    Baseline 35-40%    Status Achieved      PT SHORT TERM GOAL #3   Title Improved lumbar flexion to 60 degrees and hip flexion to 110 degrees for greater ease with lifting    Baseline hip flexion 115 bil, lumbar flexion 60 deg    Status Achieved      PT SHORT TERM GOAL #4   Title Improved  HS length to 70 degrees and hip flexor length to 10 degrees for improved mobility with work tasks/lifting    Status Achieved      PT SHORT TERM GOAL #5   Title Hip external rotation to 45 degrees on right needed for greater ease putting on socks/shoes    Status Achieved               PT Long Term Goals - 04/09/21 1107       PT LONG TERM GOAL #1   Title The patient will be independent with HEP and will return to lifting his home weights    Status On-going      PT LONG TERM GOAL #2   Title The patient will report a 70% improvement in right hip pain with home and work ADLs    Baseline 40%    Status On-going      PT LONG TERM GOAL #3   Title Improved bil hip and trunk strength to grossly 4+/5 to 5/5 needed for lifting heavier weights    Status Achieved      PT LONG TERM GOAL #4   Title The patient will have improved lumbopelvic and hip  mobility for greater ease ascending and descending his parents basement steps and possible return to bowling    Baseline steps are good but hasn't tried bowling    Status On-going      PT LONG TERM GOAL #5   Title FOTO score improved from 49% to 71%    Baseline 66%    Status On-going      Additional Long Term Goals   Additional Long Term Goals Yes      PT LONG TERM GOAL #6   Title Pt will demo proper body mechanics with bend/lift/transfer/carry loaded box up to 25lb x 10 reps and single transfer of loaded box x 50lb    Time 6    Period Weeks     Status New    Target Date 05/21/21      PT LONG TERM GOAL #7   Title Pt will report no to minimal pain with up to 2-3 hours of work at [employer] demonstrating increased tolerance of bend/lift/carry/load packages.    Time 6    Period Weeks    Status New    Target Date 05/21/21                   Plan - 04/09/21 1129     Clinical Impression Statement Pt reports no pain on arrival today for the first time since starting PT.  He focused on body mechanics training at work to avoid lift/bend/twist and said it helped a lot.  He was able to work x 1 hour before noting any symptoms last week.  He works at [employer] and has to lift 20-30lb boxes frequently and up to 75lb boxed occassionally.  He is up to 1 hour of walking before LBP (Rt sided).  Pt has improved LE strength to 5/5 bil.  Flexibility is much improved although Pt continues to have limited Rt quad length and bil hamstring length and has HEP to target continued stretching.  He is working on Chief Operating Officer.  PT is progressing bend/lift mechanics and plans to work on loaded box transfer and carry mechanics at future visits.  PT recommends taper to 1x/week x 6 more weeks to progress work related body mechanics and strength to allow for greater stretches of work with less pain.    Personal Factors and Comorbidities Profession    Examination-Activity Limitations Locomotion Level;Squat;Lift;Carry    Examination-Participation Restrictions Occupation;Community Activity    Stability/Clinical Decision Making Stable/Uncomplicated    Clinical Decision Making Low    Rehab Potential Good    PT Frequency 1x / week    PT Duration 6 weeks    PT Treatment/Interventions ADLs/Self Care Home Management;Aquatic Therapy;Cryotherapy;Electrical Stimulation;Ultrasound;Moist Heat;Iontophoresis 4mg /ml Dexamethasone;Traction;Therapeutic activities;Therapeutic exercise;Neuromuscular re-education;Manual techniques;Patient/family education;Dry  needling;Taping;Spinal Manipulations;Functional mobility training    PT Next Visit Plan work simulation with bend/lift/transfer/carry, machines for strength, LE quad and hamstring flexibility    PT Home Exercise Plan Access Code: [email or web address]    Consulted and Agree with Plan of Care Patient             Patient will benefit from skilled therapeutic intervention in order to improve the following deficits and impairments:  Improper body mechanics, Impaired flexibility, Pain, Decreased range of motion  Visit Diagnosis: Pain in right hip - Plan: PT plan of care cert/re-cert  Stiffness of right hip, not elsewhere classified - Plan: PT plan of care cert/re-cert  Muscle weakness (generalized) - Plan: PT plan of care cert/re-cert  Stiffness of left hip, not  elsewhere classified - Plan: PT plan of care cert/re-cert     Problem List Patient Active Problem List   Diagnosis Date Noted   Atypical chest pain 11/03/2016   Dyslipidemia 11/03/2016   Family history of peripheral arterial disease 11/03/2016   Smoker 11/03/2016   Family history of abdominal aortic aneurysm (AAA) 11/03/2016   Morton PetersJohanna Randi Poullard, PT 04/09/21 1:26 PM   [facility] [address] [place], [place], [address] Phone: [phone]   Fax:  [phone]  Name: Dareen PianoRachon A Galyon MRN: [id number] Date of Birth: [birth year]

## 2021-04-16 ENCOUNTER — Other Ambulatory Visit: Payer: Self-pay

## 2021-04-16 ENCOUNTER — Encounter: Payer: Self-pay | Admitting: Physical Therapy

## 2021-04-16 ENCOUNTER — Ambulatory Visit: Payer: Managed Care, Other (non HMO) | Admitting: Physical Therapy

## 2021-04-16 DIAGNOSIS — M6281 Muscle weakness (generalized): Secondary | ICD-10-CM

## 2021-04-16 DIAGNOSIS — M25651 Stiffness of right hip, not elsewhere classified: Secondary | ICD-10-CM

## 2021-04-16 DIAGNOSIS — M25551 Pain in right hip: Secondary | ICD-10-CM | POA: Diagnosis not present

## 2021-04-16 DIAGNOSIS — M25652 Stiffness of left hip, not elsewhere classified: Secondary | ICD-10-CM

## 2021-04-16 NOTE — Therapy (Signed)
[facility] [address] [place], [place], [address] Phone: [phone]   Fax:  [phone]  Physical Therapy Treatment  Patient Details  Name: Tyler Dickerson MRN: [id number] Date of Birth: 04/28/1965 Referring Provider (PT): Dr Darrow Bussing   Encounter Date: 04/16/2021   PT End of Session - 04/16/21 1148     Visit Number 12    Date for PT Re-Evaluation 05/21/21    Authorization Type [employer]    PT Start Time 1145    PT Stop Time 1223    PT Time Calculation (min) 38 min    Activity Tolerance Patient tolerated treatment well    Behavior During Therapy [facility] for tasks assessed/performed             Past Medical History:  Diagnosis Date   Hypercholesteremia    Mild   Hyperplastic colon polyp    Hypertension     Past Surgical History:  Procedure Laterality Date   COLONOSCOPY  2015   REPLACEMENT TOTAL KNEE Left 1996    There were no vitals filed for this visit.   Subjective Assessment - 04/16/21 1149     Subjective I keep forgetting to lift properly and my pain goes up at work.    Pertinent History 1997 knee surgery didn't heal right    Limitations House hold activities;Lifting;Walking    How long can you sit comfortably? no pain with this anymore    How long can you walk comfortably? 30-60'    Diagnostic tests none    Patient Stated Goals get rid of some of the pain;  I like to play with weights  a little bit; I'd like to pick up my grandson 58 years old    Currently in Pain? Yes    Pain Score 4     Pain Location Back    Pain Orientation Right;Lower    Pain Descriptors / Indicators Sore    Pain Type Chronic pain    Pain Frequency Intermittent    Aggravating Factors  lifting                               [facility] Adult PT Treatment/Exercise - 04/16/21 0001       Therapeutic Activites    Therapeutic Activities Work Simulation    Work Advertising copywriter from [employer]  table to floor and back x 5 rounds      Exercises   Exercises Shoulder;Lumbar;Knee/Hip      Lumbar Exercises: Doctor, hospital Left;Right;2 reps;20 seconds      Lumbar Exercises: Aerobic   Nustep L4 x 6' PT present to discuss progress      Lumbar Exercises: Machines for Strengthening   Leg Press 95lb bil 2x15 reps    Other Lumbar Machine Exercise lat pulldown x 15 reps, 30lb      Lumbar Exercises: Standing   Functional Squats 10 reps    Functional Squats Limitations 20lb kbell to floor, VC stand tall each rep      Knee/Hip Exercises: Stretches   Active Hamstring Stretch Both;2 reps;20 seconds    Active Hamstring Stretch Limitations seated    Hip Flexor Stretch Left;Right;2 reps;20 seconds    Hip Flexor Stretch Limitations foot on 2nd step with overhead reach      Knee/Hip Exercises: Machines for Strengthening   Hip Cybex 40lb 1x10 each hip abd/ext      Shoulder  Exercises: Standing   Row Limitations 1/4 squat to 3x row 7lb bil dumbbells x 10 rounds                       PT Short Term Goals - 03/11/21 1333       PT SHORT TERM GOAL #1   Title The patient will demonstrate basic self care strategies and initial HEP    Status Achieved      PT SHORT TERM GOAL #2   Title The patient will report a 40% improvement in right lateral hip pain    Baseline 35-40%    Status Achieved      PT SHORT TERM GOAL #3   Title Improved lumbar flexion to 60 degrees and hip flexion to 110 degrees for greater ease with lifting    Baseline hip flexion 115 bil, lumbar flexion 60 deg    Status Achieved      PT SHORT TERM GOAL #4   Title Improved  HS length to 70 degrees and hip flexor length to 10 degrees for improved mobility with work tasks/lifting    Status Achieved      PT SHORT TERM GOAL #5   Title Hip external rotation to 45 degrees on right needed for greater ease putting on socks/shoes    Status Achieved               PT Long Term Goals - 04/09/21 1107        PT LONG TERM GOAL #1   Title The patient will be independent with HEP and will return to lifting his home weights    Status On-going      PT LONG TERM GOAL #2   Title The patient will report a 70% improvement in right hip pain with home and work ADLs    Baseline 40%    Status On-going      PT LONG TERM GOAL #3   Title Improved bil hip and trunk strength to grossly 4+/5 to 5/5 needed for lifting heavier weights    Status Achieved      PT LONG TERM GOAL #4   Title The patient will have improved lumbopelvic and hip mobility for greater ease ascending and descending his parents basement steps and possible return to bowling    Baseline steps are good but hasn't tried bowling    Status On-going      PT LONG TERM GOAL #5   Title FOTO score improved from 49% to 71%    Baseline 66%    Status On-going      Additional Long Term Goals   Additional Long Term Goals Yes      PT LONG TERM GOAL #6   Title Pt will demo proper body mechanics with bend/lift/transfer/carry loaded box up to 25lb x 10 reps and single transfer of loaded box x 50lb    Time 6    Period Weeks    Status New    Target Date 05/21/21      PT LONG TERM GOAL #7   Title Pt will report no to minimal pain with up to 2-3 hours of work at [employer] demonstrating increased tolerance of bend/lift/carry/load packages.    Time 6    Period Weeks    Status New    Target Date 05/21/21                   Plan - 04/16/21 1205     Clinical Impression  Statement Pt continues to have pain at work with lifting demands.  He admits he forgets to think about technique/body mechanics and gets reminded by pain that he isn't using proper form.  He is able to demo proper form with work task simulation and squats with initial cueing.  PT reiterated importance of conscious effort to apply this at work.  He needs cues to get pelvis aligned under him when transitioning from squat to stand and when carrying loaded box vs remaining in  slight lumbar flexion/lordotic position.  Continue along POC.    Personal Factors and Comorbidities Profession    PT Frequency 1x / week    PT Duration 6 weeks    PT Treatment/Interventions ADLs/Self Care Home Management;Aquatic Therapy;Cryotherapy;Electrical Stimulation;Ultrasound;Moist Heat;Iontophoresis 4mg /ml Dexamethasone;Traction;Therapeutic activities;Therapeutic exercise;Neuromuscular re-education;Manual techniques;Patient/family education;Dry needling;Taping;Spinal Manipulations;Functional mobility training    PT Next Visit Plan work simulation with bend/lift/transfer/carry, machines for strength, LE quad and hamstring flexibility    PT Home Exercise Plan Access Code: [email or web address]    Consulted and Agree with Plan of Care Patient             Patient will benefit from skilled therapeutic intervention in order to improve the following deficits and impairments:     Visit Diagnosis: Pain in right hip  Stiffness of right hip, not elsewhere classified  Muscle weakness (generalized)  Stiffness of left hip, not elsewhere classified     Problem List Patient Active Problem List   Diagnosis Date Noted   Atypical chest pain 11/03/2016   Dyslipidemia 11/03/2016   Family history of peripheral arterial disease 11/03/2016   Smoker 11/03/2016   Family history of abdominal aortic aneurysm (AAA) 11/03/2016    11/05/2016, PT 04/16/21 12:25 PM   [place] [facility] [address] [place], [place], [place] Phone: [phone]   Fax:  [phone]  Name: JAMIRE SHABAZZ MRN: Dareen Piano Date of Birth: [birth year]

## 2021-04-30 ENCOUNTER — Other Ambulatory Visit: Payer: Self-pay

## 2021-04-30 ENCOUNTER — Ambulatory Visit: Payer: Managed Care, Other (non HMO) | Admitting: Physical Therapy

## 2021-04-30 ENCOUNTER — Encounter: Payer: Self-pay | Admitting: Physical Therapy

## 2021-04-30 DIAGNOSIS — M6281 Muscle weakness (generalized): Secondary | ICD-10-CM

## 2021-04-30 DIAGNOSIS — M25651 Stiffness of right hip, not elsewhere classified: Secondary | ICD-10-CM

## 2021-04-30 DIAGNOSIS — M25551 Pain in right hip: Secondary | ICD-10-CM | POA: Diagnosis not present

## 2021-04-30 DIAGNOSIS — M25652 Stiffness of left hip, not elsewhere classified: Secondary | ICD-10-CM

## 2021-04-30 NOTE — Therapy (Signed)
[facility] [address] [place], [place], [address] Phone: [phone]   Fax:  [phone]  Physical Therapy Treatment  Patient Details  Name: Tyler Dickerson MRN: [id number] Date of Birth: Apr 08, 1965 Referring Provider (PT): Dr Darrow Bussing   Encounter Date: 04/30/2021   PT End of Session - 04/30/21 1150     Visit Number 13    Date for PT Re-Evaluation 05/21/21    Authorization Type [employer]    PT Start Time 1146    PT Stop Time 1228    PT Time Calculation (min) 42 min    Activity Tolerance Patient tolerated treatment well    Behavior During Therapy [facility] for tasks assessed/performed             Past Medical History:  Diagnosis Date   Hypercholesteremia    Mild   Hyperplastic colon polyp    Hypertension     Past Surgical History:  Procedure Laterality Date   COLONOSCOPY  2015   REPLACEMENT TOTAL KNEE Left 1996    There were no vitals filed for this visit.   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    Pertinent History 1997 knee surgery didn't heal right    Limitations House hold activities;Lifting;Walking    How long can you sit comfortably? no pain with this anymore    How long can you walk comfortably? 30-60'    Diagnostic tests none    Patient Stated Goals get rid of some of the pain;  I like to play with weights  a little bit; I'd like to pick up my grandson 37 years old    Currently in Pain? 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 x 5 rounds      Exercises   Exercises Shoulder;Lumbar;Knee/Hip      Lumbar Exercises: Programme researcher, broadcasting/film/video 2 reps;20 seconds    Active Hamstring Stretch Limitations seated    Theme park manager Left;Right;2 reps;20 seconds    Gastroc Stretch Limitations slant board      Lumbar Exercises: Aerobic   Nustep L4 x 5' PT present to discuss symptoms at work      Lumbar Exercises: Scientist, water quality for Strengthening   Leg Press 100lb 2x10 bil LEs    Other Lumbar Machine Exercise lat pulldown x 15 reps, 30lb      Lumbar Exercises: Seated   Other Seated Lumbar Exercises reverse sit up holding 7lb dumbbell, static reverse sit up with diag dumbbell hip to shoulder x 20 each way      Knee/Hip Exercises: Machines for Strengthening   Hip Cybex 55lb 1x10 each hip abd, ext and flexion  Knee/Hip Exercises: Standing   Other Standing Knee Exercises farmer carry around ortho gym Rt/Lt 20lb kbell x 2 laps each, alt      Shoulder Exercises: Standing   Row Limitations 1/4 squat to 3x row 7lb bil dumbbells x 10 rounds                       PT Short Term Goals - 03/11/21 1333       PT SHORT TERM GOAL #1   Title The patient will demonstrate basic self care strategies and initial HEP    Status Achieved      PT SHORT TERM GOAL #2   Title The patient will report a 40% improvement in right lateral hip pain    Baseline 35-40%    Status Achieved      PT SHORT TERM GOAL #3   Title Improved lumbar flexion to 60 degrees and hip flexion to 110 degrees for greater ease with lifting    Baseline hip flexion 115 bil, lumbar flexion 60 deg    Status Achieved      PT SHORT TERM GOAL #4   Title Improved  HS length to 70 degrees and hip flexor length to 10 degrees for improved mobility with work tasks/lifting    Status Achieved      PT SHORT TERM GOAL #5   Title Hip external rotation to  45 degrees on right needed for greater ease putting on socks/shoes    Status Achieved               PT Long Term Goals - 04/09/21 1107       PT LONG TERM GOAL #1   Title The patient will be independent with HEP and will return to lifting his home weights    Status On-going      PT LONG TERM GOAL #2   Title The patient will report a 70% improvement in right hip pain with home and work ADLs    Baseline 40%    Status On-going      PT LONG TERM GOAL #3   Title Improved bil hip and trunk strength to grossly 4+/5 to 5/5 needed for lifting heavier weights    Status Achieved      PT LONG TERM GOAL #4   Title The patient will have improved lumbopelvic and hip mobility for greater ease ascending and descending his parents basement steps and possible return to bowling    Baseline steps are good but hasn't tried bowling    Status On-going      PT LONG TERM GOAL #5   Title FOTO score improved from 49% to 71%    Baseline 66%    Status On-going      Additional Long Term Goals   Additional Long Term Goals Yes      PT LONG TERM GOAL #6   Title Pt will demo proper body mechanics with bend/lift/transfer/carry loaded box up to 25lb x 10 reps and single transfer of loaded box x 50lb    Time 6    Period Weeks    Status New    Target Date 05/21/21      PT LONG TERM GOAL #7   Title Pt will report no to minimal pain with up to 2-3 hours of work at [employer] demonstrating increased tolerance of bend/lift/carry/load packages.    Time 6    Period Weeks    Status New    Target Date 05/21/21  Plan - 04/30/21 1153     Clinical Impression Statement Pt reports reduced severity of pain at work with use of proper body mechanics reinforced with PT ther ex targeting bend/lift/transfer and carry techniques.  LE, core, and lumbar strength are all improving and he is needing less cueing with proper techniques within sessions.  Hamstring flexibilty is improving allowing greater  excurion of standing T.  Pt was able to progress leg press and hip cybex resistance today.  PT added reverse sit ups, farmer carry with 20lb kbell and noted Pt was more erect with squat to stand and during farmer carry today.  PT added hip flexion to hip cybex and cued connection of march on cybex to core.  Pt had some mild LBP with 20lb box transfers from mat table to floor and back x 5 reps today.  Continue along POC.    PT Frequency 1x / week    PT Duration 6 weeks    PT Treatment/Interventions ADLs/Self Care Home Management;Aquatic Therapy;Cryotherapy;Electrical Stimulation;Ultrasound;Moist Heat;Iontophoresis 4mg /ml Dexamethasone;Traction;Therapeutic activities;Therapeutic exercise;Neuromuscular re-education;Manual techniques;Patient/family education;Dry needling;Taping;Spinal Manipulations;Functional mobility training    PT Next Visit Plan work simulation with bend/lift/transfer/carry, machines for strength, LE quad and hamstring flexibility    PT Home Exercise Plan Access Code: [email or web address]    Consulted and Agree with Plan of Care Patient             Patient will benefit from skilled therapeutic intervention in order to improve the following deficits and impairments:     Visit Diagnosis: Pain in right hip  Stiffness of right hip, not elsewhere classified  Muscle weakness (generalized)  Stiffness of left hip, not elsewhere classified     Problem List Patient Active Problem List   Diagnosis Date Noted   Atypical chest pain 11/03/2016   Dyslipidemia 11/03/2016   Family history of peripheral arterial disease 11/03/2016   Smoker 11/03/2016   Family history of abdominal aortic aneurysm (AAA) 11/03/2016    Morton Peters, PT 04/30/21 12:29 PM   [facility] [address] [place], [place], [address] Phone: [phone]   Fax:  [phone]  Name: Tyler Dickerson MRN: [id number] Date of Birth: [birth year]

## 2021-05-06 ENCOUNTER — Ambulatory Visit: Payer: Managed Care, Other (non HMO) | Admitting: Physical Therapy

## 2021-05-06 ENCOUNTER — Encounter: Payer: Self-pay | Admitting: Physical Therapy

## 2021-05-06 ENCOUNTER — Other Ambulatory Visit: Payer: Self-pay

## 2021-05-06 DIAGNOSIS — M6281 Muscle weakness (generalized): Secondary | ICD-10-CM

## 2021-05-06 DIAGNOSIS — M25551 Pain in right hip: Secondary | ICD-10-CM

## 2021-05-06 DIAGNOSIS — M25651 Stiffness of right hip, not elsewhere classified: Secondary | ICD-10-CM

## 2021-05-06 DIAGNOSIS — M25652 Stiffness of left hip, not elsewhere classified: Secondary | ICD-10-CM

## 2021-05-06 NOTE — Therapy (Signed)
[facility] [address] [place], [place], [address] Phone: [phone]   Fax:  [phone]  Physical Therapy Treatment  Patient Details  Name: Tyler Dickerson MRN: [id number] Date of Birth: 1965-03-15 Referring Provider (PT): Dr Darrow Bussing   Encounter Date: 05/06/2021   PT End of Session - 05/06/21 1153     Visit Number 14    Date for PT Re-Evaluation 05/21/21    Authorization Type [employer]    PT Start Time 1146    PT Stop Time 1229    PT Time Calculation (min) 43 min    Activity Tolerance Patient tolerated treatment well    Behavior During Therapy [facility] for tasks assessed/performed             Past Medical History:  Diagnosis Date   Hypercholesteremia    Mild   Hyperplastic colon polyp    Hypertension     Past Surgical History:  Procedure Laterality Date   COLONOSCOPY  2015   REPLACEMENT TOTAL KNEE Left 1996    There were no vitals filed for this visit.   Subjective Assessment - 05/06/21 1153     Subjective I have been doing a lot of lifting today so pain is up to 5-6/10 right now.  PT helps me loosen up and pain will go down.    Pertinent History 1997 knee surgery didn't heal right    How long can you sit comfortably? no pain with this anymore    How long can you walk comfortably? 30-60'    Diagnostic tests none    Patient Stated Goals get rid of some of the pain;  I like to play with weights  a little bit; I'd like to pick up my grandson 58 years old    Currently in Pain? Yes    Pain Score 5     Pain Location Back    Pain Orientation Right;Lower    Pain Descriptors / Indicators Sore    Pain Type Chronic pain                               [facility] Adult PT Treatment/Exercise - 05/06/21 0001       Exercises   Exercises Shoulder;Lumbar;Knee/Hip      Lumbar Exercises: Stretches   Single Knee to Chest Stretch 2 reps;20 seconds    Single Knee to Chest Stretch Limitations opp  leg straight for hip flexor stretch    Lower Trunk Rotation 3 reps;10 seconds    Quad Stretch 2 reps;30 seconds    Quad Stretch Limitations prone with strap    Piriformis Stretch Left;Right;1 rep;30 seconds    Piriformis Stretch Limitations edge of mat table with overpressure      Lumbar Exercises: Sidelying   Other Sidelying Lumbar Exercises open books x 5 each side      Knee/Hip Exercises: Stretches   Hip Flexor Stretch Both;1 rep;30 seconds    Hip Flexor Stretch Limitations off edge of mat table      Knee/Hip Exercises: Machines for Strengthening   Total Gym Leg Press 100lb bil LEs 2x15    Hip Cybex 55lb 1x10 each hip abd, ext      Knee/Hip Exercises: Standing   Other Standing Knee Exercises standing T 10lb kbell 5 reps      Shoulder Exercises: Power [employer] 20 reps    Row Limitations 20lb    Other  Power Games developer 15 reps 30lb                       PT Short Term Goals - 03/11/21 1333       PT SHORT TERM GOAL #1   Title The patient will demonstrate basic self care strategies and initial HEP    Status Achieved      PT SHORT TERM GOAL #2   Title The patient will report a 40% improvement in right lateral hip pain    Baseline 35-40%    Status Achieved      PT SHORT TERM GOAL #3   Title Improved lumbar flexion to 60 degrees and hip flexion to 110 degrees for greater ease with lifting    Baseline hip flexion 115 bil, lumbar flexion 60 deg    Status Achieved      PT SHORT TERM GOAL #4   Title Improved  HS length to 70 degrees and hip flexor length to 10 degrees for improved mobility with work tasks/lifting    Status Achieved      PT SHORT TERM GOAL #5   Title Hip external rotation to 45 degrees on right needed for greater ease putting on socks/shoes    Status Achieved               PT Long Term Goals - 04/09/21 1107       PT LONG TERM GOAL #1   Title The patient will be independent with HEP and will return to lifting his  home weights    Status On-going      PT LONG TERM GOAL #2   Title The patient will report a 70% improvement in right hip pain with home and work ADLs    Baseline 40%    Status On-going      PT LONG TERM GOAL #3   Title Improved bil hip and trunk strength to grossly 4+/5 to 5/5 needed for lifting heavier weights    Status Achieved      PT LONG TERM GOAL #4   Title The patient will have improved lumbopelvic and hip mobility for greater ease ascending and descending his parents basement steps and possible return to bowling    Baseline steps are good but hasn't tried bowling    Status On-going      PT LONG TERM GOAL #5   Title FOTO score improved from 49% to 71%    Baseline 66%    Status On-going      Additional Long Term Goals   Additional Long Term Goals Yes      PT LONG TERM GOAL #6   Title Pt will demo proper body mechanics with bend/lift/transfer/carry loaded box up to 25lb x 10 reps and single transfer of loaded box x 50lb    Time 6    Period Weeks    Status New    Target Date 05/21/21      PT LONG TERM GOAL #7   Title Pt will report no to minimal pain with up to 2-3 hours of work at [employer] demonstrating increased tolerance of bend/lift/carry/load packages.    Time 6    Period Weeks    Status New    Target Date 05/21/21                   Plan - 05/06/21 1213     Clinical Impression Statement Pt had moved some tree limbs this AM  and had some increased pain on arrival, rated 5-6/10.  Pt does report he uses his knowledge of lifting mechanics which has helped a lot. He states movement and exercise continue to loosen him up.  Session focused on targeted LE and trunk flexibility given Pt was so active this AM and pain was up.  He continues to have signif tightness in Lt>Rt quad, hip flexor and hamstrings.  Lumbar posture and body mechanics are improving as LE flexibility improves.  Pt demo'd improved posture during weight training end of session after front-loading  stretching.  Pt is nearing readiness to d/c to HEP in several weeks.    Personal Factors and Comorbidities Profession    Rehab Potential Good    PT Frequency 1x / week    PT Duration 6 weeks    PT Treatment/Interventions ADLs/Self Care Home Management;Aquatic Therapy;Cryotherapy;Electrical Stimulation;Ultrasound;Moist Heat;Iontophoresis 4mg /ml Dexamethasone;Traction;Therapeutic activities;Therapeutic exercise;Neuromuscular re-education;Manual techniques;Patient/family education;Dry needling;Taping;Spinal Manipulations;Functional mobility training    PT Next Visit Plan work simulation with bend/lift/transfer/carry, machines for strength, LE quad, hip flexor and hamstring flexibility    PT Home Exercise Plan Access Code: [email or web address]    Consulted and Agree with Plan of Care Patient             Patient will benefit from skilled therapeutic intervention in order to improve the following deficits and impairments:  Improper body mechanics, Impaired flexibility, Pain, Decreased range of motion  Visit Diagnosis: Pain in right hip  Stiffness of right hip, not elsewhere classified  Muscle weakness (generalized)  Stiffness of left hip, not elsewhere classified     Problem List Patient Active Problem List   Diagnosis Date Noted   Atypical chest pain 11/03/2016   Dyslipidemia 11/03/2016   Family history of peripheral arterial disease 11/03/2016   Smoker 11/03/2016   Family history of abdominal aortic aneurysm (AAA) 11/03/2016    11/05/2016, PT 05/06/21 12:30 PM   [facility] [address] [place], [place], [place] Phone: [phone]   Fax:  [phone]  Name: MILLER LIMEHOUSE MRN: Dareen Piano Date of Birth: 25-Oct-1965

## 2021-05-14 ENCOUNTER — Encounter: Payer: Self-pay | Admitting: Physical Therapy

## 2021-05-14 ENCOUNTER — Other Ambulatory Visit: Payer: Self-pay

## 2021-05-14 ENCOUNTER — Ambulatory Visit: Payer: Managed Care, Other (non HMO) | Attending: Family Medicine | Admitting: Physical Therapy

## 2021-05-14 DIAGNOSIS — M6281 Muscle weakness (generalized): Secondary | ICD-10-CM | POA: Insufficient documentation

## 2021-05-14 DIAGNOSIS — M25651 Stiffness of right hip, not elsewhere classified: Secondary | ICD-10-CM | POA: Insufficient documentation

## 2021-05-14 DIAGNOSIS — M25551 Pain in right hip: Secondary | ICD-10-CM | POA: Insufficient documentation

## 2021-05-14 DIAGNOSIS — M25652 Stiffness of left hip, not elsewhere classified: Secondary | ICD-10-CM | POA: Insufficient documentation

## 2021-05-14 NOTE — Therapy (Signed)
[facility] [place] ?[place], [place], [address] ?Phone: [phone]   Fax:  [phone] ? ?Physical Therapy Treatment ? ?Patient Details  ?Name: Tyler Dickerson ?MRN: [id number] ?Date of Birth: [birth year] ?Referring Provider (PT): Dr Lauretta Grill Dorthy Cooler ? ? ?Encounter Date: 05/14/2021 ? ? PT End of Session - 05/14/21 1206   ? ? Visit Number 15   ? Date for PT Re-Evaluation 05/21/21   ? Authorization Type [employer]   ? PT Start Time 1145   ? PT Stop Time 1227   ? PT Time Calculation (min) 42 min   ? Activity Tolerance Patient tolerated treatment well   ? Behavior During Therapy [facility] for tasks assessed/performed   ? ?  ?  ? ?  ? ? ?Past Medical History:  ?Diagnosis Date  ? Hypercholesteremia   ? Mild  ? Hyperplastic colon polyp   ? Hypertension   ? ? ?Past Surgical History:  ?Procedure Laterality Date  ? COLONOSCOPY  2015  ? REPLACEMENT TOTAL KNEE Left 1996  ? ? ?There were no vitals filed for this visit. ? ? Subjective Assessment - 05/14/21 1207   ? ? Subjective feeling good today. PT has helped a lot   ? Pertinent History 1997 knee surgery didn't heal right   ? Limitations House hold activities;Lifting;Walking   ? How long can you sit comfortably? no pain with this anymore   ? How long can you walk comfortably? 30-60'   ? Diagnostic tests none   ? Patient Stated Goals get rid of some of the pain;  I like to play with weights  a little bit; I'd like to pick up my grandson 58 years old   ? Pain Score 0-No pain   ? ?  ?  ? ?  ? ? ? ? ? ? ? ? ? ? ? ? ? ? ? ? ? ? ? ? [facility] Adult PT Treatment/Exercise - 05/14/21 0001   ? ?  ? Therapeutic Activites   ? Therapeutic Activities Work Simulation   ? Work Production assistant, radio squat lifts from floor, single box lift and floor transfer 40lb box   ?  ? Exercises  ? Exercises Shoulder;Lumbar;Knee/Hip   ?  ? Lumbar Exercises: Stretches  ? Quad Stretch 2 reps;30 seconds   ? Quad Stretch Limitations prone with strap   ? Other Lumbar  Stretch Exercise open books bil x 10   ? Other Lumbar Stretch Exercise bil hip flexors with overhead reach foot on 2nd step x 10 reps   ?  ? Lumbar Exercises: Machines for Strengthening  ? Leg Press 2x10 100lb   ?  ? Knee/Hip Exercises: Machines for Strengthening  ? Hip Cybex 55lb 1x10 each hip abd, ext   ?  ? Shoulder Exercises: Power Tower  ? Row 20 reps   ? Row Limitations 2x10 20lb   ? Other Power Location manager 10 reps 45lb   ? ?  ?  ? ?  ? ? ? ? ? ? ? ? ? ? ? ? PT Short Term Goals - 03/11/21 1333   ? ?  ? PT SHORT TERM GOAL #1  ? Title The patient will demonstrate basic self care strategies and initial HEP   ? Status Achieved   ?  ? PT SHORT TERM GOAL #2  ? Title The patient will report a 40% improvement in right lateral hip pain   ? Baseline 35-40%   ?  Status Achieved   ?  ? PT SHORT TERM GOAL #3  ? Title Improved lumbar flexion to 60 degrees and hip flexion to 110 degrees for greater ease with lifting   ? Baseline hip flexion 115 bil, lumbar flexion 60 deg   ? Status Achieved   ?  ? PT SHORT TERM GOAL #4  ? Title Improved  HS length to 70 degrees and hip flexor length to 10 degrees for improved mobility with work tasks/lifting   ? Status Achieved   ?  ? PT SHORT TERM GOAL #5  ? Title Hip external rotation to 45 degrees on right needed for greater ease putting on socks/shoes   ? Status Achieved   ? ?  ?  ? ?  ? ? ? ? PT Long Term Goals - 05/14/21 1222   ? ?  ? PT LONG TERM GOAL #1  ? Title The patient will be independent with HEP and will return to lifting his home weights   ? Status Achieved   ?  ? PT LONG TERM GOAL #2  ? Title The patient will report a 70% improvement in right hip pain with home and work ADLs   ? Baseline 50%   ? Status Partially Met   ?  ? PT LONG TERM GOAL #3  ? Title Improved bil hip and trunk strength to grossly 4+/5 to 5/5 needed for lifting heavier weights   ? Status Achieved   ?  ? PT LONG TERM GOAL #4  ? Title The patient will have improved lumbopelvic and hip  mobility for greater ease ascending and descending his parents basement steps and possible return to bowling   ? Baseline steps are good but hasn't tried bowling   ? Status Partially Met   ?  ? PT LONG TERM GOAL #5  ? Title FOTO score improved from 49% to 71%   ? Baseline 66%   ?  ? PT LONG TERM GOAL #6  ? Title Pt will demo proper body mechanics with bend/lift/transfer/carry loaded box up to 25lb x 10 reps and single transfer of loaded box x 50lb   ? Status Achieved   ?  ? PT LONG TERM GOAL #7  ? Title Pt will report no to minimal pain with up to 2-3 hours of work at [employer] demonstrating increased tolerance of bend/lift/carry/load packages.   ? Status Achieved   ? ?  ?  ? ?  ? ? ? ? ? ? ? ? Plan - 05/14/21 1224   ? ? Clinical Impression Statement Pt reports 50% improvement with PT.  He achieved LTG today for box lift transfers 25-50lb for work simulation. He is on track to meet most goals by ERO next visit and has reached a plateau.  He will need to continue working on LE flexibility and focus on ongoing proper body mechanics for heavy lifting at work.  Pt arrived today painfree and was able to perform all ther ex without exacerbation of pain. ERO with d/c likely next visit.   ? PT Frequency 1x / week   ? PT Duration 6 weeks   ? PT Treatment/Interventions ADLs/Self Care Home Management;Aquatic Therapy;Cryotherapy;Electrical Stimulation;Ultrasound;Moist Heat;Iontophoresis 46m/ml Dexamethasone;Traction;Therapeutic activities;Therapeutic exercise;Neuromuscular re-education;Manual techniques;Patient/family education;Dry needling;Taping;Spinal Manipulations;Functional mobility training   ? PT Next Visit Plan ERO and d/c to HEP   ? PT Home Exercise Plan Access Code: [email or web address]  ? Consulted and Agree with Plan of Care Patient   ? ?  ?  ? ?  ? ? ?  Patient will benefit from skilled therapeutic intervention in order to improve the following deficits and impairments:    ? ?Visit Diagnosis: ?Pain in right hip ? ?Stiffness of  right hip, not elsewhere classified ? ?Muscle weakness (generalized) ? ?Stiffness of left hip, not elsewhere classified ? ? ? ? ?Problem List ?Patient Active Problem List  ? Diagnosis Date Noted  ? Atypical chest pain 11/03/2016  ? Dyslipidemia 11/03/2016  ? Family history of peripheral arterial disease 11/03/2016  ? Smoker 11/03/2016  ? Family history of abdominal aortic aneurysm (AAA) 11/03/2016  ? ? ?Baruch Merl, PT ?05/14/21 12:27 PM ? ? ?[facility] ?[facility] [place] ?[place], [place], [address] ?Phone: [phone]   Fax:  [phone] ? ?Name: Tyler Dickerson ?MRN: [id number] ?Date of Birth: 11-12-1965 ? ? ? ?

## 2021-05-20 ENCOUNTER — Ambulatory Visit: Payer: Managed Care, Other (non HMO) | Admitting: Physical Therapy

## 2021-05-20 ENCOUNTER — Encounter: Payer: Self-pay | Admitting: Physical Therapy

## 2021-05-20 ENCOUNTER — Other Ambulatory Visit: Payer: Self-pay

## 2021-05-20 DIAGNOSIS — M25551 Pain in right hip: Secondary | ICD-10-CM

## 2021-05-20 DIAGNOSIS — M25651 Stiffness of right hip, not elsewhere classified: Secondary | ICD-10-CM

## 2021-05-20 DIAGNOSIS — M25652 Stiffness of left hip, not elsewhere classified: Secondary | ICD-10-CM

## 2021-05-20 DIAGNOSIS — M6281 Muscle weakness (generalized): Secondary | ICD-10-CM

## 2021-05-20 NOTE — Therapy (Signed)
[facility] ?[place], [place], [address] ?Phone: [phone]   Fax:  [phone] ? ?Physical Therapy Treatment ? ?Patient Details  ?Name: Tyler Dickerson ?MRN: [id number] ?Date of Birth: 11-26-1965 ?Referring Provider (PT): Dr Lauretta Grill Dorthy Cooler ? ? ?Encounter Date: 05/20/2021 ? ? PT End of Session - 05/20/21 1148   ? ? Visit Number [id number]   ? Date for PT Re-Evaluation 05/21/21   ? Authorization Type Cigna   ? PT Start Time 1145   ? Activity Tolerance Patient tolerated treatment well   ? Behavior During Therapy [facility] for tasks assessed/performed   ? ?  ?  ? ?  ? ? ?Past Medical History:  ?Diagnosis Date  ? Hypercholesteremia   ? Mild  ? Hyperplastic colon polyp   ? Hypertension   ? ? ?Past Surgical History:  ?Procedure Laterality Date  ? COLONOSCOPY  2015  ? REPLACEMENT TOTAL KNEE Left 1996  ? ? ?There were no vitals filed for this visit. ? ? Subjective Assessment - 05/20/21 1148   ? ? Subjective Overall 50% improvement in hip and back pain since starting PT.  I understand how to lift at work which helps me have less pain on the job.  I may have slept wrong last night b/c my pain is up a little bit today.  Otherwise I had a weekend with no pain.   ? Pertinent History 1997 knee surgery didn't heal right   ? Limitations House hold activities;Lifting;Walking   ? How long can you sit comfortably? no pain with this anymore   ? How long can you walk comfortably? 30-60'   ? Diagnostic tests none   ? Patient Stated Goals get rid of some of the pain;  I like to play with weights  a little bit; I'd like to pick up my grandson 58 years old   ? Currently in Pain? Yes   ? Pain Score 6    ? Pain Location Back   ? Pain Orientation Right;Lower   ? Pain Type Chronic pain   ? Pain Onset More than a month ago   ? Pain Frequency Intermittent   ? Aggravating Factors  repeated heavy lifting at work   ? Pain Relieving Factors stretching, sitting, being still   ? ?  ?  ? ?   ? ? ? ? ? [facility] PT Assessment - 05/20/21 0001   ? ?  ? Assessment  ? Medical Diagnosis right hip pain   ? Referring Provider (PT) Dr Lauretta Grill Dorthy Cooler   ? Onset Date/Surgical Date --   2 months  ? Next MD Visit as needed   ? Prior Therapy 1997 for knee   ?  ? Prior Function  ? Level of Independence Independent   ? Vocation Full time employment   ? Vocation Requirements work at [employer]   ? Leisure used to bowl; walking; go to park   ?  ? Observation/Other Assessments  ? Focus on Therapeutic Outcomes (FOTO)  99%   ?  ? Posture/Postural Control  ? Postural Limitations Increased lumbar lordosis;Flexed trunk   ? Posture Comments able to correct habitual posture with cueing   ?  ? AROM  ? Right Hip Flexion 115   ? Right Hip External Rotation  50   ? Right Hip Internal Rotation  10   ? Left Hip Flexion 115   ? Left Hip External Rotation  60   ? Left Hip Internal Rotation  10   ? Lumbar Flexion 55   HS limits  ? Lumbar Extension 15   pain  ?  ? PROM  ? Overall PROM Comments Rt hip end range stiffness IR/ER compared to Lt   ?  ? Strength  ? Overall Strength Comments 5/5 bil LEs   ?  ? Flexibility  ? Hamstrings WNL   ? Quadriceps Rt limited 35%   ? ?  ?  ? ?  ? ? ? ? ? ? ? ? ? ? ? ? ? ? ? ? [facility] PT Treatment/Exercise - 05/20/21 0001   ? ?  ? Exercises  ? Exercises Shoulder;Lumbar;Knee/Hip   ?  ? Lumbar Exercises: Aerobic  ? Nustep L6 x 6' PT present to review goals   ?  ? Lumbar Exercises: Machines for Strengthening  ? Leg Press 2x10 100lb   ?  ? Lumbar Exercises: Sidelying  ? Other Sidelying Lumbar Exercises open books x 10 each   ?  ? Knee/Hip Exercises: Stretches  ? Active Hamstring Stretch Both;2 reps;20 seconds   ? Active Hamstring Stretch Limitations seated   ? Quad Stretch Both;2 reps;30 seconds   ? Quad Stretch Limitations prone with strap   ? Hip Flexor Stretch Both   ? Hip Flexor Stretch Limitations 10 reps foot on 2nd step arms overhead, pause in stretch and repeat   ?  ? Knee/Hip Exercises: Machines for  Strengthening  ? Hip Cybex 55lb 1x10 each hip abd, ext, flexion march   ?  ? Shoulder Exercises: Power Tower  ? Row 20 reps   ? Row Limitations 2x10 20lb   ? Other Power Location manager 10 reps 45lb   ? ?  ?  ? ?  ? ? ? ? ? ? ? ? ? ? ? ? PT Short Term Goals - 03/11/21 1333   ? ?  ? PT SHORT TERM GOAL #1  ? Title The patient will demonstrate basic self care strategies and initial HEP   ? Status Achieved   ?  ? PT SHORT TERM GOAL #2  ? Title The patient will report a 40% improvement in right lateral hip pain   ? Baseline 35-40%   ? Status Achieved   ?  ? PT SHORT TERM GOAL #3  ? Title Improved lumbar flexion to 60 degrees and hip flexion to 110 degrees for greater ease with lifting   ? Baseline hip flexion 115 bil, lumbar flexion 60 deg   ? Status Achieved   ?  ? PT SHORT TERM GOAL #4  ? Title Improved  HS length to 70 degrees and hip flexor length to 10 degrees for improved mobility with work tasks/lifting   ? Status Achieved   ?  ? PT SHORT TERM GOAL #5  ? Title Hip external rotation to 45 degrees on right needed for greater ease putting on socks/shoes   ? Status Achieved   ? ?  ?  ? ?  ? ? ? ? PT Long Term Goals - 05/20/21 1221   ? ?  ? PT LONG TERM GOAL #1  ? Title The patient will be independent with HEP and will return to lifting his home weights   ? Status Achieved   ?  ? PT LONG TERM GOAL #2  ? Title The patient will report a 70% improvement in right hip pain with home and work ADLs   ? Baseline 50%   ? Status Partially Met   ?  ?  PT LONG TERM GOAL #3  ? Title Improved bil hip and trunk strength to grossly 4+/5 to 5/5 needed for lifting heavier weights   ? Status Achieved   ?  ? PT LONG TERM GOAL #4  ? Title The patient will have improved lumbopelvic and hip mobility for greater ease ascending and descending his parents basement steps and possible return to bowling   ? Status Achieved   ?  ? PT LONG TERM GOAL #5  ? Title FOTO score improved from 49% to 71%   ? Baseline 99%   ? Status Achieved   ?   ? PT LONG TERM GOAL #6  ? Title Pt will demo proper body mechanics with bend/lift/transfer/carry loaded box up to 25lb x 10 reps and single transfer of loaded box x 50lb   ? Status Achieved   ?  ? PT LONG TERM GOAL #7  ? Title Pt will report no to minimal pain with up to 2-3 hours of work at [employer] demonstrating increased tolerance of bend/lift/carry/load packages.   ? Status Achieved   ? ?  ?  ? ?  ? ? ? ? ? ? ? ? Plan - 05/20/21 1150   ? ? Clinical Impression Statement Pt reports 50% improvement with PT. He demos proper form and technique with lifting 25-50lb box for work simulation without pain.  He feels the more he moves both on the job and outside of work the better he feels. Functional stregnth is much improved for push/pull/lift.  He has met or partially met all LTGs.  FOTO score improved to 99% today.  He will need to continue working on LE flexibility and focus on ongoing proper body mechanics for heavy lifting at work. Pt is ready to d/c to HEP.   ? Personal Factors and Comorbidities Profession   ? PT Frequency 1x / week   ? PT Duration 6 weeks   ? PT Treatment/Interventions ADLs/Self Care Home Management;Aquatic Therapy;Cryotherapy;Electrical Stimulation;Ultrasound;Moist Heat;Iontophoresis 72m/ml Dexamethasone;Traction;Therapeutic activities;Therapeutic exercise;Neuromuscular re-education;Manual techniques;Patient/family education;Dry needling;Taping;Spinal Manipulations;Functional mobility training   ? PT Next Visit Plan d/c to HEP and encouraged Pt to join a gym   ? PT Home Exercise Plan Access Code: [email or web address]  ? Consulted and Agree with Plan of Care Patient   ? ?  ?  ? ?  ? ? ?Patient will benefit from skilled therapeutic intervention in order to improve the following deficits and impairments:    ? ?Visit Diagnosis: ?Pain in right hip ? ?Stiffness of right hip, not elsewhere classified ? ?Muscle weakness (generalized) ? ?Stiffness of left hip, not elsewhere classified ? ? ? ? ?Problem List ?Patient  Active Problem List  ? Diagnosis Date Noted  ? Atypical chest pain 11/03/2016  ? Dyslipidemia 11/03/2016  ? Family history of peripheral arterial disease 11/03/2016  ? Smoker 11/03/2016  ? Family history of abdominal a

## 2021-05-27 ENCOUNTER — Encounter: Payer: Managed Care, Other (non HMO) | Admitting: Physical Therapy

## 2021-06-05 ENCOUNTER — Encounter: Payer: Managed Care, Other (non HMO) | Admitting: Psychology

## 2021-06-17 ENCOUNTER — Encounter: Payer: Managed Care, Other (non HMO) | Admitting: Psychology

## 2021-06-30 ENCOUNTER — Ambulatory Visit: Payer: Managed Care, Other (non HMO) | Admitting: Physician Assistant

## 2021-10-21 ENCOUNTER — Encounter: Payer: Self-pay | Admitting: Psychology

## 2021-12-25 ENCOUNTER — Ambulatory Visit (INDEPENDENT_AMBULATORY_CARE_PROVIDER_SITE_OTHER): Payer: Managed Care, Other (non HMO) | Admitting: Physician Assistant

## 2021-12-25 ENCOUNTER — Encounter: Payer: Self-pay | Admitting: Physician Assistant

## 2021-12-25 VITALS — BP 136/85 | HR 79 | Resp 20 | Ht 69.0 in | Wt 185.0 lb

## 2021-12-25 DIAGNOSIS — G3184 Mild cognitive impairment, so stated: Secondary | ICD-10-CM

## 2021-12-25 DIAGNOSIS — R251 Tremor, unspecified: Secondary | ICD-10-CM | POA: Diagnosis not present

## 2021-12-25 DIAGNOSIS — R413 Other amnesia: Secondary | ICD-10-CM | POA: Diagnosis not present

## 2021-12-25 NOTE — Patient Instructions (Addendum)
It was a pleasure to see you today at our office.   Recommendations:  DAT scan of the brain, the radiology office will call you to arrange you appointment Referral to behavioral therapy  Referral to neurocognitive testing  Follow up in 2  months  No smoking   RECOMMENDATIONS FOR ALL PATIENTS WITH MEMORY PROBLEMS: 1. Continue to exercise (Recommend 30 minutes of walking everyday, or 3 hours every week) 2. Increase social interactions - continue going to [place] and enjoy social gatherings with friends and family 3. Eat healthy, avoid fried foods and eat more fruits and vegetables 4. Maintain adequate blood pressure, blood sugar, and blood cholesterol level. Reducing the risk of stroke and cardiovascular disease also helps promoting better memory. 5. Avoid stressful situations. Live a simple life and avoid aggravations. Organize your time and prepare for the next day in anticipation. 6. Sleep well, avoid any interruptions of sleep and avoid any distractions in the bedroom that may interfere with adequate sleep quality 7. Avoid sugar, avoid sweets as there is a strong link between excessive sugar intake, diabetes, and cognitive impairment We discussed the Mediterranean diet, which has been shown to help patients reduce the risk of progressive memory disorders and reduces cardiovascular risk. This includes eating fish, eat fruits and green leafy vegetables, nuts like almonds and hazelnuts, walnuts, and also use olive oil. Avoid fast foods and fried foods as much as possible. Avoid sweets and sugar as sugar use has been linked to worsening of memory function.  There is always a concern of gradual progression of memory problems. If this is the case, then we may need to adjust level of care according to patient needs. Support, both to the patient and caregiver, should then be put into place.       FALL PRECAUTIONS: Be cautious when walking. Scan the area for obstacles that may increase the risk  of trips and falls. When getting up in the mornings, sit up at the edge of the bed for a few minutes before getting out of bed. Consider elevating the bed at the head end to avoid drop of blood pressure when getting up. Walk always in a well-lit room (use night lights in the walls). Avoid area rugs or power cords from appliances in the middle of the walkways. Use a walker or a cane if necessary and consider physical therapy for balance exercise. Get your eyesight checked regularly.  FINANCIAL OVERSIGHT: Supervision, especially oversight when making financial decisions or transactions is also recommended.  HOME SAFETY: Consider the safety of the kitchen when operating appliances like stoves, microwave oven, and blender. Consider having supervision and share cooking responsibilities until no longer able to participate in those. Accidents with firearms and other hazards in the house should be identified and addressed as well.   ABILITY TO BE LEFT ALONE: If patient is unable to contact 911 operator, consider using LifeLine, or when the need is there, arrange for someone to stay with patients. Smoking is a fire hazard, consider supervision or cessation. Risk of wandering should be assessed by caregiver and if detected at any point, supervision and safe proof recommendations should be instituted.  MEDICATION SUPERVISION: Inability to self-administer medication needs to be constantly addressed. Implement a mechanism to ensure safe administration of the medications.   DRIVING: Regarding driving, in patients with progressive memory problems, driving will be impaired. We advise to have someone else do the driving if trouble finding directions or if minor accidents are reported. Independent driving assessment  is available to determine safety of driving.   If you are interested in the driving assessment, you can contact the following:  [employer] in [place]  [place] [phone] or [phone]    Harmony refers to food and lifestyle choices that are based on the traditions of countries located on the [employer]. This way of eating has been shown to help prevent certain conditions and improve outcomes for people who have chronic diseases, like kidney disease and heart disease. What are tips for following this plan? Lifestyle  Cook and eat meals together with your family, when possible. Drink enough fluid to keep your urine clear or pale yellow. Be physically active every day. This includes: Aerobic exercise like running or swimming. Leisure activities like gardening, walking, or housework. Get 7-8 hours of sleep each night. If recommended by your health care provider, drink red wine in moderation. This means 1 glass a day for nonpregnant women and 2 glasses a day for men. A glass of wine equals 5 oz (150 mL). Reading food labels  Check the serving size of packaged foods. For foods such as rice and pasta, the serving size refers to the amount of cooked product, not dry. Check the total fat in packaged foods. Avoid foods that have saturated fat or trans fats. Check the ingredients list for added sugars, such as corn syrup. Shopping  At the grocery store, buy most of your food from the areas near the walls of the store. This includes: Fresh fruits and vegetables (produce). Grains, beans, nuts, and seeds. Some of these may be available in unpackaged forms or large amounts (in bulk). Fresh seafood. Poultry and eggs. Low-fat dairy products. Buy whole ingredients instead of prepackaged foods. Buy fresh fruits and vegetables in-season from local farmers markets. Buy frozen fruits and vegetables in resealable bags. If you do not have access to quality fresh seafood, buy precooked frozen shrimp or canned fish, such as tuna,  salmon, or sardines. Buy small amounts of raw or cooked vegetables, salads, or olives from the deli or salad bar at your store. Stock your pantry so you always have certain foods on hand, such as olive oil, canned tuna, canned tomatoes, rice, pasta, and beans. Cooking  Cook foods with extra-virgin olive oil instead of using butter or other vegetable oils. Have meat as a side dish, and have vegetables or grains as your main dish. This means having meat in small portions or adding small amounts of meat to foods like pasta or stew. Use beans or vegetables instead of meat in common dishes like chili or lasagna. Experiment with different cooking methods. Try roasting or broiling vegetables instead of steaming or sauteing them. Add frozen vegetables to soups, stews, pasta, or rice. Add nuts or seeds for added healthy fat at each meal. You can add these to yogurt, salads, or vegetable dishes. Marinate fish or vegetables using olive oil, lemon juice, garlic, and fresh herbs. Meal planning  Plan to eat 1 vegetarian meal one day each week. Try to work up to 2 vegetarian meals, if possible. Eat seafood 2 or more times a week. Have healthy snacks readily available, such as: Vegetable sticks with hummus. Greek yogurt. Fruit and nut trail mix. Eat balanced meals throughout the week. This includes: Fruit: 2-3 servings a day Vegetables: 4-5 servings a day Low-fat dairy: 2 servings a day Fish, poultry, or lean meat: 1  serving a day Beans and legumes: 2 or more servings a week Nuts and seeds: 1-2 servings a day Whole grains: 6-8 servings a day Extra-virgin olive oil: 3-4 servings a day Limit red meat and sweets to only a few servings a month What are my food choices? Mediterranean diet Recommended Grains: Whole-grain pasta. Brown rice. Bulgar wheat. Polenta. Couscous. Whole-wheat bread. Orpah Cobb. Vegetables: Artichokes. Beets. Broccoli. Cabbage. Carrots. Eggplant. Green beans. Chard. Kale.  Spinach. Onions. Leeks. Peas. Squash. Tomatoes. Peppers. Radishes. Fruits: Apples. Apricots. Avocado. Berries. Bananas. Cherries. Dates. Figs. Grapes. Lemons. Melon. Oranges. Peaches. Plums. Pomegranate. Meats and other protein foods: Beans. Almonds. Sunflower seeds. Pine nuts. Peanuts. Cod. Salmon. Scallops. Shrimp. Tuna. Tilapia. Clams. Oysters. Eggs. Dairy: Low-fat milk. Cheese. Greek yogurt. Beverages: Water. Red wine. Herbal tea. Fats and oils: Extra virgin olive oil. Avocado oil. Grape seed oil. Sweets and desserts: [place] yogurt with honey. Baked apples. Poached pears. Trail mix. Seasoning and other foods: Basil. Cilantro. Coriander. Cumin. Mint. Parsley. Sage. Rosemary. Tarragon. Garlic. Oregano. Thyme. Pepper. Balsalmic vinegar. Tahini. Hummus. Tomato sauce. Olives. Mushrooms. Limit these Grains: Prepackaged pasta or rice dishes. Prepackaged cereal with added sugar. Vegetables: Deep fried potatoes (french fries). Fruits: Fruit canned in syrup. Meats and other protein foods: Beef. Pork. Lamb. Poultry with skin. Hot dogs. Tomasa Blase. Dairy: Ice cream. Sour cream. Whole milk. Beverages: Juice. Sugar-sweetened soft drinks. Beer. Liquor and spirits. Fats and oils: Butter. Canola oil. Vegetable oil. Beef fat (tallow). Lard. Sweets and desserts: Cookies. Cakes. Pies. Candy. Seasoning and other foods: Mayonnaise. Premade sauces and marinades. The items listed may not be a complete list. Talk with your dietitian about what dietary choices are right for you. Summary The Mediterranean diet includes both food and lifestyle choices. Eat a variety of fresh fruits and vegetables, beans, nuts, seeds, and whole grains. Limit the amount of red meat and sweets that you eat. Talk with your health care provider about whether it is safe for you to drink red wine in moderation. This means 1 glass a day for nonpregnant women and 2 glasses a day for men. A glass of wine equals 5 oz (150 mL). This information is  not intended to replace advice given to you by your health care provider. Make sure you discuss any questions you have with your health care provider. Document Released: 10/17/2015 Document Revised: 11/19/2015 Document Reviewed: 10/17/2015 Elsevier Interactive Patient Education  2017 [employer].
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
## 2022-01-14 ENCOUNTER — Encounter (HOSPITAL_COMMUNITY)
Admission: RE | Admit: 2022-01-14 | Discharge: 2022-01-14 | Disposition: A | Discharge status: Home / Self Care | Payer: Managed Care, Other (non HMO) | Source: Ambulatory Visit | Attending: Physician Assistant | Admitting: Physician Assistant

## 2022-01-14 DIAGNOSIS — R251 Tremor, unspecified: Secondary | ICD-10-CM | POA: Diagnosis present

## 2022-01-14 MED ORDER — IOFLUPANE I 123 185 MBQ/2.5ML IV SOLN
4.2000 | Freq: Once | INTRAVENOUS | Status: AC | PRN
  Administered 2022-01-14: 4.2 via INTRAVENOUS

## 2022-01-14 MED ORDER — POTASSIUM IODIDE (ANTIDOTE) 130 MG PO TABS
ORAL_TABLET | ORAL | Status: AC
  Filled 2022-01-14: qty 1

## 2022-01-21 NOTE — Progress Notes (Signed)
The DAT scan is suspicious for Parkinson's syndrome, but will discuss these when I see on my next appointment, and if there is indication to refer to our movement disorders doctor in our practice, will arrange that then.  Thank you

## 2022-02-12 ENCOUNTER — Encounter: Payer: Managed Care, Other (non HMO) | Admitting: Psychology

## 2022-02-23 ENCOUNTER — Encounter: Payer: Managed Care, Other (non HMO) | Admitting: Psychology

## 2022-02-23 ENCOUNTER — Encounter: Payer: Self-pay | Admitting: Psychology

## 2022-02-23 ENCOUNTER — Ambulatory Visit: Payer: Managed Care, Other (non HMO) | Admitting: Psychology

## 2022-02-23 ENCOUNTER — Ambulatory Visit (INDEPENDENT_AMBULATORY_CARE_PROVIDER_SITE_OTHER): Payer: Managed Care, Other (non HMO) | Admitting: Psychology

## 2022-02-23 DIAGNOSIS — G3184 Mild cognitive impairment, so stated: Secondary | ICD-10-CM | POA: Diagnosis not present

## 2022-02-23 DIAGNOSIS — G20C Parkinsonism, unspecified: Secondary | ICD-10-CM | POA: Insufficient documentation

## 2022-02-23 DIAGNOSIS — R251 Tremor, unspecified: Secondary | ICD-10-CM | POA: Diagnosis not present

## 2022-02-23 DIAGNOSIS — R4189 Other symptoms and signs involving cognitive functions and awareness: Secondary | ICD-10-CM

## 2022-02-23 DIAGNOSIS — K635 Polyp of colon: Secondary | ICD-10-CM | POA: Insufficient documentation

## 2022-02-23 DIAGNOSIS — G4752 REM sleep behavior disorder: Secondary | ICD-10-CM | POA: Diagnosis not present

## 2022-02-23 DIAGNOSIS — I10 Essential (primary) hypertension: Secondary | ICD-10-CM | POA: Insufficient documentation

## 2022-02-23 HISTORY — DX: Mild cognitive impairment of uncertain or unknown etiology: G31.84

## 2022-02-23 NOTE — Progress Notes (Unsigned)
NEUROPSYCHOLOGICAL EVALUATION [place]. [facility] Department of Neurology  Date of Evaluation: February 23, 2022  Reason for Referral:   Tyler Dickerson is a 56 y.o. right-handed African-American male referred by Marlowe Kays, PA-C, to characterize his current cognitive functioning and assist with diagnostic clarity and treatment planning in the context of subjective cognitive decline, abnormal DaTscan, and the presence of several parkinsonisms.   Assessment and Plan:   Clinical Impression(s): Tyler Dickerson' pattern of performance is suggestive of ***  Tyler Dickerson denied difficulties completing instrumental activities of daily living (ADLs) independently. As such, given evidence for cognitive dysfunction described above, he meets criteria for a Mild Neurocognitive Disorder ("mild cognitive impairment") at the present time.  Tyler Dickerson and *** reported difficulties completing instrumental activities of daily living (ADLs) surrounding ***. This, coupled with evidence for significant cognitive dysfunction described above, suggests that he meets criteria for a Major Neurocognitive Disorder ("dementia") at the present time.  Regarding etiology, there have yet to be any longitudinal studies to elucidate the likelihood and extent of persisting cognitive dysfunction stemming from prior COVID-19 infection. Current studies have theorized many different potential contributing factors and it remains unclear if deficits are due to direct CNS involvement, related to external factors (e.g., fatigue, sleep disruption, and psychiatric distress), or related to a combination of several factors. ***  Factors which can create and maintain cognitive inefficiencies include a positive concussion history, symptoms of ADHD, and significant psychiatric distress (e.g., anxiety and depression). It is likely that a combination of these factors are responsible for day-to-day cognitive difficulties  which Tyler Dickerson has been experiencing presently, with perhaps the primary culprit being ***.  Specific to ADHD, the absence of cognitive deficits should not be interpreted as absence of this condition as there is no pattern of performance across cognitive testing that is specific to ADHD. Individuals with ADHD can perform strongly in testing environments, likely due to the highly structured and distraction free setting in which testing commences.   Specific to memory, Tyler Dickerson was able to learn novel verbal and visual information efficiently and retain this knowledge after lengthy delays. Overall, memory performance combined with intact performances across other areas of cognitive functioning is not suggestive of Alzheimer's disease. Likewise, his cognitive and behavioral profile is not suggestive of any other form of neurodegenerative illness presently.  Recommendations: ***  A repeat neuropsychological evaluation in 12-18 months (or sooner if functional decline is noted) is recommended to assess the trajectory of future cognitive decline should it occur. This will also aid in future efforts towards improved diagnostic clarity.  Tyler Dickerson. [place] has already been prescribed a medication aimed to address memory loss and concerns surrounding Alzheimer's disease (i.e., ***). he is encouraged to continue taking this medication as prescribed. It is important to highlight that this medication has been shown to slow functional decline in some individuals. There is no current treatment which can stop or reverse cognitive decline when caused by a neurodegenerative illness.   A combination of medication and psychotherapy has been shown to be most effective at treating symptoms of anxiety and depression. As such, Tyler Dickerson is encouraged to speak with his prescribing physician regarding medication adjustments to optimally manage these symptoms. Likewise, Tyler Dickerson is encouraged to consider engaging in  short-term psychotherapy to address symptoms of psychiatric distress. he would benefit from an active and collaborative therapeutic environment, rather than one purely supportive in nature. Recommended treatment modalities include Cognitive Behavioral Therapy (CBT) or Acceptance and  Commitment Therapy (ACT).  Performance across neurocognitive testing is not a strong predictor of an individual's safety operating a motor vehicle. Should his family wish to pursue a formalized driving evaluation, they could reach out to the following agencies: [employer] [place]: [phone] [facility]: [phone] [facility]: [phone] [facility]: [phone] or [phone]  {ZCMDementiaRecs:22765}  Tyler Dickerson is encouraged to attend to lifestyle factors for brain health (e.g., regular physical exercise, good nutrition habits, regular participation in cognitively-stimulating activities, and general stress management techniques), which are likely to have benefits for both emotional adjustment and cognition. In fact, in addition to promoting good general health, regular exercise incorporating aerobic activities (e.g., brisk walking, jogging, cycling, etc.) has been demonstrated to be a very effective treatment for depression and stress, with similar efficacy rates to both antidepressant medication and psychotherapy.  Optimal control of vascular risk factors (including safe cardiovascular exercise and adherence to dietary recommendations) is encouraged. Likewise, continued compliance with his CPAP machine will also be important. ***  Continued participation in activities which provide mental stimulation and social interaction is also recommended.   If interested, there are some activities which have therapeutic value and can be useful in keeping him cognitively stimulated. For suggestions, Tyler Dickerson is encouraged to go to the following website:  https://www.[facility].org/get-to-know-barrow/centers-programs/neurorehabilitation-center/neuro-rehab-apps-and-games/ which has options, categorized by level of difficulty. It should be noted that these activities should not be viewed as a substitute for therapy.  Important information should be provided to Tyler Dickerson in written format in all instances. This information should be placed in a highly frequented and easily visible location within his home to promote recall. External strategies such as written notes in a consistently used memory journal, visual and nonverbal auditory cues such as a calendar on the refrigerator or appointments with alarm, such as on a cell phone, can also help maximize recall.  {ZCMMemoryRecs:22760}  To address problems with processing speed, he may wish to consider: {ZCMProcessingSpeedRecs:22762}  To address problems with fluctuating attention, he may wish to consider: {ZCMAttentionRecs:22761}  To address problems with working memory, he may wish to consider: {ZCMAttentionRecs:22761}  {ZCMLanguageRecs:22763}  Review of Records:   Tyler Dickerson [facility] Marlowe Kays(Sara Wertman, PA-C) on 12/25/2020 for an evaluation of memory loss. At that time, memory loss was said to be present for the past year or so. His wife described noticing "subtle things" such as forgetting names or repeating the same questions or sentences several times. There have also been REM sleep behaviors, including hitting and kicking behaviors. Hallucinations or ongoing paranoia was denied. ADLs were described as intact. Performance on a brief cognitive screening instrument (MOCA) was 21/30.   He was seen by Ms. Wertman for follow-up on 12/25/2021. In the interim, various parkinsonian symptoms were reported, including bilateral upper extremity tremors, bradykinesia, diminished arm swing, and some hypophonia. Concern was expressed for Lewy body disease. Performance on a brief cognitive  screening instrument (MMSE) was 29/30. Ultimately, Tyler Dickerson was referred for a comprehensive neuropsychological evaluation to characterize his cognitive abilities and to assist with diagnostic clarity and treatment planning.   Head CT on 12/31/2020 in the context of ongoing headaches was negative. Brain MRI on 01/08/2021 was unremarkable. DaTscan on 01/19/2022 revealed significantly decreased radiotracer activity within the bilateral striata.   Past Medical History:  Diagnosis Date   Atypical chest pain 11/03/2016   Dyslipidemia 11/03/2016   Family history of abdominal aortic aneurysm (AAA) 11/03/2016   Family history of peripheral arterial disease  11/03/2016   History of COVID-19 10/2020   Hypercholesteremia    Mild   Hyperplastic colon polyp    Hypertension    Parkinsonism    REM sleep behavior disorder    Tremor     Past Surgical History:  Procedure Laterality Date   COLONOSCOPY  2015   REPLACEMENT TOTAL KNEE Left 1996    Current Outpatient Medications:    amLODipine (NORVASC) 5 MG tablet, Take 5 mg by mouth daily., Disp: , Rfl:    aspirin 81 MG chewable tablet, Chew by mouth daily., Disp: , Rfl:    atorvastatin (LIPITOR) 20 MG tablet, Take 1 tablet by mouth daily., Disp: , Rfl:    cyclobenzaprine (FLEXERIL) 10 MG tablet, Take 1 tablet (10 mg total) by mouth 2 (two) times daily as needed for muscle spasms. (Patient not taking: Reported on 12/25/2021), Disp: 20 tablet, Rfl: 0   ibuprofen (ADVIL,MOTRIN) 600 MG tablet, Take 1 tablet (600 mg total) by mouth every 8 (eight) hours as needed for pain. Take with meals, Disp: 30 tablet, Rfl: 0   naproxen (NAPROSYN) 500 MG tablet, Take 1 tablet (500 mg total) by mouth 2 (two) times daily with a meal. (Patient not taking: Reported on 12/25/2021), Disp: 30 tablet, Rfl: 0   sertraline (ZOLOFT) 50 MG tablet, Take 50 mg by mouth daily., Disp: , Rfl:    traMADol (ULTRAM) 50 MG tablet, Take 1 tablet (50 mg total) by mouth every 8 (eight) hours  as needed for pain. (Patient not taking: Reported on 12/25/2021), Disp: 20 tablet, Rfl: 0  Clinical Interview:   The following information was obtained during a clinical interview with Tyler Dickerson and his wife prior to cognitive testing.  Cognitive Symptoms: Decreased short-term memory: Endorsed. He described generalized forgetfulness, especially surrounding recent conversations and names of familiar individuals. He reported a gradual decline of memory capabilities during the past 2-3 years. His wife was in agreement with this timeline of progressive decline. She also added that he may ask repetitive questions or make repetitive statements in conversation.  Decreased long-term memory: Denied. Decreased attention/concentration: Endorsed. He described trouble with increased distractibility and losing his train of thought. His wife was generally in agreement.  Reduced processing speed: Endorsed. This represented one of his more salient areas of concern.  Difficulties with executive functions: Denied. Both he and his wife denied trouble with impulsivity or any significant personality changes.  Difficulties with emotion regulation: Denied. Difficulties with receptive language: Denied. However, his wife noted commonly having concerns that Tyler Dickerson is unable to comprehend what she and others are saying to him. He seems to have trouble fully understand "what is going on" at certain times.  Difficulties with word finding: Endorsed. Decreased visuoperceptual ability: Denied.  Difficulties completing ADLs: Denied. He manages medications and finances independently without significant difficulty. He described a rare instance where a bill might have been paid slightly late. He continues to drive without reported difficulty.   Additional Medical History: History of traumatic brain injury/concussion: He described a very remote potential head injury stemming from a fall. He denied a loss in consciousness or  persisting symptoms outside of some dizziness at that time. No recent head injuries were described.  History of stroke: Denied. History of seizure activity: Denied. History of known exposure to toxins: Denied. Symptoms of chronic pain: Endorsed. He reported diffuse pain attributed to arthritis.  Experience of frequent headaches/migraines: Denied. Frequent instances of dizziness/vertigo: Denied.  Sensory changes: He wears glasses with benefit. Other sensory changes/difficulties (  e.g., hearing, taste, smell) were denied. Balance/coordination difficulties: Denied. He described his balance as "good" and denied any recent falls. He did acknowledge some gait changes surrounding smaller steps and slowed movement overall.  Other motor difficulties: Endorsed. He reported generally mild tremors impacting his upper extremities bilaterally. One side was not said to be worse than the other. Tremors were said to become noticeable about 2-3 years ago, coinciding with onset of cognitive dysfunction. His wife noted that symptoms do appear more pronounced when stressed.   Sleep History: Estimated hours obtained each night: 8+ hours.  Difficulties falling asleep: Denied. Difficulties staying asleep: Denied. Feels rested and refreshed upon awakening: Endorsed.  History of snoring: Endorsed. History of waking up gasping for air: Denied. Witnessed breath cessation while asleep: Denied.  History of vivid dreaming: Endorsed. Excessive movement while asleep: Endorsed. Instances of acting out his dreams: Endorsed. His wife described notable hitting, kicking, and other behaviors consistent with him acting out dream content. Symptoms were said to be present for the past year or so and include instances where he has fallen out of bed. His wife added that he will sometimes wake up and immediately ask her confusing and somewhat obscure questions (e.g., where is the lid for the dishwasher  detergent?").  Psychiatric/Behavioral Health History: Depression: Endorsed. He reported the onset of depressive symptoms during the past 2-3 years. His wife wondered if these actually started just before concerns were noted surrounding cognitive decline motor difficulties. Current medication intervention for mood concerns were said to be helpful. He meets with his psychiatrist for management and denied a history of working with an individual therapist. Current or remote suicidal ideation, intent, or plan was denied.  Anxiety: Denied. Mania: Denied. Trauma History: Denied. Visual/auditory hallucinations: Denied. He did describe having a perception or feeling like somebody is standing or following behind him. However, he denied ever actually seeing a fully-formed individual or animal in his environment.  Delusional thoughts: Denied.  Tobacco: Denied. Alcohol: He reported consuming like 4-6 beers throughout the weekend. He denied a history of problematic alcohol abuse or dependence.  Recreational drugs: Denied.  Family History: Problem Relation Age of Onset   Aneurysm Father    Supraventricular tachycardia Brother    This information was confirmed by Tyler Dickerson.  Academic/Vocational History: Highest level of educational attainment: 14 years. He graduated from high school and completed two additional years of college. He did not report earning an Associate's degree. He described himself as an average (B/C) student in academic settings. No relative weaknesses were identified.  History of developmental delay: Denied. History of grade repetition: Denied. Enrollment in special education courses: Denied. History of LD/ADHD: Denied.  Employment: He currently works part-time for [employer] as a Administrator, sports. He reported some trouble performing job responsibilities, largely due to slowed cognitive and slowed physical movement.   Evaluation Results:   Behavioral Observations: Tyler Dickerson.  Dickerson was accompanied by his wife, arrived to his appointment on time, and was appropriately dressed and groomed. He appeared alert. He ambulated slowly but did not exhibit frank balance instability. Very subtle tremors were observed in his hands bilaterally. His affect appeared fairly flat and he was very soft-spoken. It was unclear how much of this represented his baseline versus concerns for hypophonia. Spontaneous speech was fluent outside of very mild stuttering at times. The latter was attributed to some anxiety during the interview. No prominent word finding difficulties were observed during the interview. Thought processes were coherent, organized, and normal in content.  Insight into his cognitive difficulties appeared ***.   During testing, sustained attention was appropriate. Task engagement was adequate and he persisted when challenged. Overall, Tyler Dickerson was cooperative with the clinical interview and subsequent testing procedures.   Adequacy of Effort: The validity of neuropsychological testing is limited by the extent to which the individual being tested may be assumed to have exerted adequate effort during testing. Tyler Dickerson expressed his intention to perform to the best of his abilities and exhibited adequate task engagement and persistence. Scores across stand-alone and embedded performance validity measures were within expectation. As such, the results of the current evaluation are believed to be a valid representation of Tyler Dickerson' current cognitive functioning.  Test Results: Tyler Dickerson was *** oriented at the time of the current evaluation. Points were lost for ***.  Intellectual abilities based upon educational and vocational attainment were estimated to be in the average range. Premorbid abilities were estimated to be within the {ZCMNormDescription:22759} range based upon a single-word reading test.   Processing speed was ***. Basic attention was ***. More complex  attention (e.g., working memory) was ***. Executive functioning was ***.  Assessed receptive language abilities were ***. Likewise, Tyler Dickerson did not exhibit any difficulties comprehending task instructions and answered all questions asked of him appropriately. Assessed expressive language (e.g., verbal fluency and confrontation naming) was ***.     Assessed visuospatial/visuoconstructional abilities were ***.    Learning (i.e., encoding) of novel verbal and visual information was ***. Spontaneous delayed recall (i.e., retrieval) of previously learned information was ***. Retention rates were ***% across a story learning task, ***% across a list learning task, and ***% across a shape learning task. Performance across recognition tasks was ***, suggesting *** evidence for information consolidation.   Results of emotional screening instruments suggested that recent symptoms of generalized anxiety were in the *** range, while symptoms of depression were within the *** range. A screening instrument assessing recent sleep quality suggested the presence of *** sleep dysfunction.  Tables of Scores:     Informed Consent and Coding/Compliance:   The current evaluation represents a clinical evaluation for the purposes previously outlined by the referral source and is in no way reflective of a forensic evaluation.   Tyler Dickerson. Roethler was provided with a verbal description of the nature and purpose of the present neuropsychological evaluation. Also reviewed were the foreseeable risks and/or discomforts and benefits of the procedure, limits of confidentiality, and mandatory reporting requirements of this provider. The patient was given the opportunity to ask questions and receive answers about the evaluation. Oral consent to participate was provided by the patient.   This evaluation was conducted by Newman Nickels, Ph.D., ABPP-CN, board certified clinical neuropsychologist. Tyler Dickerson. Schnabel completed a clinical  interview with Dr. Milbert Coulter, billed as one unit [phone], and 145 minutes of cognitive testing and scoring, billed as one unit [phone] and four additional units [id number]. Psychometrist Wallace Keller, B.S., assisted Dr. Milbert Coulter with test administration and scoring procedures. As a separate and discrete service, Dr. Milbert Coulter spent a total of 160 minutes in interpretation and report writing billed as one unit [phone] and two units 96133.

## 2022-02-23 NOTE — Progress Notes (Signed)
   Psychometrician Note   Cognitive testing was administered to [employer] by Wallace Keller, B.S. (psychometrist) under the supervision of Dr. Newman Nickels, Ph.D., licensed psychologist on 02/23/2022. Mr. Holtmeyer did not appear overtly distressed by the testing session per behavioral observation or responses across self-report questionnaires. Rest breaks were offered.    The battery of tests administered was selected by Dr. Newman Nickels, Ph.D. with consideration to Mr. Cardarelli current level of functioning, the nature of his symptoms, emotional and behavioral responses during interview, level of literacy, observed level of motivation/effort, and the nature of the referral question. This battery was communicated to the psychometrist. Communication between Dr. Newman Nickels, Ph.D. and the psychometrist was ongoing throughout the evaluation and Dr. Newman Nickels, Ph.D. was immediately accessible at all times. Dr. Newman Nickels, Ph.D. provided supervision to the psychometrist on the date of this service to the extent necessary to assure the quality of all services provided.    Laquan A Macdougal will return within approximately 1-2 weeks for an interactive feedback session with Dr. Milbert Coulter at which time his test performances, clinical impressions, and treatment recommendations will be reviewed in detail. Mr. Duchemin understands he can contact our office should he require our assistance before this time.  A total of 145 minutes of billable time were spent face-to-face with Mr. Sliney by the psychometrist. This includes both test administration and scoring time. Billing for these services is reflected in the clinical report generated by Dr. Newman Nickels, Ph.D.  This note reflects time spent with the psychometrician and does not include test scores or any clinical interpretations made by Dr. Milbert Coulter. The full report will follow in a separate note.

## 2022-02-24 ENCOUNTER — Encounter: Payer: Self-pay | Admitting: Physician Assistant

## 2022-02-24 ENCOUNTER — Encounter: Payer: Self-pay | Admitting: Psychology

## 2022-02-24 ENCOUNTER — Ambulatory Visit (INDEPENDENT_AMBULATORY_CARE_PROVIDER_SITE_OTHER): Payer: Managed Care, Other (non HMO) | Admitting: Physician Assistant

## 2022-02-24 VITALS — BP 129/86 | HR 60 | Resp 18 | Ht 69.0 in | Wt 191.0 lb

## 2022-02-24 DIAGNOSIS — G3184 Mild cognitive impairment, so stated: Secondary | ICD-10-CM | POA: Diagnosis not present
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
## 2022-02-24 NOTE — Patient Instructions (Signed)
It was a pleasure to see you today at our office.   Recommendations:  Keep appt with Psychiatry  Follow up in 2  months   Appt with Dr Tat   RECOMMENDATIONS FOR ALL PATIENTS WITH MEMORY PROBLEMS: 1. Continue to exercise (Recommend 30 minutes of walking everyday, or 3 hours every week) 2. Increase social interactions - continue going to [place] and enjoy social gatherings with friends and family 3. Eat healthy, avoid fried foods and eat more fruits and vegetables 4. Maintain adequate blood pressure, blood sugar, and blood cholesterol level. Reducing the risk of stroke and cardiovascular disease also helps promoting better memory. 5. Avoid stressful situations. Live a simple life and avoid aggravations. Organize your time and prepare for the next day in anticipation. 6. Sleep well, avoid any interruptions of sleep and avoid any distractions in the bedroom that may interfere with adequate sleep quality 7. Avoid sugar, avoid sweets as there is a strong link between excessive sugar intake, diabetes, and cognitive impairment We discussed the Mediterranean diet, which has been shown to help patients reduce the risk of progressive memory disorders and reduces cardiovascular risk. This includes eating fish, eat fruits and green leafy vegetables, nuts like almonds and hazelnuts, walnuts, and also use olive oil. Avoid fast foods and fried foods as much as possible. Avoid sweets and sugar as sugar use has been linked to worsening of memory function.  There is always a concern of gradual progression of memory problems. If this is the case, then we may need to adjust level of care according to patient needs. Support, both to the patient and caregiver, should then be put into place.       FALL PRECAUTIONS: Be cautious when walking. Scan the area for obstacles that may increase the risk of trips and falls. When getting up in the mornings, sit up at the edge of the bed for a few minutes before getting out  of bed. Consider elevating the bed at the head end to avoid drop of blood pressure when getting up. Walk always in a well-lit room (use night lights in the walls). Avoid area rugs or power cords from appliances in the middle of the walkways. Use a walker or a cane if necessary and consider physical therapy for balance exercise. Get your eyesight checked regularly.  FINANCIAL OVERSIGHT: Supervision, especially oversight when making financial decisions or transactions is also recommended.  HOME SAFETY: Consider the safety of the kitchen when operating appliances like stoves, microwave oven, and blender. Consider having supervision and share cooking responsibilities until no longer able to participate in those. Accidents with firearms and other hazards in the house should be identified and addressed as well.   ABILITY TO BE LEFT ALONE: If patient is unable to contact 911 operator, consider using LifeLine, or when the need is there, arrange for someone to stay with patients. Smoking is a fire hazard, consider supervision or cessation. Risk of wandering should be assessed by caregiver and if detected at any point, supervision and safe proof recommendations should be instituted.  MEDICATION SUPERVISION: Inability to self-administer medication needs to be constantly addressed. Implement a mechanism to ensure safe administration of the medications.   DRIVING: Regarding driving, in patients with progressive memory problems, driving will be impaired. We advise to have someone else do the driving if trouble finding directions or if minor accidents are reported. Independent driving assessment is available to determine safety of driving.   If you are interested in the driving assessment,  you can contact the following:  [facility] in [place] [phone]  [facility] [phone]  [facility] [phone] or  [phone]    Mediterranean Diet A Mediterranean diet refers to food and lifestyle choices that are based on the traditions of countries located on the [employer]. This way of eating has been shown to help prevent certain conditions and improve outcomes for people who have chronic diseases, like kidney disease and heart disease. What are tips for following this plan? Lifestyle  Cook and eat meals together with your family, when possible. Drink enough fluid to keep your urine clear or pale yellow. Be physically active every day. This includes: Aerobic exercise like running or swimming. Leisure activities like gardening, walking, or housework. Get 7-8 hours of sleep each night. If recommended by your health care provider, drink red wine in moderation. This means 1 glass a day for nonpregnant women and 2 glasses a day for men. A glass of wine equals 5 oz (150 mL). Reading food labels  Check the serving size of packaged foods. For foods such as rice and pasta, the serving size refers to the amount of cooked product, not dry. Check the total fat in packaged foods. Avoid foods that have saturated fat or trans fats. Check the ingredients list for added sugars, such as corn syrup. Shopping  At the grocery store, buy most of your food from the areas near the walls of the store. This includes: Fresh fruits and vegetables (produce). Grains, beans, nuts, and seeds. Some of these may be available in unpackaged forms or large amounts (in bulk). Fresh seafood. Poultry and eggs. Low-fat dairy products. Buy whole ingredients instead of prepackaged foods. Buy fresh fruits and vegetables in-season from local farmers markets. Buy frozen fruits and vegetables in resealable bags. If you do not have access to quality fresh seafood, buy precooked frozen shrimp or canned fish, such as tuna, salmon, or sardines. Buy small amounts of raw or cooked vegetables, salads, or olives from the deli or salad bar  at your store. Stock your pantry so you always have certain foods on hand, such as olive oil, canned tuna, canned tomatoes, rice, pasta, and beans. Cooking  Cook foods with extra-virgin olive oil instead of using butter or other vegetable oils. Have meat as a side dish, and have vegetables or grains as your main dish. This means having meat in small portions or adding small amounts of meat to foods like pasta or stew. Use beans or vegetables instead of meat in common dishes like chili or lasagna. Experiment with different cooking methods. Try roasting or broiling vegetables instead of steaming or sauteing them. Add frozen vegetables to soups, stews, pasta, or rice. Add nuts or seeds for added healthy fat at each meal. You can add these to yogurt, salads, or vegetable dishes. Marinate fish or vegetables using olive oil, lemon juice, garlic, and fresh herbs. Meal planning  Plan to eat 1 vegetarian meal one day each week. Try to work up to 2 vegetarian meals, if possible. Eat seafood 2 or more times a week. Have healthy snacks readily available, such as: Vegetable sticks with hummus. Greek yogurt. Fruit and nut trail mix. Eat balanced meals throughout the week. This includes: Fruit: 2-3 servings a day Vegetables: 4-5 servings a day Low-fat dairy: 2 servings a day Fish, poultry, or lean meat: 1 serving a day Beans and legumes: 2 or more servings a week Nuts and seeds: 1-2 servings  a day Whole grains: 6-8 servings a day Extra-virgin olive oil: 3-4 servings a day Limit red meat and sweets to only a few servings a month What are my food choices? Mediterranean diet Recommended Grains: Whole-grain pasta. Brown rice. Bulgar wheat. Polenta. Couscous. Whole-wheat bread. [place]. Vegetables: Artichokes. Beets. Broccoli. Cabbage. Carrots. Eggplant. Green beans. Chard. Kale. Spinach. Onions. Leeks. Peas. Squash. Tomatoes. Peppers. Radishes. Fruits: Apples. Apricots. Avocado. Berries.  Bananas. Cherries. Dates. Figs. Grapes. Lemons. Melon. Oranges. Peaches. Plums. Pomegranate. Meats and other protein foods: Beans. Almonds. Sunflower seeds. Pine nuts. Peanuts. [place]. Salmon. Scallops. Shrimp. [place]. Tilapia. Clams. Oysters. Eggs. Dairy: Low-fat milk. Cheese. Greek yogurt. Beverages: Water. Red wine. Herbal tea. Fats and oils: Extra virgin olive oil. Avocado oil. Grape seed oil. Sweets and desserts: [place] yogurt with honey. Baked apples. Poached pears. Trail mix. Seasoning and other foods: Basil. Cilantro. Coriander. Cumin. Mint. Parsley. Sage. Rosemary. Tarragon. Garlic. Oregano. Thyme. Pepper. Balsalmic vinegar. Tahini. Hummus. Tomato sauce. Olives. Mushrooms. Limit these Grains: Prepackaged pasta or rice dishes. Prepackaged cereal with added sugar. Vegetables: Deep fried potatoes (french fries). Fruits: Fruit canned in syrup. Meats and other protein foods: Beef. Pork. Lamb. Poultry with skin. Hot dogs. [place] Salines. Dairy: Ice cream. Sour cream. Whole milk. Beverages: Juice. Sugar-sweetened soft drinks. Beer. Liquor and spirits. Fats and oils: Butter. Canola oil. Vegetable oil. Beef fat (tallow). Lard. Sweets and desserts: Cookies. Cakes. Pies. Candy. Seasoning and other foods: Mayonnaise. Premade sauces and marinades. The items listed may not be a complete list. Talk with your dietitian about what dietary choices are right for you. Summary The Mediterranean diet includes both food and lifestyle choices. Eat a variety of fresh fruits and vegetables, beans, nuts, seeds, and whole grains. Limit the amount of red meat and sweets that you eat. Talk with your health care provider about whether it is safe for you to drink red wine in moderation. This means 1 glass a day for nonpregnant women and 2 glasses a day for men. A glass of wine equals 5 oz (150 mL). This information is not intended to replace advice given to you by your health care provider. Make sure you discuss any questions you  have with your health care provider. Document Released: 10/17/2015 Document Revised: 11/19/2015 Document Reviewed: 10/17/2015 Elsevier Interactive Patient Education  2017 [employer].

## 2022-02-25 NOTE — Progress Notes (Signed)
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  Memory loss at onset no longer precludes the diagnosis.  Dr. Milbert Coulter wonders if LBD is the diagnosis, and we cannot do that either, but he did not really give me red flags on that.  -We discussed the diagnosis as well as pathophysiology of the disease.  We discussed treatment options as well as prognostic indicators.  Patient education was provided.  -We discussed that it used to be thought that levodopa would increase risk of melanoma but now it is believed that Parkinsons itself likely increases risk of melanoma. he is to get regular skin checks.  -We decided to add carbidopa/levodopa 25/100.  1/2 tab tid x 1 wk, then 1/2 in am & noon & 1 at night for a week, then 1/2 in am &1 at noon &night for a week, then 1 po tid.  Risks, benefits, side effects and alternative therapies were discussed.  The opportunity to ask questions was given and they were answered to the best of my ability.  The patient expressed understanding and willingness to follow the outlined treatment protocols.  -I will refer the patient to the [facility]'s program [facility], for PT  -We discussed community resources in the area including patient support groups and community exercise programs for PD and pt education was provided to the patient.  -Patient's DaTscan was done on sertraline.  Generally, sertraline is held 3 days prior to DaTscan being performed.  Sertraline can bind to the dopamine transporter and cause false positives.  -generally private insurance will not pay for skin bx for alpha synuclein.  Did discuss this today.   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  We discussed that this can be very serious and even harmful.  We talked about medications as well as physical barriers to put in the bed (particularly soft bed rails, pillow barriers).  We talked  about moving the night stand so that it is not so close to the side of the bed.  -may be made worse by sertraline  Depression  -sees Dr. 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  Some concern for lbd.   Discussed case with dr. Milbert Coulter   Subjective:   Tyler Dickerson was seen today in the movement disorders clinic for consultation..  The consultation is for the evaluation of parkinsonism.  Pt with wife who supplements hx.  Patient has been seeing Eudelia Bunch since October, 2022.  Notes are reviewed.  Patient originally referred to our office for "memory loss and bradykinesia."  When he first saw Huntley Dec, she noted that mood change was a primary issue, with the patient having become short tempered and somewhat depressed.  He was referred for neurocognitive testing with Dr. Milbert Coulter, which did not get completed until December, 2023.  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  Patient saw Huntley Dec back on November 8.  There is near absent radiotracer in the left stratum and decreased radiotracer in the right stratum.  Pt states to me today that his first sx was moving slow.  Wife states that 2 years ago he had covid and he never really bounced back.  He started to have a "blank stare."  She noted if she raised his voice so he would listen,  he would "shake."  Then she noted he would have "itty bitty bites."  She also noted trouble reading and comprehending.  She noted trouble with being forward flexed.  Over the last few months, she noted short shuffling steps.  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 Voice: weaker Sleep: sleeps a lot per wife  Vivid Dreams:  Yes.    Acting out dreams:  Yes.   -  last fell OOB 3-4 months ago Wet Pillows: Yes.   Postural symptoms:  Yes.    Falls?  No. Bradykinesia symptoms: shuffling gait, slow movements, slowed thought processes, and difficulty regaining balance Loss of smell:  No. Loss of taste:  No. Urinary Incontinence:  No. Difficulty Swallowing:  No. Handwriting, micrographia: Yes.   Trouble with ADL's:  No.  Trouble buttoning clothing: No. Depression:  Yes.   (Even before this) Memory changes:  Yes.   Hallucinations:  No.  visual distortions: rare N/V:  No. Lightheaded:  No.  Syncope: No. Diplopia:  No. Dyskinesia:  No. Prior exposure to reglan/antipsychotics: No.  MRI brain was completed January 08, 2021.  This was done with and without gadolinium.  I personally reviewed it.  It was unremarkable.  PREVIOUS MEDICATIONS: none to date  ALLERGIES:  No Known Allergies  CURRENT MEDICATIONS:  Current Meds  Medication Sig   atorvastatin (LIPITOR) 20 MG tablet Take 1 tablet by mouth daily.   sertraline (ZOLOFT) 50 MG tablet Take 50 mg by mouth daily.     Objective:   VITALS:   Vitals:   02/27/22 0948  BP: (!) 145/89  Pulse: 79  SpO2: 98%  Weight: 189 lb 9.6 oz (86 kg)  Height: 5\' 9"  (1.753 m)    GEN:  The patient appears stated age and is in NAD. HEENT:  Normocephalic, atraumatic.  The mucous membranes are moist. The superficial temporal arteries are without ropiness or tenderness. CV:  RRR Lungs:  CTAB Neck/HEME:  There are no carotid bruits bilaterally.  Neurological examination:  Orientation: The patient is alert and oriented x3.  Cranial nerves: There is good facial symmetry. Extraocular muscles are intact. The visual fields are full to confrontational testing. The speech is fluent and clear. Soft palate rises symmetrically and there is no tongue deviation. Hearing is intact to conversational tone. Sensation: Sensation is intact to light and pinprick throughout (facial, trunk, extremities). Vibration is intact at  the bilateral big toe. There is no extinction with double simultaneous stimulation. There is no sensory dermatomal level identified. Motor: Strength is 5/5 in the bilateral upper and lower extremities.   Shoulder shrug is equal and symmetric.  There is no pronator drift. Deep tendon reflexes: Deep tendon reflexes are 2/4 at the bilateral biceps, triceps, brachioradialis, 3+ at the bilateral patella with cross adductor reflexes and achilles. Plantar responses are downgoing bilaterally.  Movement examination: Tone: There is mod increased tone in the RUE.  Mild increased LUE.  Mild increased RLE.  Normal LLE Abnormal movements: none Coordination:  There is decremation with RAM's, with any form of RAMS, including alternating supination and pronation of the forearm, hand opening and closing, finger taps, heel taps and toe taps, R>L Gait and Station: The patient has no difficulty arising out of a deep-seated chair without the use  of the hands. The patient's stride length is slightly decreased, no shuffling.  He is forward flexed.  He has marked decreased arm swing bilaterally, r>l.  The patient has a positive pull test.       Total time spent on today's visit was , including both face-to-face time and nonface-to-face time.  Time included that spent on review of records (prior notes available to me/labs/imaging if pertinent), discussing treatment and goals, answering patient's questions and coordinating care.  Cc:  Darrow Bussing, MD

## 2022-02-27 ENCOUNTER — Encounter: Payer: Self-pay | Admitting: Neurology

## 2022-02-27 ENCOUNTER — Ambulatory Visit (INDEPENDENT_AMBULATORY_CARE_PROVIDER_SITE_OTHER): Payer: Managed Care, Other (non HMO) | Admitting: Neurology

## 2022-02-27 VITALS — BP 145/89 | HR 79 | Ht 69.0 in | Wt 189.6 lb

## 2022-02-27 DIAGNOSIS — M545 Low back pain, unspecified: Secondary | ICD-10-CM

## 2022-02-27 DIAGNOSIS — G20A1 Parkinson's disease without dyskinesia, without mention of fluctuations: Secondary | ICD-10-CM

## 2022-02-27 DIAGNOSIS — R292 Abnormal reflex: Secondary | ICD-10-CM

## 2022-02-27 DIAGNOSIS — R2681 Unsteadiness on feet: Secondary | ICD-10-CM | POA: Diagnosis not present

## 2022-02-27 MED ORDER — CARBIDOPA-LEVODOPA 25-100 MG PO TABS: 1.0000 | ORAL_TABLET | Freq: Three times a day (TID) | ORAL | 1 refills | Status: DC

## 2022-02-27 NOTE — Patient Instructions (Addendum)
Start Carbidopa Levodopa as follows: Take 1/2 tablet three times daily, at least 30 minutes before meals (approximately 7am/11am/4pm), for one week Then take 1/2 tablet in the morning, 1/2 tablet in the afternoon, 1 tablet in the evening, at least 30 minutes before meals, for one week Then take 1/2 tablet in the morning, 1 tablet in the afternoon, 1 tablet in the evening, at least 30 minutes before meals, for one week Then take 1 tablet three times daily at 7am/11am/4pm, at least 30 minutes before meals   As a reminder, carbidopa/levodopa can be taken at the same time as a carbohydrate, but we like to have you take your pill either 30 minutes before a protein source or 1 hour after as protein can interfere with carbidopa/levodopa absorption.  As we discussed, it used to be thought that levodopa would increase risk of melanoma but now it is believed that Parkinsons itself likely increases risk of melanoma. I recommend yearly skin checks with a board certified dermatologist.  You can call [facility] or [facility] for an appointment.  [facility] Address: [address], [place], [place] [address] Phone: [phone]  [facility], [place], [place] Phone: [phone]  A referral to [facility] has been placed for your MRI someone will contact you directly to schedule your appt. They are located at [facility] [place]. Please contact them directly by calling [phone] with any questions regarding your referral.  Local and Online Resources for Power over Parkinson's Group  December 2023    [place] over Parkinson's Group:    Power Over Parkinson's Patient Education Group will be Wednesday, December 13th-*Hybrid meting*- in person at [facility] location and via [facility], 2:00-3:00 pm.   Starting in November, Power over [employer] and  Care Partner Groups will meet together, with plans for separate break out session for caregivers (*this will be evolving over the next few months) Upcoming Power over Parkinson's Meetings/Care Partner Support:  2nd Wednesdays of the month at 2 pm:   December 13th, January 10th  [place] at [email or web address] if interested in participating in this group    [place] Party!  Wednesday, December 6th, 4:00-5:00 pm.  [facility] and Fitness.  RSVP to Garnetta Buddy at [phone] or [email or web address] New PWR! Moves Dynegy Instructor-Led Classes offering at [employer]!  TUESDAYS and Wednesdays 1-2 pm.   Contact Vonna Kotyk at  [email or web address]  or [phone] (Tuesday classes are modified for chair and standing only) Dance for Parkinson 's classes will be on Tuesdays 9:30am-10:30am starting October 3-December 12 with a break the week of November 21st. Located in the [employer] , in the first floor of the [employer] ([place] To register:  [email or web address] or [phone]  Drumming for Parkinson's will be held on 2nd and 4th Mondays at 11:00 am.   Located at the [place] ([place]  [place] at [email or web address] or [phone]  Through support from the [place] for Parkinson's classes are free for both patients and caregivers.    [employer] Parkinson's Tai Chi Class, Mondays at 11 am.  Call [phone] for details   [place]:  [email or web address]  PD Health at Home continues:  Mindfulness Mondays, Wellness Wednesdays, Fitness Fridays   Upcoming  Education:    Eating and Feeling Well through the [place]. Wednesday, Dec. 6th,  1-2 pm  Hospital Safety.  Wednesday, Dec. 13th, 1-2 pm Register for expert  briefings (webinars) at [email or web address]  Please check out their website to sign up for emails and see their full online offerings      [place]:  [email or web address]   Third Thursday Webinars:  On the third Thursday of every month at 12 p.m. ET, join our free live webinars to learn about various aspects of living with Parkinson's disease and our work to speed medical breakthroughs.  Upcoming Webinar:  Tools for Diagnosing and Visualizing Parkinson's Disease.  Thursday, December 21st at 12 noon. Check out additional information on their website to see their full online offerings    [facility]:  [email or web address]  Upcoming Webinar:   Stay tuned  Webinar Series:  Living with Parkinson's Meetup.   Third Thursdays each month, 3 pm  Care Partner Monthly Meetup.  With Robin Searing Phinney.  First Tuesday of each month, 2 pm  Check out additional information to Live Well Today on their website    Parkinson and Movement Disorders (PMD) Alliance:  www.[email or web address]  [employer]:  Online Education Events  Sign up for emails, which are sent weekly to give you updates on programming and online offerings    Parkinson's Association of the [place]:  www.[email or web address]  Information on online support groups, education events, and online exercises including Yoga, Parkinson's exercises and more-LOTS of information on links to PD resources and online events  Virtual Support Group through Parkinson's Association of the [place]; next one is scheduled for Wednesday, December 6th  at 2 pm.  (These are typically scheduled for the 1st Wednesday of the month at 2 pm).  Visit website for details.   MOVEMENT AND EXERCISE OPPORTUNITIES  PWR! Moves Classes at [place].  Wednesdays 10 and 11 am.   Contact Amy Marriott, PT [email or web address] if interested.  NEW  PWR! Moves Class offerings at [employer].  *TUESDAYS* and Wednesdays 1-2 pm.    Contact Vonna Kotyk at  [email or web address]    Parkinson's Wellness Recovery (PWR! Moves)  [email or web address]  Info on the PWR! Virtual Experience:  You will have access to our expertise?through self-assessment, guided plans that start with the PD-specific fundamentals, educational content, tips, Q&A with an expert, and a growing Art therapist of PD-specific pre-recorded and live exercise classes of varying types and intensity - both physical and cognitive! If that is not enough, we offer 1:1 wellness consultations (in-person or virtual) to personalize your PWR! Research scientist (medical).   [place] Fridays:   As part of the PD Health @ Home program, this free video series focuses each week on one aspect of fitness designed to support people living with Parkinson's.? These weekly videos highlight the [place] fitness guidelines for people with Parkinson's disease.  [email or web address]   Dance for PD website is offering free, live-stream classes throughout the week, as well as links to [employer] of classes:  [email or web address]  Virtual dance and Pilates for Parkinson's classes: Click on the Community Tab> Parkinson's Movement Initiative Tab.  To register for classes and for more information, visit [email or web address] and click the "community" tab.   [employer] Parkinson's Cycling Classes   [employer]:  Thursdays @ Noon-Live classes at [employer] ([employer] at [email or web address]?or [phone])  [facility] [employer]: Virtual Classes Mondays and Thursdays Jeanette Caprice classes Tuesday, Wednesday and Thursday (contact [place] at [email or web address] ?or [phone])  [facility]  [employer]  Varied levels of classes are offered Tuesdays and Thursdays at [employer].   Stretching  with Verdis Frederickson weekly class is also offered for people with Parkinson's  To observe a class or for more information, call [phone] or email Hezzie Bump at [email or web address]   ADDITIONAL SUPPORT AND RESOURCES  [employer]:Online Caregiver Education Opportunities:  [email or web address]/[email or web address]/[email or web address]-support-group.  You may also contact Vickki Muff at [email or web address] or [phone].     Well[employer] Navigator:  Just1Navigator program, a?free service to help individuals and families through the journey of determining care for older adults.  The "Navigator" is a Education officer, museum, Arnell Asal, who will speak with a prospective client and/or loved ones to provide an assessment of the situation and a set of recommendations for a personalized care plan -- all free of charge, and whether?[employer] offers the needed service or not. If the need is not a service we provide, we are well-connected with reputable programs in town that we can refer you to.  www.well-[email or web address] or to speak with the Navigator, call [phone].

## 2022-03-10 ENCOUNTER — Ambulatory Visit: Payer: 59 | Attending: Neurology | Admitting: Physical Therapy

## 2022-03-10 ENCOUNTER — Encounter: Payer: Self-pay | Admitting: Physical Therapy

## 2022-03-10 ENCOUNTER — Other Ambulatory Visit: Payer: Self-pay

## 2022-03-10 ENCOUNTER — Ambulatory Visit (INDEPENDENT_AMBULATORY_CARE_PROVIDER_SITE_OTHER): Payer: Self-pay | Admitting: Psychology

## 2022-03-10 DIAGNOSIS — R29818 Other symptoms and signs involving the nervous system: Secondary | ICD-10-CM | POA: Diagnosis present

## 2022-03-10 DIAGNOSIS — G20A1 Parkinson's disease without dyskinesia, without mention of fluctuations: Secondary | ICD-10-CM | POA: Insufficient documentation

## 2022-03-10 DIAGNOSIS — R2689 Other abnormalities of gait and mobility: Secondary | ICD-10-CM

## 2022-03-10 DIAGNOSIS — G20C Parkinsonism, unspecified: Secondary | ICD-10-CM

## 2022-03-10 DIAGNOSIS — M6281 Muscle weakness (generalized): Secondary | ICD-10-CM | POA: Insufficient documentation

## 2022-03-10 DIAGNOSIS — R2681 Unsteadiness on feet: Secondary | ICD-10-CM | POA: Diagnosis present

## 2022-03-10 DIAGNOSIS — G3184 Mild cognitive impairment, so stated: Secondary | ICD-10-CM

## 2022-03-10 DIAGNOSIS — G4752 REM sleep behavior disorder: Secondary | ICD-10-CM

## 2022-03-10 NOTE — Progress Notes (Signed)
   Neuropsychology Feedback Session Tillie Rung. [place] Department of Neurology  Reason for Referral:   MANUAL NAVARRA is a 57 y.o. right-handed African-American male referred by Sharene Butters, PA-C, to characterize his current cognitive functioning and assist with diagnostic clarity and treatment planning in the context of subjective cognitive decline, abnormal DaTscan, and the presence of several parkinsonisms.   Feedback:   Mr. Pokorny completed a comprehensive neuropsychological evaluation on 02/23/2022. Please refer to that encounter for the full report and recommendations. Briefly, results suggested prominent impairment surrounding cognitive flexibility, visuospatial abilities, and most aspects of verbal memory. Confrontation naming was also low; however, he only missed three items across this task and this may represent a normative impairment more so than an actual clinical impairment. While processing speed and attention/concentration were unable to be adequately assessed, I do find it likely for there to be ongoing dysfunction across these areas as well. Regarding etiology, I have concerns surrounding a neurodegenerative illness being present, particularly Lewy body disease. He previously completed a DaTscan which suggested abnormalities in line with a condition along the parkinsonian spectrum. Behaviorally, Mr. Montenegro described/exhibited bradykinesia, mild tremors, micrographia, and hypophonia. He also described prominent REM sleep behaviors, as well as motor abnormalities originating around the same time as cognitive impairment. Specific to cognition, prominent impairment surrounding executive functioning (cognitive flexibility) and visuospatial abilities is the expected finding early on in this illness. Many of these symptoms can also be seen in Parkinson's disease and this illness cannot be ruled out via cognitive testing. Medication side effects, namely sertraline  (Zoloft) and tramadol (Ultram), have been known exacerbate parkinsonian symptoms (and artificially alter DaTscan results) and worsen cognitive functioning respectively. It is unclear how much these variables may be contributing to his current presentation.   Mr. Lichty was accompanied by his wife during the current feedback session. Content of the current session focused on the results of his neuropsychological evaluation. Mr. Krauser was given the opportunity to ask questions and his questions were answered. He was encouraged to reach out should additional questions arise. A copy of his report was provided at the conclusion of the visit.      25 minutes were spent conducting the current feedback session with Mr. Alonso, billed as one unit [phone].

## 2022-03-10 NOTE — Therapy (Signed)
OUTPATIENT PHYSICAL THERAPY NEURO EVALUATION   Patient Name: Tyler Dickerson MRN: [id number] DOB:04-13-1965, 57 y.o., male Today's Date: 03/10/2022   PCP: Darrow Bussing, MD REFERRING PROVIDER: Vladimir Faster, DO   END OF SESSION:  PT End of Session - 03/10/22 1049     Visit Number 1    Number of Visits 18    Date for PT Re-Evaluation 05/09/22    Authorization Type [employer]?-pt to check    PT Start Time 1055    PT Stop Time 1142    PT Time Calculation (min) 47 min    Activity Tolerance Patient tolerated treatment well    Behavior During Therapy [facility] for tasks assessed/performed             Past Medical History:  Diagnosis Date   Atypical chest pain 11/03/2016   Dyslipidemia 11/03/2016   Family history of abdominal aortic aneurysm (AAA) 11/03/2016   Family history of peripheral arterial disease 11/03/2016   History of COVID-19 10/2020   Hypercholesteremia    Mild   Hyperplastic colon polyp    Hypertension    Mild cognitive impairment, concerns for Lewy body disease 02/23/2022   Parkinsonism    REM sleep behavior disorder    Tremor    Past Surgical History:  Procedure Laterality Date   COLONOSCOPY  2015   REPLACEMENT TOTAL KNEE Left 1996   Patient Active Problem List   Diagnosis Date Noted   Hyperplastic colon polyp 02/23/2022   Hypertension 02/23/2022   REM sleep behavior disorder 02/23/2022   Mild cognitive impairment, concerns for Lewy body disease 02/23/2022   Tremor    Parkinsonism    Atypical chest pain 11/03/2016   Dyslipidemia 11/03/2016   Family history of peripheral arterial disease 11/03/2016   Family history of abdominal aortic aneurysm (AAA) 11/03/2016    ONSET DATE: 02/27/2022 (MD referral)  REFERRING DIAG: G20.A1 (ICD-10-CM) - Parkinson's disease without dyskinesia or fluctuating manifestations   THERAPY DIAG:  Other abnormalities of gait and mobility  Unsteadiness on feet  Muscle weakness (generalized)  Other symptoms and signs  involving the nervous system  Rationale for Evaluation and Treatment: Rehabilitation  SUBJECTIVE:                                                                                                                                                                                             SUBJECTIVE STATEMENT: Want to be able to lift up things, walk better without dragging my feet, get rid of soreness in my body; to make it easier to stand up from the floor.  Just started taking the Carbidopa-Levodopa.  Interfering with  my work Pt accompanied by: self  PERTINENT HISTORY: Hx of LBP (saw PT 2023), HLD, HTN, MCI, Parkinsonism, REM sleep disorder  PAIN:  Are you having pain? No Reports having pain when he starts to move-pain in back and hip (5-6/10)  PRECAUTIONS: Fall  WEIGHT BEARING RESTRICTIONS: No  FALLS: Has patient fallen in last 6 months? No  LIVING ENVIRONMENT: Lives with: lives with their spouse Lives in: House/apartment Stairs: Yes: External: 3 steps; none Has following equipment at home: None  PLOF: Independent, Vocation/Vocational requirements: lifting up to 85 lbs, and Leisure: enjoys bowling-hasn't done in several years  PATIENT GOALS: To improve moving around better, to get back to bowling.  OBJECTIVE:   DIAGNOSTIC FINDINGS: DaTscan 01/14/2022:  Bilateral significant decreased radiotracer activity within the striata. Findings consistent with significant loss of dopamine transport populations. Findings associated with Parkinson's syndrome pathology.  COGNITION: Overall cognitive status:  Pt reports easily fatigued; reports "as you are talking, my mind is all over"   SENSATION: Light touch: WFL  COORDINATION: Slowed overall motion  MUSCLE TONE: LLE: Moderate RLE tone:  minimal increase with P/RoM POSTURE: rounded shoulders and forward head  LOWER EXTREMITY ROM:   WFL except ankle dorsiflexion  Active  Right Eval Left Eval  Hip flexion    Hip extension     Hip abduction    Hip adduction    Hip internal rotation    Hip external rotation    Knee flexion    Knee extension    Ankle dorsiflexion 3 3  Ankle plantarflexion    Ankle inversion    Ankle eversion     (Blank rows = not tested)  LOWER EXTREMITY MMT:    MMT Right Eval Left Eval  Hip flexion 4 4  Hip extension    Hip abduction    Hip adduction    Hip internal rotation    Hip external rotation    Knee flexion 4 4  Knee extension 4 4  Ankle dorsiflexion 4 4  Ankle plantarflexion    Ankle inversion    Ankle eversion    (Blank rows = not tested)   TRANSFERS: Assistive device utilized: None  Sit to stand: Modified independence Stand to sit: Modified independence   STAIRS: Level of Assistance: Modified independence Stair Negotiation Technique: Alternating Pattern  with No Rails Number of Stairs: 2-3  Height of Stairs: 4-6"  Comments: Slowed, reports weakness at times with negotiating steps at home.  GAIT: Gait pattern: step through pattern, decreased arm swing- Right, decreased arm swing- Left, decreased step length- Right, decreased step length- Left, decreased ankle dorsiflexion- Right, decreased ankle dorsiflexion- Left, narrow BOS, poor foot clearance- Right, and poor foot clearance- Left Distance walked: 50 ft x 4 reps Assistive device utilized: None Level of assistance: Modified independence Comments: Able to improve foot clearance and step length with cues for BIG intention  FUNCTIONAL TESTS:  5 times sit to stand: 17.68 sec (no UE support) Timed up and go (TUG): 15.56 sec TUG cognitive: 17.13 sec MiniBESTest:  17/28 7M:  14.06 sec = 2.33 ft/sec  [facility] PT Assessment - 03/10/22 0001       Standardized Balance Assessment   Standardized Balance Assessment Mini-BESTest      Mini-BESTest   Sit To Stand Normal: Comes to stand without use of hands and stabilizes independently.    Rise to Toes Normal: Stable for 3 s with maximum height.    Stand on one  leg (left) Moderate: < 20 s  12.66, 10.12   Stand on one leg (right) Moderate: < 20 s   1.65, 4.84 sec   Stand on one leg - lowest score 1    Compensatory Stepping Correction - Forward No step, OR would fall if not caught, OR falls spontaneously.    Compensatory Stepping Correction - Backward No step, OR would fall if not caught, OR falls spontaneously.    Compensatory Stepping Correction - Left Lateral Severe: Falls, or cannot step    Compensatory Stepping Correction - Right Lateral Severe:  Falls, or cannot step    Stepping Corredtion Lateral - lowest score 0    Stance - Feet together, eyes open, firm surface  Normal: 30s    Stance - Feet together, eyes closed, foam surface  Normal: 30s    Incline - Eyes Closed Normal: Stands independently 30s and aligns with gravity    Change in Gait Speed Normal: Significantly changes walkling speed without imbalance    Walk with head turns - Horizontal Moderate: performs head turns with reduction in gait speed.   7.75/8.5   Walk with pivot turns Moderate:Turns with feet close SLOW (>4 steps) with good balance.   3.75   Step over obstacles Moderate: Steps over box but touches box OR displays cautious behavior by slowing gait.    Timed UP & GO with Dual Task Moderate: Dual Task affects either counting OR walking (>10%) when compared to the TUG without Dual Task.    Mini-BEST total score 17              TODAY'S TREATMENT:                                                                                                                              DATE: 03/10/2022    PATIENT EDUCATION: Education details: PT eval results, POC Person educated: Patient Education method: Explanation Education comprehension: verbalized understanding  HOME EXERCISE PROGRAM: Not yet initiated  GOALS: Goals reviewed with patient? Yes  SHORT TERM GOALS: Target date: 04/10/2022  Pt will be independent with HEP for improved strength, balance, gait. Baseline: Goal  status: INITIAL  2.  Pt will improve 5x sit<>stand to less than or equal to 13 sec to demonstrate improved functional strength and transfer efficiency. Baseline: 17.68 sec Goal status: INITIAL  3.  Pt will improve gait velocity to at least 2.62 ft/sec for improved gait efficiency and safety. Baseline: 2.33 ft/sec Goal status: INITIAL  4.  Pt will verbalize understanding of local Parkinson's disease community resources. Baseline:  Goal status: INITIAL  LONG TERM GOALS: Target date: 05/08/2022  Pt will be independent with HEP for improved strength, balance, gait. Baseline:  Goal status: INITIAL  2.  Pt will improve 5x sit<>stand to less than or equal to 11 sec to demonstrate improved functional strength and transfer efficiency. Baseline: 17.68 sec Goal status: INITIAL  3.  Pt will improve TUG score to less than or equal to 13.5 sec for  decreased fall risk.  Baseline: 15.56 sec Goal status: INITIAL  4.  Pt will improve MiniBESTest score to at least 22/28 to decrease fall risk. Baseline: 17/28 Goal status: INITIAL  ASSESSMENT:  CLINICAL IMPRESSION: Patient is a 57 y.o. male who was seen today for physical therapy evaluation and treatment for Parkinson's disease.  He has had a recent DaTscan and recently started Sinemet medication.  He reports symptoms progression of slowness, decreased energy, decreased strength over the past two year.  He demonstrated increased stiffness to P/ROM in BLEs; he demonstrates decreased bilateral arm swing with gait.  He presents with decreased functional strength, decreased balance, decreased timing and coordination of gait, abnormal posture, postural instability (tendency to not leave his BOS with balance challenges).  He works and reports enjoys bowling (but has not done this in several years).  He would benefit from skilled PT to address the above stated deficits to decrease fall risk and improve overall functional mobility.   OBJECTIVE IMPAIRMENTS:  Abnormal gait, decreased balance, decreased coordination, decreased mobility, difficulty walking, decreased ROM, decreased strength, impaired flexibility, and postural dysfunction.   ACTIVITY LIMITATIONS: carrying, lifting, bending, standing, squatting, stairs, transfers, and locomotion level  PARTICIPATION LIMITATIONS: community activity, occupation, and bowling activities  PERSONAL FACTORS: 3+ comorbidities: PMH above  are also affecting patient's functional outcome.   REHAB POTENTIAL: Good  CLINICAL DECISION MAKING: Evolving/moderate complexity  EVALUATION COMPLEXITY: Moderate  PLAN:  PT FREQUENCY: 2x/week  PT DURATION: 8 weeks plus 1x/wk of eval = 9 week POC  PLANNED INTERVENTIONS: Therapeutic exercises, Therapeutic activity, Neuromuscular re-education, Balance training, Gait training, Patient/Family education, Self Care, Stair training, and DME instructions  PLAN FOR NEXT SESSION: Initiate PWR! Moves:  sitting and standing.  Gait training with walking poles to facilitate reciprocal arm swing.  Aerobic activity on NuStep   Steed Kanaan W., PT 03/10/2022, 12:04 PM  [facility] at [facility] [address] [place], [address] [place], [place] [address] Phone # [phone] Fax # [phone]

## 2022-03-12 ENCOUNTER — Encounter: Payer: Self-pay | Admitting: Physical Therapy

## 2022-03-12 ENCOUNTER — Ambulatory Visit: Payer: 59 | Admitting: Physical Therapy

## 2022-03-12 DIAGNOSIS — R2681 Unsteadiness on feet: Secondary | ICD-10-CM

## 2022-03-12 DIAGNOSIS — R2689 Other abnormalities of gait and mobility: Secondary | ICD-10-CM | POA: Diagnosis not present

## 2022-03-12 DIAGNOSIS — R29818 Other symptoms and signs involving the nervous system: Secondary | ICD-10-CM

## 2022-03-12 DIAGNOSIS — M6281 Muscle weakness (generalized): Secondary | ICD-10-CM

## 2022-03-12 NOTE — Therapy (Signed)
OUTPATIENT PHYSICAL THERAPY NEURO TREATMENT NOTE   Patient Name: Tyler Dickerson MRN: [id number] DOB:[birth year], 57 y.o., male Today's Date: 03/12/2022   PCP: Lujean Amel, MD REFERRING PROVIDER: Ludwig Clarks, DO   END OF SESSION:  PT End of Session - 03/12/22 1150     Visit Number 2    Number of Visits 18    Date for PT Re-Evaluation 05/09/22    Authorization Type [employer]?-pt to check    PT Start Time 1148    PT Stop Time 1227    PT Time Calculation (min) 39 min    Activity Tolerance Patient tolerated treatment well    Behavior During Therapy [facility] for tasks assessed/performed             Past Medical History:  Diagnosis Date   Atypical chest pain 11/03/2016   Dyslipidemia 11/03/2016   Family history of abdominal aortic aneurysm (AAA) 11/03/2016   Family history of peripheral arterial disease 11/03/2016   History of COVID-19 10/2020   Hypercholesteremia    Mild   Hyperplastic colon polyp    Hypertension    Mild cognitive impairment, concerns for Lewy body disease 02/23/2022   Parkinsonism    REM sleep behavior disorder    Tremor    Past Surgical History:  Procedure Laterality Date   COLONOSCOPY  2015   REPLACEMENT TOTAL KNEE Left 1996   Patient Active Problem List   Diagnosis Date Noted   Hyperplastic colon polyp 02/23/2022   Hypertension 02/23/2022   REM sleep behavior disorder 02/23/2022   Mild cognitive impairment, concerns for Lewy body disease 02/23/2022   Tremor    Parkinsonism    Atypical chest pain 11/03/2016   Dyslipidemia 11/03/2016   Family history of peripheral arterial disease 11/03/2016   Family history of abdominal aortic aneurysm (AAA) 11/03/2016    ONSET DATE: 02/27/2022 (MD referral)  REFERRING DIAG: G20.A1 (ICD-10-CM) - Parkinson's disease without dyskinesia or fluctuating manifestations   THERAPY DIAG:  Other abnormalities of gait and mobility  Unsteadiness on feet  Muscle weakness (generalized)  Other symptoms and  signs involving the nervous system  Rationale for Evaluation and Treatment: Rehabilitation  SUBJECTIVE:                                                                                                                                                                                             SUBJECTIVE STATEMENT: No changes since last visit.   Pt accompanied by: self  PERTINENT HISTORY: Hx of LBP (saw PT 2023), HLD, HTN, MCI, Parkinsonism, REM sleep disorder  PAIN:  Are you having pain? Yes: NPRS scale: 4/10 Pain  location: L knee Pain description: achy Aggravating factors: maybe weather Relieving factors: ice/heat, sitting down  PRECAUTIONS: Fall  WEIGHT BEARING RESTRICTIONS: No  FALLS: Has patient fallen in last 6 months? No  LIVING ENVIRONMENT: Lives with: lives with their spouse Lives in: House/apartment Stairs: Yes: External: 3 steps; none Has following equipment at home: None  PLOF: Independent, Vocation/Vocational requirements: lifting up to 85 lbs, and Leisure: enjoys bowling-hasn't done in several years  PATIENT GOALS: To improve moving around better, to get back to bowling.  OBJECTIVE:    TODAY'S TREATMENT: 03/12/2022 Activity Comments  NuStep, Level 3, 4 extremities x 6 minutes for aerobic warm-up Cues to keep SPM >70-80 (pt's self-selected pace is 62);  Rates effort level 6/10   PWR! Moves in sitting See below  Gait training  -with focus on large amplitude movements 50 ft x 2 -using bilateral walking poles to facilitate reciprocal arm swing 50 ft x 4 -poles removed with cues to keep arm swing going -improved step length, still decreased bilateral arm swing when not using bilateral walking poles for facilitation  Standing PWR! Up x 5 reps Standing PWR! Rock x 5 reps each side Max verbal and visual cues for technique         Pt performs PWR! Moves in sitting position x 10 reps   PWR! Up for improved posture  PWR! Rock for improved weighshifting  PWR!  Twist for improved trunk rotation   PWR! Step for improved step initiation   Cues provided for  technique and intensity of movement.    PATIENT EDUCATION: Education details: HEP-PWR! Moves provided (see pt instructions) Person educated: Patient Education method: Explanation, Demonstration, Tactile cues, Verbal cues, and Handouts Education comprehension: verbalized understanding, returned demonstration, tactile cues required, and needs further education  ---------------------------------------------------------------------------------------------------- Objective measures below taken at initial evaluation:  DIAGNOSTIC FINDINGS: DaTscan 01/14/2022:  Bilateral significant decreased radiotracer activity within the striata. Findings consistent with significant loss of dopamine transport populations. Findings associated with Parkinson's syndrome pathology.  COGNITION: Overall cognitive status:  Pt reports easily fatigued; reports "as you are talking, my mind is all over"   SENSATION: Light touch: WFL  COORDINATION: Slowed overall motion  MUSCLE TONE: LLE: Moderate RLE tone:  minimal increase with P/RoM POSTURE: rounded shoulders and forward head  LOWER EXTREMITY ROM:   WFL except ankle dorsiflexion  Active  Right Eval Left Eval  Hip flexion    Hip extension    Hip abduction    Hip adduction    Hip internal rotation    Hip external rotation    Knee flexion    Knee extension    Ankle dorsiflexion 3 3  Ankle plantarflexion    Ankle inversion    Ankle eversion     (Blank rows = not tested)  LOWER EXTREMITY MMT:    MMT Right Eval Left Eval  Hip flexion 4 4  Hip extension    Hip abduction    Hip adduction    Hip internal rotation    Hip external rotation    Knee flexion 4 4  Knee extension 4 4  Ankle dorsiflexion 4 4  Ankle plantarflexion    Ankle inversion    Ankle eversion    (Blank rows = not tested)   TRANSFERS: Assistive device utilized: None  Sit to  stand: Modified independence Stand to sit: Modified independence   STAIRS: Level of Assistance: Modified independence Stair Negotiation Technique: Alternating Pattern  with No Rails Number of Stairs: 2-3  Height of  Stairs: 4-6"  Comments: Slowed, reports weakness at times with negotiating steps at home.  GAIT: Gait pattern: step through pattern, decreased arm swing- Right, decreased arm swing- Left, decreased step length- Right, decreased step length- Left, decreased ankle dorsiflexion- Right, decreased ankle dorsiflexion- Left, narrow BOS, poor foot clearance- Right, and poor foot clearance- Left Distance walked: 50 ft x 4 reps Assistive device utilized: None Level of assistance: Modified independence Comments: Able to improve foot clearance and step length with cues for BIG intention  FUNCTIONAL TESTS:  5 times sit to stand: 17.68 sec (no UE support) Timed up and go (TUG): 15.56 sec TUG cognitive: 17.13 sec MiniBESTest:  17/28 74M:  14.06 sec = 2.33 ft/sec     TODAY'S TREATMENT:                                                                                                                              DATE: 03/10/2022    PATIENT EDUCATION: Education details: PT eval results, POC Person educated: Patient Education method: Explanation Education comprehension: verbalized understanding  HOME EXERCISE PROGRAM: Not yet initiated  GOALS: Goals reviewed with patient? Yes  SHORT TERM GOALS: Target date: 04/10/2022  Pt will be independent with HEP for improved strength, balance, gait. Baseline: Goal status: INITIAL  2.  Pt will improve 5x sit<>stand to less than or equal to 13 sec to demonstrate improved functional strength and transfer efficiency. Baseline: 17.68 sec Goal status: INITIAL  3.  Pt will improve gait velocity to at least 2.62 ft/sec for improved gait efficiency and safety. Baseline: 2.33 ft/sec Goal status: INITIAL  4.  Pt will verbalize understanding of  local Parkinson's disease community resources. Baseline:  Goal status: INITIAL  LONG TERM GOALS: Target date: 05/08/2022  Pt will be independent with HEP for improved strength, balance, gait. Baseline:  Goal status: INITIAL  2.  Pt will improve 5x sit<>stand to less than or equal to 11 sec to demonstrate improved functional strength and transfer efficiency. Baseline: 17.68 sec Goal status: INITIAL  3.  Pt will improve TUG score to less than or equal to 13.5 sec for decreased fall risk.  Baseline: 15.56 sec Goal status: INITIAL  4.  Pt will improve MiniBESTest score to at least 22/28 to decrease fall risk. Baseline: 17/28 Goal status: INITIAL  ASSESSMENT:  CLINICAL IMPRESSION: Skilled PT session today focused on aerobic exercise followed by PWR! Moves to emphasize large amplitude, whole body movement patterns.  Pt requires multi-modal cues (verbal, tactile, demo/visual cues of mirror) to aid in optimal technique and intensity.  With gait training activity using bilateral walking poles to facilitate reciprocal arm swing, pt does well with the poles in place, but reverts back to decreased to no BUE arm swing when poles removed.  Pt will need reinforcement in whole body, large amplitude movement patterns.    OBJECTIVE IMPAIRMENTS: Abnormal gait, decreased balance, decreased coordination, decreased mobility, difficulty walking, decreased ROM, decreased strength, impaired flexibility, and  postural dysfunction.   ACTIVITY LIMITATIONS: carrying, lifting, bending, standing, squatting, stairs, transfers, and locomotion level  PARTICIPATION LIMITATIONS: community activity, occupation, and bowling activities  PERSONAL FACTORS: 3+ comorbidities: PMH above  are also affecting patient's functional outcome.   REHAB POTENTIAL: Good  CLINICAL DECISION MAKING: Evolving/moderate complexity  EVALUATION COMPLEXITY: Moderate  PLAN:  PT FREQUENCY: 2x/week  PT DURATION: 8 weeks plus 1x/wk of eval =  9 week POC  PLANNED INTERVENTIONS: Therapeutic exercises, Therapeutic activity, Neuromuscular re-education, Balance training, Gait training, Patient/Family education, Self Care, Stair training, and DME instructions  PLAN FOR NEXT SESSION: Review PWR! Moves in sitting and work on standing PWR! Moves.  Gait training with walking poles to facilitate reciprocal arm swing.  Aerobic activity on NuStep.  Squats and floor to stand transfers.   Gean Maidens., PT 03/12/2022, 12:26 PM   Outpatient Rehab at [facility] [address] [place], [address] [place], [place] [address] Phone # [phone] Fax # [phone]

## 2022-03-16 ENCOUNTER — Ambulatory Visit: Payer: Managed Care, Other (non HMO) | Admitting: Physician Assistant

## 2022-03-17 ENCOUNTER — Encounter: Payer: Self-pay | Admitting: Physical Therapy

## 2022-03-17 ENCOUNTER — Ambulatory Visit: Payer: 59 | Admitting: Physical Therapy

## 2022-03-17 DIAGNOSIS — R2689 Other abnormalities of gait and mobility: Secondary | ICD-10-CM

## 2022-03-17 DIAGNOSIS — R29818 Other symptoms and signs involving the nervous system: Secondary | ICD-10-CM

## 2022-03-17 DIAGNOSIS — R2681 Unsteadiness on feet: Secondary | ICD-10-CM

## 2022-03-17 DIAGNOSIS — M6281 Muscle weakness (generalized): Secondary | ICD-10-CM

## 2022-03-17 NOTE — Therapy (Signed)
OUTPATIENT PHYSICAL THERAPY NEURO TREATMENT NOTE   Patient Name: Tyler Dickerson MRN: [id number] DOB:1966-01-16, 57 y.o., male Today's Date: 03/17/2022   PCP: Lujean Amel, MD REFERRING PROVIDER: Ludwig Clarks, DO   END OF SESSION:  PT End of Session - 03/17/22 1227     Visit Number 3    Number of Visits 18    Date for PT Re-Evaluation 05/09/22    Authorization Type Receptionist asked pt for new card-as he has not presented a new card as of 03/17/22 visit    PT Start Time 1232    PT Stop Time 4098    PT Time Calculation (min) 40 min    Activity Tolerance Patient tolerated treatment well    Behavior During Therapy [facility] for tasks assessed/performed             Past Medical History:  Diagnosis Date   Atypical chest pain 11/03/2016   Dyslipidemia 11/03/2016   Family history of abdominal aortic aneurysm (AAA) 11/03/2016   Family history of peripheral arterial disease 11/03/2016   History of COVID-19 10/2020   Hypercholesteremia    Mild   Hyperplastic colon polyp    Hypertension    Mild cognitive impairment, concerns for Lewy body disease 02/23/2022   Parkinsonism    REM sleep behavior disorder    Tremor    Past Surgical History:  Procedure Laterality Date   COLONOSCOPY  2015   REPLACEMENT TOTAL KNEE Left 1996   Patient Active Problem List   Diagnosis Date Noted   Hyperplastic colon polyp 02/23/2022   Hypertension 02/23/2022   REM sleep behavior disorder 02/23/2022   Mild cognitive impairment, concerns for Lewy body disease 02/23/2022   Tremor    Parkinsonism    Atypical chest pain 11/03/2016   Dyslipidemia 11/03/2016   Family history of peripheral arterial disease 11/03/2016   Family history of abdominal aortic aneurysm (AAA) 11/03/2016    ONSET DATE: 02/27/2022 (MD referral)  REFERRING DIAG: G20.A1 (ICD-10-CM) - Parkinson's disease without dyskinesia or fluctuating manifestations   THERAPY DIAG:  Other abnormalities of gait and  mobility  Unsteadiness on feet  Muscle weakness (generalized)  Other symptoms and signs involving the nervous system  Rationale for Evaluation and Treatment: Rehabilitation  SUBJECTIVE:                                                                                                                                                                                             SUBJECTIVE STATEMENT: Did the exercises. Not sure I'm doing them right.    Feel like I'm not shuffling my feet as much. Pt accompanied by: self  PERTINENT HISTORY: Hx of LBP (saw PT 2023), HLD, HTN, MCI, Parkinsonism, REM sleep disorder  PAIN:  Are you having pain? Yes: NPRS scale: 2/10 Pain location: back Pain description: achy Aggravating factors: maybe weather Relieving factors: ice/heat, sitting down  PRECAUTIONS: Fall  WEIGHT BEARING RESTRICTIONS: No  FALLS: Has patient fallen in last 6 months? No  LIVING ENVIRONMENT: Lives with: lives with their spouse Lives in: House/apartment Stairs: Yes: External: 3 steps; none Has following equipment at home: None  PLOF: Independent, Vocation/Vocational requirements: lifting up to 85 lbs, and Leisure: enjoys bowling-hasn't done in several years  PATIENT GOALS: To improve moving around better, to get back to bowling.  OBJECTIVE:    TODAY'S TREATMENT: 03/17/2022 Activity Comments  NuStep, Level 3, 4 extremities x 6 minutes for aerobic warm-up Cues to keep SPM >70-80 (pt's self-selected pace is 62);  Rates effort level 6/10   Reviewed seated PWR! Moves-see below Mod cues for technique and intensity  Standing runner's stretch for gastroc stretch, 3 x 15"   Bilat heel/toe raises 10 reps   Forward step and weightshift, 2 x 5 reps, over obstacles   Forward stepping in parallel bars, counting steps  Starts with 5-6, able to take 4 larger steps  Short distance gait 30 ft, 6 reps, counting steps for larger step length and foot clearance Starts with 14 steps, able to  take 11-12   Pt performs PWR! Moves in sitting position x 10 reps   PWR! Up for improved posture-cues to open hands and straighten elbows  PWR! Rock for improved weighshifting-cues to include hip/leg to increase reach  PWR! Twist for improved trunk rotation-cues for brief pause in midline, loud clap  PWR! Step for improved step initiation - cues for technique and high step motion  Cues provided for  technique and intensity of movement.  Pt performs PWR! Moves in standing position x 5-10 reps   PWR! Up for improved posture  PWR! Rock for improved weighshifting  PWR! Twist for improved trunk rotation   PWR! Step for improved step initiation   Cues provided for technique and intensity  ---------------------------------------------------------------------------------------------------- Objective measures below taken at initial evaluation:  DIAGNOSTIC FINDINGS: DaTscan 01/14/2022:  Bilateral significant decreased radiotracer activity within the striata. Findings consistent with significant loss of dopamine transport populations. Findings associated with Parkinson's syndrome pathology.  COGNITION: Overall cognitive status:  Pt reports easily fatigued; reports "as you are talking, my mind is all over"   SENSATION: Light touch: WFL  COORDINATION: Slowed overall motion  MUSCLE TONE: LLE: Moderate RLE tone:  minimal increase with P/RoM POSTURE: rounded shoulders and forward head  LOWER EXTREMITY ROM:   WFL except ankle dorsiflexion  Active  Right Eval Left Eval  Hip flexion    Hip extension    Hip abduction    Hip adduction    Hip internal rotation    Hip external rotation    Knee flexion    Knee extension    Ankle dorsiflexion 3 3  Ankle plantarflexion    Ankle inversion    Ankle eversion     (Blank rows = not tested)  LOWER EXTREMITY MMT:    MMT Right Eval Left Eval  Hip flexion 4 4  Hip extension    Hip abduction    Hip adduction    Hip internal  rotation    Hip external rotation    Knee flexion 4 4  Knee extension 4 4  Ankle dorsiflexion 4 4  Ankle plantarflexion  Ankle inversion    Ankle eversion    (Blank rows = not tested)   TRANSFERS: Assistive device utilized: None  Sit to stand: Modified independence Stand to sit: Modified independence   STAIRS: Level of Assistance: Modified independence Stair Negotiation Technique: Alternating Pattern  with No Rails Number of Stairs: 2-3  Height of Stairs: 4-6"  Comments: Slowed, reports weakness at times with negotiating steps at home.  GAIT: Gait pattern: step through pattern, decreased arm swing- Right, decreased arm swing- Left, decreased step length- Right, decreased step length- Left, decreased ankle dorsiflexion- Right, decreased ankle dorsiflexion- Left, narrow BOS, poor foot clearance- Right, and poor foot clearance- Left Distance walked: 50 ft x 4 reps Assistive device utilized: None Level of assistance: Modified independence Comments: Able to improve foot clearance and step length with cues for BIG intention  FUNCTIONAL TESTS:  5 times sit to stand: 17.68 sec (no UE support) Timed up and go (TUG): 15.56 sec TUG cognitive: 17.13 sec MiniBESTest:  17/28 78M:  14.06 sec = 2.33 ft/sec     TODAY'S TREATMENT:                                                                                                                              DATE: 03/10/2022    PATIENT EDUCATION: Education details: PT eval results, POC Person educated: Patient Education method: Explanation Education comprehension: verbalized understanding  HOME EXERCISE PROGRAM: Not yet initiated  GOALS: Goals reviewed with patient? Yes  SHORT TERM GOALS: Target date: 04/10/2022  Pt will be independent with HEP for improved strength, balance, gait. Baseline: Goal status: INITIAL  2.  Pt will improve 5x sit<>stand to less than or equal to 13 sec to demonstrate improved functional strength and  transfer efficiency. Baseline: 17.68 sec Goal status: INITIAL  3.  Pt will improve gait velocity to at least 2.62 ft/sec for improved gait efficiency and safety. Baseline: 2.33 ft/sec Goal status: INITIAL  4.  Pt will verbalize understanding of local Parkinson's disease community resources. Baseline:  Goal status: INITIAL  LONG TERM GOALS: Target date: 05/08/2022  Pt will be independent with HEP for improved strength, balance, gait. Baseline:  Goal status: INITIAL  2.  Pt will improve 5x sit<>stand to less than or equal to 11 sec to demonstrate improved functional strength and transfer efficiency. Baseline: 17.68 sec Goal status: INITIAL  3.  Pt will improve TUG score to less than or equal to 13.5 sec for decreased fall risk.  Baseline: 15.56 sec Goal status: INITIAL  4.  Pt will improve MiniBESTest score to at least 22/28 to decrease fall risk. Baseline: 17/28 Goal status: INITIAL  ASSESSMENT:  CLINICAL IMPRESSION: Pt arrives to PT session, with no complaints, endorsing he feels he's not shuffling as much.  Also, he reports that he is taking full Carbidopa-Levodopa dose, as directed by Dr. Arbutus Leas.  Reviewed PWR! Moves in sitting, with pt needing cues for technique throughout.  Also performed standing PWR!  Moves, with pt needing max multi-modal cues for optimal performance.  Overall, pt does seem that he has improved fluidity with movement.   He responds well to cues to count steps to take larger steps, and he seems pleased with his ability to do this.  He also demo improved awareness of posture, self-correcting rounded shoulders in sitting.  He will continue to need reinforcement in whole body, large amplitude movement patterns.    OBJECTIVE IMPAIRMENTS: Abnormal gait, decreased balance, decreased coordination, decreased mobility, difficulty walking, decreased ROM, decreased strength, impaired flexibility, and postural dysfunction.   ACTIVITY LIMITATIONS: carrying, lifting, bending,  standing, squatting, stairs, transfers, and locomotion level  PARTICIPATION LIMITATIONS: community activity, occupation, and bowling activities  PERSONAL FACTORS: 3+ comorbidities: PMH above  are also affecting patient's functional outcome.   REHAB POTENTIAL: Good  CLINICAL DECISION MAKING: Evolving/moderate complexity  EVALUATION COMPLEXITY: Moderate  PLAN:  PT FREQUENCY: 2x/week  PT DURATION: 8 weeks plus 1x/wk of eval = 9 week POC  PLANNED INTERVENTIONS: Therapeutic exercises, Therapeutic activity, Neuromuscular re-education, Balance training, Gait training, Patient/Family education, Self Care, Stair training, and DME instructions  PLAN FOR NEXT SESSION: Review PWR! Moves in sitting and work on standing PWR! Moves.  Work on step through, larger step gait pattern. Gait training with walking poles to facilitate reciprocal arm swing. Squats and floor to stand transfers.  Aerobic activity on NuStep.     Myah Guynes W., PT 03/17/2022, 1:20 PM  [facility] at [facility] [address] [place], [address] [place], [place] [address] Phone # [phone] Fax # [phone]

## 2022-03-19 ENCOUNTER — Ambulatory Visit: Payer: 59 | Admitting: Rehabilitative and Restorative Service Providers"

## 2022-03-19 ENCOUNTER — Encounter: Payer: Self-pay | Admitting: Rehabilitative and Restorative Service Providers"

## 2022-03-19 DIAGNOSIS — R29818 Other symptoms and signs involving the nervous system: Secondary | ICD-10-CM

## 2022-03-19 DIAGNOSIS — R2689 Other abnormalities of gait and mobility: Secondary | ICD-10-CM | POA: Diagnosis not present

## 2022-03-19 DIAGNOSIS — R2681 Unsteadiness on feet: Secondary | ICD-10-CM

## 2022-03-19 DIAGNOSIS — M6281 Muscle weakness (generalized): Secondary | ICD-10-CM

## 2022-03-19 NOTE — Therapy (Signed)
OUTPATIENT PHYSICAL THERAPY NEURO TREATMENT NOTE   Patient Name: Tyler Dickerson MRN: [id number] DOB:September 22, 1965, 57 y.o., male Today's Date: 03/19/2022   PCP: Darrow Bussing, MD REFERRING PROVIDER: Vladimir Faster, DO   END OF SESSION:  PT End of Session - 03/19/22 1137     Visit Number 4    Number of Visits 18    Date for PT Re-Evaluation 05/09/22    Authorization Type Receptionist asked pt for new card-as he has not presented a new card as of 03/17/22 visit    PT Start Time 1145    PT Stop Time 1230    PT Time Calculation (min) 45 min    Activity Tolerance Patient tolerated treatment well    Behavior During Therapy [facility] for tasks assessed/performed             Past Medical History:  Diagnosis Date   Atypical chest pain 11/03/2016   Dyslipidemia 11/03/2016   Family history of abdominal aortic aneurysm (AAA) 11/03/2016   Family history of peripheral arterial disease 11/03/2016   History of COVID-19 10/2020   Hypercholesteremia    Mild   Hyperplastic colon polyp    Hypertension    Mild cognitive impairment, concerns for Lewy body disease 02/23/2022   Parkinsonism    REM sleep behavior disorder    Tremor    Past Surgical History:  Procedure Laterality Date   COLONOSCOPY  2015   REPLACEMENT TOTAL KNEE Left 1996   Patient Active Problem List   Diagnosis Date Noted   Hyperplastic colon polyp 02/23/2022   Hypertension 02/23/2022   REM sleep behavior disorder 02/23/2022   Mild cognitive impairment, concerns for Lewy body disease 02/23/2022   Tremor    Parkinsonism    Atypical chest pain 11/03/2016   Dyslipidemia 11/03/2016   Family history of peripheral arterial disease 11/03/2016   Family history of abdominal aortic aneurysm (AAA) 11/03/2016    ONSET DATE: 02/27/2022 (MD referral)  REFERRING DIAG: G20.A1 (ICD-10-CM) - Parkinson's disease without dyskinesia or fluctuating manifestations   THERAPY DIAG:  Other abnormalities of gait and  mobility  Unsteadiness on feet  Muscle weakness (generalized)  Other symptoms and signs involving the nervous system  Rationale for Evaluation and Treatment: Rehabilitation  SUBJECTIVE:                                                                                                                                                                                             SUBJECTIVE STATEMENT: The patient is doing the sitting exercises. He is tolerating exercises well. Pt accompanied by: self  PERTINENT HISTORY: Hx of LBP (saw PT 2023),  HLD, HTN, MCI, Parkinsonism, REM sleep disorder  PAIN:  Are you having pain? Yes: NPRS scale: 0/10 Pain location: back Pain description: none today Aggravating factors: maybe weather Relieving factors: ice/heat, sitting down  PRECAUTIONS: Fall  WEIGHT BEARING RESTRICTIONS: No  FALLS: Has patient fallen in last 6 months? No  LIVING ENVIRONMENT: Lives with: lives with their spouse Lives in: House/apartment Stairs: Yes: External: 3 steps; none Has following equipment at home: None  PLOF: Independent, Vocation/Vocational requirements: lifting up to 85 lbs, and Leisure: enjoys bowling-hasn't done in several years  PATIENT GOALS: To improve moving around better, to get back to bowling.  OBJECTIVE:   [facility] Adult PT Treatment:                                                DATE: 03/19/22 Activity Comments  Quadriped cat/cow for low back Tactile cues and was not able to initiate motion well; needs mod A and movement is limited  Sitting on physiodisc with anterior/posterior pelvic tilt Tactile cues for movement; *able to perform more motion in seated versus quadriped position  NuStep, Level 3, 4 extremities x 5 minutes at end of session for reciprocal movements Cues to keep SPM >70-80 (pt's self-selected pace is 85 today);  Rates effort level 6/10   Reviewed seated PWR! Moves-see below Cues for weight shifting and amplitude of movement  Standing  marching  With alternating UE arm movements; cues for tall posture  Forward step and weightshift, 2 x 5 reps, over obstacles  Used 6" cone and worked on no UE support  Large amplitude stepping with turns With min A and demo + tactile cues  Large amplitude side stepping  Weight shifting and trunk rotation cues  Forward stepping in parallel bars, counting steps  Starts with 5-6, able to take 4 larger steps  Short distance gait 30 ft, 10 reps,  Starts with 14 steps, able to take 11-12   Direction changing gait and gait with horizontal head turns with targets Forwards, stops, backwards, turns 180 degrees  Multi-tasking with functional motions performing squat with 5 lbs>upright>rotation with overhead reaching 5x to each side   Pt performs PWR! Moves in sitting position x 10 reps   PWR! Up for improved posture-cues to open hands and straighten elbows PWR! Rock for improved weighshifting-cues to include hip/leg to increase reach PWR! Twist for improved trunk rotation-cues for brief pause in midline, loud clap  Cues provided for  technique and intensity of movement.  Pt performs PWR! Moves in standing position x 5-10 reps   PWR! Rock for improved weighshifting PWR! Step for improved step initiation   Cues provided for technique and intensity  PATIENT EDUCATION: Education details: HEP-PWR! Moves provided (see pt instructions) Person educated: Patient Education method: Explanation, Demonstration, Tactile cues, Verbal cues, and Handouts Education comprehension: verbalized understanding, returned demonstration, tactile cues required, and needs further education   HOME EXERCISE PROGRAM: PWR moves handout provided at prior session ---------------------------------------------------------------------------------------------------- Objective measures below taken at initial evaluation:  DIAGNOSTIC FINDINGS: DaTscan 01/14/2022:  Bilateral significant decreased radiotracer activity within  the striata. Findings consistent with significant loss of dopamine transport populations. Findings associated with Parkinson's syndrome pathology.  COGNITION: Overall cognitive status:  Pt reports easily fatigued; reports "as you are talking, my mind is all over"   SENSATION: Light touch: WFL  COORDINATION: Slowed overall motion  MUSCLE TONE: LLE: Moderate RLE tone:  minimal increase with P/RoM POSTURE: rounded shoulders and forward head  LOWER EXTREMITY ROM:   WFL except ankle dorsiflexion  Active  Right Eval Left Eval  Ankle dorsiflexion 3 3   LOWER EXTREMITY MMT:    MMT Right Eval Left Eval  Hip flexion 4 4  Hip extension    Knee flexion 4 4  Knee extension 4 4  Ankle dorsiflexion 4 4  Ankle plantarflexion    Ankle inversion    Ankle eversion    (Blank rows = not tested)  TRANSFERS: Assistive device utilized: None  Sit to stand: Modified independence Stand to sit: Modified independence  STAIRS: Level of Assistance: Modified independence Stair Negotiation Technique: Alternating Pattern  with No Rails Number of Stairs: 2-3  Height of Stairs: 4-6"  Comments: Slowed, reports weakness at times with negotiating steps at home.  GAIT: Gait pattern: step through pattern, decreased arm swing- Right, decreased arm swing- Left, decreased step length- Right, decreased step length- Left, decreased ankle dorsiflexion- Right, decreased ankle dorsiflexion- Left, narrow BOS, poor foot clearance- Right, and poor foot clearance- Left Distance walked: 50 ft x 4 reps Assistive device utilized: None Level of assistance: Modified independence Comments: Able to improve foot clearance and step length with cues for BIG intention  FUNCTIONAL TESTS:  5 times sit to stand: 17.68 sec (no UE support) Timed up and go (TUG): 15.56 sec TUG cognitive: 17.13 sec MiniBESTest:  17/28 53M:  14.06 sec = 2.33 ft/sec  GOALS: Goals reviewed with patient? Yes  SHORT TERM GOALS: Target  date: 04/10/2022  Pt will be independent with HEP for improved strength, balance, gait. Baseline: Goal status: ONGOING  2.  Pt will improve 5x sit<>stand to less than or equal to 13 sec to demonstrate improved functional strength and transfer efficiency. Baseline: 17.68 sec Goal status: ONGOING  3.  Pt will improve gait velocity to at least 2.62 ft/sec for improved gait efficiency and safety. Baseline: 2.33 ft/sec Goal status:ONGOING  4.  Pt will verbalize understanding of local Parkinson's disease community resources. Baseline:  Goal status: ONGOING  LONG TERM GOALS: Target date: 05/08/2022  Pt will be independent with HEP for improved strength, balance, gait. Baseline:  Goal status: ONGOING  2.  Pt will improve 5x sit<>stand to less than or equal to 11 sec to demonstrate improved functional strength and transfer efficiency. Baseline: 17.68 sec Goal status:ONGOING  3.  Pt will improve TUG score to less than or equal to 13.5 sec for decreased fall risk.  Baseline: 15.56 sec Goal status: ONGOING  4.  Pt will improve MiniBESTest score to at least 22/28 to decrease fall risk. Baseline: 17/28 Goal status:ONGOING  ASSESSMENT:  CLINICAL IMPRESSION: The patient arrives today with no back pain.  He notes increased foot clearance and less shuffling with gait. He is continuing to participate in sitting PWR! Moves for HEP.  He does have limitations in postural awareness reducing upright posture in standing. Plan to continue to progress to tolerance and continue working on whole body, large amplitude movement patterns.    OBJECTIVE IMPAIRMENTS: Abnormal gait, decreased balance, decreased coordination, decreased mobility, difficulty walking, decreased ROM, decreased strength, impaired flexibility, and postural dysfunction.   PLAN:  PT FREQUENCY: 2x/week  PT DURATION: 8 weeks plus 1x/wk of eval = 9 week POC  PLANNED INTERVENTIONS: Therapeutic exercises, Therapeutic activity,  Neuromuscular re-education, Balance training, Gait training, Patient/Family education, Self Care, Stair training, and DME instructions  PLAN FOR NEXT SESSION: Review  PWR! Moves in sitting and work on standing PWR! Moves.  Work on step through, larger step gait pattern. Gait training with walking poles to facilitate reciprocal arm swing. Squats and floor to stand transfers.  Aerobic activity on NuStep.    [place], PT 03/19/2022, 11:38 AM  [facility] at [facility] [place], [place], [place] [address] Phone # [phone] Fax # [phone]

## 2022-03-24 ENCOUNTER — Encounter: Payer: Self-pay | Admitting: Rehabilitative and Restorative Service Providers"

## 2022-03-24 ENCOUNTER — Ambulatory Visit: Payer: 59 | Admitting: Rehabilitative and Restorative Service Providers"

## 2022-03-24 DIAGNOSIS — R2689 Other abnormalities of gait and mobility: Secondary | ICD-10-CM | POA: Diagnosis not present

## 2022-03-24 DIAGNOSIS — R2681 Unsteadiness on feet: Secondary | ICD-10-CM

## 2022-03-24 DIAGNOSIS — R29818 Other symptoms and signs involving the nervous system: Secondary | ICD-10-CM

## 2022-03-24 DIAGNOSIS — M6281 Muscle weakness (generalized): Secondary | ICD-10-CM

## 2022-03-24 NOTE — Therapy (Signed)
OUTPATIENT PHYSICAL THERAPY NEURO TREATMENT NOTE   Patient Name: Tyler Dickerson MRN: [id number] DOB:1965/08/03, 57 y.o., male Today's Date: 03/24/2022   PCP: Lujean Amel, MD REFERRING PROVIDER: Ludwig Clarks, DO   END OF SESSION:  PT End of Session - 03/24/22 1140     Visit Number 5    Number of Visits 18    Date for PT Re-Evaluation 05/09/22    Authorization Type Receptionist asked pt for new card-as he has not presented a new card as of 03/17/22 visit    PT Start Time 7026    PT Stop Time 61    PT Time Calculation (min) 45 min    Activity Tolerance Patient tolerated treatment well    Behavior During Therapy [facility] for tasks assessed/performed             Past Medical History:  Diagnosis Date   Atypical chest pain 11/03/2016   Dyslipidemia 11/03/2016   Family history of abdominal aortic aneurysm (AAA) 11/03/2016   Family history of peripheral arterial disease 11/03/2016   History of COVID-19 10/2020   Hypercholesteremia    Mild   Hyperplastic colon polyp    Hypertension    Mild cognitive impairment, concerns for Lewy body disease 02/23/2022   Parkinsonism    REM sleep behavior disorder    Tremor    Past Surgical History:  Procedure Laterality Date   COLONOSCOPY  2015   REPLACEMENT TOTAL KNEE Left 1996   Patient Active Problem List   Diagnosis Date Noted   Hyperplastic colon polyp 02/23/2022   Hypertension 02/23/2022   REM sleep behavior disorder 02/23/2022   Mild cognitive impairment, concerns for Lewy body disease 02/23/2022   Tremor    Parkinsonism    Atypical chest pain 11/03/2016   Dyslipidemia 11/03/2016   Family history of peripheral arterial disease 11/03/2016   Family history of abdominal aortic aneurysm (AAA) 11/03/2016    ONSET DATE: 02/27/2022 (MD referral)  REFERRING DIAG: G20.A1 (ICD-10-CM) - Parkinson's disease without dyskinesia or fluctuating manifestations   THERAPY DIAG:  Other abnormalities of gait and  mobility  Unsteadiness on feet  Muscle weakness (generalized)  Other symptoms and signs involving the nervous system  Rationale for Evaluation and Treatment: Rehabilitation  SUBJECTIVE:                                                                                                                                                                                             SUBJECTIVE STATEMENT: The patient notes he is doing better and continuing his home program. Pt accompanied by: self  PERTINENT HISTORY: Hx of LBP (saw PT 2023),  HLD, HTN, MCI, Parkinsonism, REM sleep disorder  PAIN:  Are you having pain? Yes: NPRS scale: 0/10 Pain location: back Pain description: none today Aggravating factors: maybe weather Relieving factors: ice/heat, sitting down  PRECAUTIONS: Fall  WEIGHT BEARING RESTRICTIONS: No  FALLS: Has patient fallen in last 6 months? No  LIVING ENVIRONMENT: Lives with: lives with their spouse Lives in: House/apartment Stairs: Yes: External: 3 steps; none Has following equipment at home: None  PLOF: Independent, Vocation/Vocational requirements: lifting up to 85 lbs, and Leisure: enjoys bowling-hasn't done in several years  PATIENT GOALS: To improve moving around better, to get back to bowling.  OBJECTIVE:    [place] [facility] PT Treatment:                                                DATE: 03/24/22 Activity Comments  Quadriped cat/cow for low back Tactile cues and was not able to initiate motion well; needs mod A and movement is limited  Thread the needle in quadriped With demo and verbal cues for trunk rotation  Forensic psychologist with overpressure   Reviewed seated PWR! Moves-see below Cues for weight shifting and amplitude of movement  Rock and reach with large amplitude motion and weight shifting Near wall for support  Standing PWR! Exercises See below X 10 reps with demo and tactile cues  Short distance gait 30 ft, 10 reps,  Starts with 14  steps, able to take 11-12  Gait with walking poles emphasizing arm swing  Also performed direction changes including backwards walking with SBA and CGA  Trunk rotation tossing bean bags to targets to encourage weight shift and UE motion 10 reps to each side with demo and SBA  Single leg standing x 2 reps 10 seconds R and L sides  Jogging x 30 feet x 2 reps Patient does have arm swing with jogging   Pt performs PWR! Moves in sitting position x 10 reps   PWR! Up for improved posture-cues to open hands and straighten elbows PWR! Rock for improved weighshifting-cues to include hip/leg to increase reach  Cues provided for  technique and intensity of movement.  Pt performs PWR! Moves in standing position x 10 reps x 2 sets PWR! Up for postural initiation and awareness PWR! Twist for weight shifting PWR! Rock for improved weighshifting PWR! Step for improved step initiation   Cues provided for technique and intensity  PATIENT EDUCATION: Education details: HEP-PWR! Moves provided (see pt instructions)-- modified to be in standing Person educated: Patient on 03/24/21 Education method: Explanation, Demonstration, Tactile cues, Verbal cues, and Handouts Education comprehension: verbalized understanding, returned demonstration, tactile cues required, and needs further education   HOME EXERCISE PROGRAM: PWR moves handout provided at prior session ---------------------------------------------------------------------------------------------------- Objective measures below taken at initial evaluation:  DIAGNOSTIC FINDINGS: DaTscan 01/14/2022:  Bilateral significant decreased radiotracer activity within the striata. Findings consistent with significant loss of dopamine transport populations. Findings associated with Parkinson's syndrome pathology.  COGNITION: Overall cognitive status:  Pt reports easily fatigued; reports "as you are talking, my mind is all over"   SENSATION: Light touch:  WFL  COORDINATION: Slowed overall motion  MUSCLE TONE: LLE: Moderate RLE tone:  minimal increase with P/RoM POSTURE: rounded shoulders and forward head  LOWER EXTREMITY ROM:   WFL except ankle dorsiflexion  Active  Right Eval Left Eval  Ankle dorsiflexion 3  3   LOWER EXTREMITY MMT:    MMT Right Eval Left Eval  Hip flexion 4 4  Hip extension    Knee flexion 4 4  Knee extension 4 4  Ankle dorsiflexion 4 4  Ankle plantarflexion    Ankle inversion    Ankle eversion    (Blank rows = not tested)  TRANSFERS: Assistive device utilized: None  Sit to stand: Modified independence Stand to sit: Modified independence  STAIRS: Level of Assistance: Modified independence Stair Negotiation Technique: Alternating Pattern  with No Rails Number of Stairs: 2-3  Height of Stairs: 4-6"  Comments: Slowed, reports weakness at times with negotiating steps at home.  GAIT: Gait pattern: step through pattern, decreased arm swing- Right, decreased arm swing- Left, decreased step length- Right, decreased step length- Left, decreased ankle dorsiflexion- Right, decreased ankle dorsiflexion- Left, narrow BOS, poor foot clearance- Right, and poor foot clearance- Left Distance walked: 50 ft x 4 reps Assistive device utilized: None Level of assistance: Modified independence Comments: Able to improve foot clearance and step length with cues for BIG intention  FUNCTIONAL TESTS:  5 times sit to stand: 17.68 sec (no UE support) Timed up and go (TUG): 15.56 sec TUG cognitive: 17.13 sec MiniBESTest:  17/28 18M:  14.06 sec = 2.33 ft/sec  GOALS: Goals reviewed with patient? Yes  SHORT TERM GOALS: Target date: 04/10/2022  Pt will be independent with HEP for improved strength, balance, gait. Baseline: Goal status: ONGOING  2.  Pt will improve 5x sit<>stand to less than or equal to 13 sec to demonstrate improved functional strength and transfer efficiency. Baseline: 17.68 sec Goal status:  ONGOING  3.  Pt will improve gait velocity to at least 2.62 ft/sec for improved gait efficiency and safety. Baseline: 2.33 ft/sec Goal status:ONGOING  4.  Pt will verbalize understanding of local Parkinson's disease community resources. Baseline:  Goal status: ONGOING  LONG TERM GOALS: Target date: 05/08/2022  Pt will be independent with HEP for improved strength, balance, gait. Baseline:  Goal status: ONGOING  2.  Pt will improve 5x sit<>stand to less than or equal to 11 sec to demonstrate improved functional strength and transfer efficiency. Baseline: 17.68 sec Goal status:ONGOING  3.  Pt will improve TUG score to less than or equal to 13.5 sec for decreased fall risk.  Baseline: 15.56 sec Goal status: ONGOING  4.  Pt will improve MiniBESTest score to at least 22/28 to decrease fall risk. Baseline: 17/28 Goal status:ONGOING  ASSESSMENT:  CLINICAL IMPRESSION: The patient is continuing to note improvements with exercises and reduced LBP. He had short duration soreness after last session that improved after 1 day.  He reports some imbalance with jogging outside with his grandchildren, so PT began to work on this task in therapy.   Modifed HEP to transition to standing.  OBJECTIVE IMPAIRMENTS: Abnormal gait, decreased balance, decreased coordination, decreased mobility, difficulty walking, decreased ROM, decreased strength, impaired flexibility, and postural dysfunction.   PLAN:  PT FREQUENCY: 2x/week  PT DURATION: 8 weeks plus 1x/wk of eval = 9 week POC  PLANNED INTERVENTIONS: Therapeutic exercises, Therapeutic activity, Neuromuscular re-education, Balance training, Gait training, Patient/Family education, Self Care, Stair training, and DME instructions  PLAN FOR NEXT SESSION: Review PWR! Moves in standing.  Work on step through, larger step gait pattern. Gait training with walking poles to facilitate reciprocal arm swing. Squats and floor to stand transfers.  Aerobic activity  on NuStep.    Javarri Segal, PT 03/24/2022, 11:40 AM  [facility] [address], [place], [place] [address] Phone # [phone] Fax # [phone]

## 2022-03-26 ENCOUNTER — Ambulatory Visit: Payer: 59 | Admitting: Physical Therapy

## 2022-03-26 ENCOUNTER — Encounter: Payer: Self-pay | Admitting: Physical Therapy

## 2022-03-26 DIAGNOSIS — M6281 Muscle weakness (generalized): Secondary | ICD-10-CM

## 2022-03-26 DIAGNOSIS — R2689 Other abnormalities of gait and mobility: Secondary | ICD-10-CM | POA: Diagnosis not present

## 2022-03-26 DIAGNOSIS — R2681 Unsteadiness on feet: Secondary | ICD-10-CM

## 2022-03-26 NOTE — Therapy (Signed)
OUTPATIENT PHYSICAL THERAPY NEURO TREATMENT NOTE   Patient Name: Tyler Dickerson MRN: [id number] DOB:July 05, 1965, 57 y.o., male Today's Date: 03/27/2022   PCP: Lujean Amel, MD REFERRING PROVIDER: Ludwig Clarks, DO   END OF SESSION:  PT End of Session - 03/26/22 1156     Visit Number 6    Number of Visits 18    Date for PT Re-Evaluation 05/09/22    Authorization Type Receptionist asked pt for new card-as he has not presented a new card as of 03/17/22 visit    PT Start Time 0093    PT Stop Time 1232    PT Time Calculation (min) 38 min    Activity Tolerance Patient tolerated treatment well    Behavior During Therapy [facility] for tasks assessed/performed             Past Medical History:  Diagnosis Date   Atypical chest pain 11/03/2016   Dyslipidemia 11/03/2016   Family history of abdominal aortic aneurysm (AAA) 11/03/2016   Family history of peripheral arterial disease 11/03/2016   History of COVID-19 10/2020   Hypercholesteremia    Mild   Hyperplastic colon polyp    Hypertension    Mild cognitive impairment, concerns for Lewy body disease 02/23/2022   Parkinsonism    REM sleep behavior disorder    Tremor    Past Surgical History:  Procedure Laterality Date   COLONOSCOPY  2015   REPLACEMENT TOTAL KNEE Left 1996   Patient Active Problem List   Diagnosis Date Noted   Hyperplastic colon polyp 02/23/2022   Hypertension 02/23/2022   REM sleep behavior disorder 02/23/2022   Mild cognitive impairment, concerns for Lewy body disease 02/23/2022   Tremor    Parkinsonism    Atypical chest pain 11/03/2016   Dyslipidemia 11/03/2016   Family history of peripheral arterial disease 11/03/2016   Family history of abdominal aortic aneurysm (AAA) 11/03/2016    ONSET DATE: 02/27/2022 (MD referral)  REFERRING DIAG: G20.A1 (ICD-10-CM) - Parkinson's disease without dyskinesia or fluctuating manifestations   THERAPY DIAG:  Other abnormalities of gait and  mobility  Unsteadiness on feet  Muscle weakness (generalized)  Rationale for Evaluation and Treatment: Rehabilitation  SUBJECTIVE:                                                                                                                                                                                             SUBJECTIVE STATEMENT: The patient notes he is doing better and continuing his home program. Pt accompanied by: self  PERTINENT HISTORY: Hx of LBP (saw PT 2023), HLD, HTN, MCI, Parkinsonism, REM sleep disorder  PAIN:  Are you having pain? Yes: NPRS scale: 0/10 Pain location: back Pain description: none today Aggravating factors: maybe weather Relieving factors: ice/heat, sitting down  PRECAUTIONS: Fall  WEIGHT BEARING RESTRICTIONS: No  FALLS: Has patient fallen in last 6 months? No  LIVING ENVIRONMENT: Lives with: lives with their spouse Lives in: House/apartment Stairs: Yes: External: 3 steps; none Has following equipment at home: None  PLOF: Independent, Vocation/Vocational requirements: lifting up to 85 lbs, and Leisure: enjoys bowling-hasn't done in several years  PATIENT GOALS: To improve moving around better, to get back to bowling.  OBJECTIVE:    TODAY'S TREATMENT: 03/26/2022 Activity Comments  Sit<>stand 2 x 5 reps Cues for upright posture upon standing  Modified quadruped at counter: -PWR! Up x 10 reps -PWR! Rock ant/posterior x 10 -PWR! Twist, thread the needle x 10 Cues for technique and large movement patterns  Standing PWR! Moves, performed x 10 reps See below  Stagger stance forward/back rocking x 15 reps Cues for equal/even arm swing forward and back (1 UE support at outside stair rail)  Short distance forward/back gait 30 ft, 5 reps Cues for longer strides in forward direction; starts at 15 steps, able to get to 12 steps  Gait training with cues to focus on stride length, posture, arm swing, 50 ft x 6 reps Continued decrease bilat arm  swing  Short distance gait with simulating bowling, weighted balls (yellow and red), rolling to cones Pt with narrowed BOS and forward posture, improves with repetition.      Pt performs PWR! Moves in standing position x 10 reps PWR! Up for postural initiation and awareness PWR! Twist for weight shifting PWR! Rock for improved weighshifting PWR! Step for improved step initiation   Cues provided for technique and intensity  PATIENT EDUCATION: Education details: HEP additions-PWR! Moves in standing Person educated: Patient -03/27/2022 Education method: Explanation, Demonstration, Tactile cues, Verbal cues, and Handouts Education comprehension: verbalized understanding, returned demonstration, tactile cues required, and needs further education   HOME EXERCISE PROGRAM: PWR moves handout provided at prior session ---------------------------------------------------------------------------------------------------- Objective measures below taken at initial evaluation:  DIAGNOSTIC FINDINGS: DaTscan 01/14/2022:  Bilateral significant decreased radiotracer activity within the striata. Findings consistent with significant loss of dopamine transport populations. Findings associated with Parkinson's syndrome pathology.  COGNITION: Overall cognitive status:  Pt reports easily fatigued; reports "as you are talking, my mind is all over"   SENSATION: Light touch: WFL  COORDINATION: Slowed overall motion  MUSCLE TONE: LLE: Moderate RLE tone:  minimal increase with P/RoM POSTURE: rounded shoulders and forward head  LOWER EXTREMITY ROM:   WFL except ankle dorsiflexion  Active  Right Eval Left Eval  Ankle dorsiflexion 3 3   LOWER EXTREMITY MMT:    MMT Right Eval Left Eval  Hip flexion 4 4  Hip extension    Knee flexion 4 4  Knee extension 4 4  Ankle dorsiflexion 4 4  Ankle plantarflexion    Ankle inversion    Ankle eversion    (Blank rows = not tested)  TRANSFERS: Assistive  device utilized: None  Sit to stand: Modified independence Stand to sit: Modified independence  STAIRS: Level of Assistance: Modified independence Stair Negotiation Technique: Alternating Pattern  with No Rails Number of Stairs: 2-3  Height of Stairs: 4-6"  Comments: Slowed, reports weakness at times with negotiating steps at home.  GAIT: Gait pattern: step through pattern, decreased arm swing- Right, decreased arm swing- Left, decreased step length- Right, decreased step length- Left, decreased ankle dorsiflexion- Right,  decreased ankle dorsiflexion- Left, narrow BOS, poor foot clearance- Right, and poor foot clearance- Left Distance walked: 50 ft x 4 reps Assistive device utilized: None Level of assistance: Modified independence Comments: Able to improve foot clearance and step length with cues for BIG intention  FUNCTIONAL TESTS:  5 times sit to stand: 17.68 sec (no UE support) Timed up and go (TUG): 15.56 sec TUG cognitive: 17.13 sec MiniBESTest:  17/28 78M:  14.06 sec = 2.33 ft/sec  GOALS: Goals reviewed with patient? Yes  SHORT TERM GOALS: Target date: 04/10/2022  Pt will be independent with HEP for improved strength, balance, gait. Baseline: Goal status: ONGOING  2.  Pt will improve 5x sit<>stand to less than or equal to 13 sec to demonstrate improved functional strength and transfer efficiency. Baseline: 17.68 sec Goal status: ONGOING  3.  Pt will improve gait velocity to at least 2.62 ft/sec for improved gait efficiency and safety. Baseline: 2.33 ft/sec Goal status:ONGOING  4.  Pt will verbalize understanding of local Parkinson's disease community resources. Baseline:  Goal status: ONGOING  LONG TERM GOALS: Target date: 05/08/2022  Pt will be independent with HEP for improved strength, balance, gait. Baseline:  Goal status: ONGOING  2.  Pt will improve 5x sit<>stand to less than or equal to 11 sec to demonstrate improved functional strength and transfer  efficiency. Baseline: 17.68 sec Goal status:ONGOING  3.  Pt will improve TUG score to less than or equal to 13.5 sec for decreased fall risk.  Baseline: 15.56 sec Goal status: ONGOING  4.  Pt will improve MiniBESTest score to at least 22/28 to decrease fall risk. Baseline: 17/28 Goal status:ONGOING  ASSESSMENT:  CLINICAL IMPRESSION: Skilled PT session today focused on large amplitude movement patterns in varied positions-modified quadruped at counter and standing PWR! Moves.  Added and reviewed standing PWR! Moves for HEP.  Pt does well with demo and tactile cues, and needs additional time and repetition to continue performing motions at large amplitude once cues removed.  Simulated bowling task today, as pt wants to get back to bowling sometime in the future.  He does have forward flexed posture and narrowed BOS with this, but no overt LOB.  He will continue to benefit from skilled PT towards goals for improved overall functional mobility.  OBJECTIVE IMPAIRMENTS: Abnormal gait, decreased balance, decreased coordination, decreased mobility, difficulty walking, decreased ROM, decreased strength, impaired flexibility, and postural dysfunction.   PLAN:  PT FREQUENCY: 2x/week  PT DURATION: 8 weeks plus 1x/wk of eval = 9 week POC  PLANNED INTERVENTIONS: Therapeutic exercises, Therapeutic activity, Neuromuscular re-education, Balance training, Gait training, Patient/Family education, Self Care, Stair training, and DME instructions  PLAN FOR NEXT SESSION: Review PWR! Moves in standing.  Work on step through, larger step gait pattern. Gait training with walking poles to facilitate reciprocal arm swing (if weather is nice, may try outside). Squats and floor to stand transfers.  Aerobic activity on NuStep.    Frazier Butt., PT 03/27/2022, 11:42 AM   Outpatient Rehab at [facility] [place], [place], [place] [address] Phone # [phone] Fax # [phone]

## 2022-03-27 ENCOUNTER — Ambulatory Visit
Admission: RE | Admit: 2022-03-27 | Discharge: 2022-03-27 | Disposition: A | Discharge status: Home / Self Care | Payer: 59 | Source: Ambulatory Visit | Attending: Neurology | Admitting: Neurology

## 2022-03-27 DIAGNOSIS — M545 Low back pain, unspecified: Secondary | ICD-10-CM

## 2022-03-27 DIAGNOSIS — R292 Abnormal reflex: Secondary | ICD-10-CM

## 2022-03-27 DIAGNOSIS — R2681 Unsteadiness on feet: Secondary | ICD-10-CM

## 2022-03-27 DIAGNOSIS — G20A1 Parkinson's disease without dyskinesia, without mention of fluctuations: Secondary | ICD-10-CM

## 2022-03-31 ENCOUNTER — Ambulatory Visit: Payer: 59 | Admitting: Rehabilitative and Restorative Service Providers"

## 2022-03-31 ENCOUNTER — Encounter: Payer: Self-pay | Admitting: Rehabilitative and Restorative Service Providers"

## 2022-03-31 DIAGNOSIS — R2689 Other abnormalities of gait and mobility: Secondary | ICD-10-CM

## 2022-03-31 DIAGNOSIS — R2681 Unsteadiness on feet: Secondary | ICD-10-CM

## 2022-03-31 DIAGNOSIS — M6281 Muscle weakness (generalized): Secondary | ICD-10-CM

## 2022-03-31 DIAGNOSIS — R29818 Other symptoms and signs involving the nervous system: Secondary | ICD-10-CM

## 2022-03-31 NOTE — Therapy (Signed)
OUTPATIENT PHYSICAL THERAPY NEURO TREATMENT NOTE   Patient Name: Tyler Dickerson MRN: [id number] DOB:1965-11-23, 57 y.o., male Today's Date: 03/31/2022   PCP: Darrow Bussing, MD REFERRING PROVIDER: Vladimir Faster, DO   END OF SESSION:  PT End of Session - 03/31/22 1134     Visit Number 7    Number of Visits 18    Date for PT Re-Evaluation 05/09/22    Authorization Type Receptionist asked pt for new card-as he has not presented a new card as of 03/17/22 visit    PT Start Time 1140    PT Stop Time 1228    PT Time Calculation (min) 48 min    Activity Tolerance Patient tolerated treatment well    Behavior During Therapy [facility] for tasks assessed/performed             Past Medical History:  Diagnosis Date   Atypical chest pain 11/03/2016   Dyslipidemia 11/03/2016   Family history of abdominal aortic aneurysm (AAA) 11/03/2016   Family history of peripheral arterial disease 11/03/2016   History of COVID-19 10/2020   Hypercholesteremia    Mild   Hyperplastic colon polyp    Hypertension    Mild cognitive impairment, concerns for Lewy body disease 02/23/2022   Parkinsonism    REM sleep behavior disorder    Tremor    Past Surgical History:  Procedure Laterality Date   COLONOSCOPY  2015   REPLACEMENT TOTAL KNEE Left 1996   Patient Active Problem List   Diagnosis Date Noted   Hyperplastic colon polyp 02/23/2022   Hypertension 02/23/2022   REM sleep behavior disorder 02/23/2022   Mild cognitive impairment, concerns for Lewy body disease 02/23/2022   Tremor    Parkinsonism    Atypical chest pain 11/03/2016   Dyslipidemia 11/03/2016   Family history of peripheral arterial disease 11/03/2016   Family history of abdominal aortic aneurysm (AAA) 11/03/2016    ONSET DATE: 02/27/2022 (MD referral)  REFERRING DIAG: G20.A1 (ICD-10-CM) - Parkinson's disease without dyskinesia or fluctuating manifestations   THERAPY DIAG:  Other abnormalities of gait and  mobility  Unsteadiness on feet  Muscle weakness (generalized)  Other symptoms and signs involving the nervous system  Rationale for Evaluation and Treatment: Rehabilitation  SUBJECTIVE:                                                                                                                                                                                             SUBJECTIVE STATEMENT: The patient notes some low back discomfort for a day after last session and then it resolved. He also reports that he had discomfort yesterday  while at work, but none today.  Pt accompanied by: self  PERTINENT HISTORY: Hx of LBP (saw PT 2023), HLD, HTN, MCI, Parkinsonism, REM sleep disorder  PAIN:  Are you having pain? Yes: NPRS scale: 0/10 Pain location: back Pain description: none today Aggravating factors: maybe weather Relieving factors: ice/heat, sitting down  PRECAUTIONS: Fall  WEIGHT BEARING RESTRICTIONS: No  FALLS: Has patient fallen in last 6 months? No  LIVING ENVIRONMENT: Lives with: lives with their spouse Lives in: House/apartment Stairs: Yes: External: 3 steps; none Has following equipment at home: None  PLOF: Independent, Vocation/Vocational requirements: lifting up to 85 lbs, and Leisure: enjoys bowling-hasn't done in several years  PATIENT GOALS: To improve moving around better, to get back to bowling.  OBJECTIVE:    TODAY'S TREATMENT: 03/26/2022 Activity Comments  Nu-step x 4 minutes  Bilat Ues/Les level 6-- stopped early due to burning in L quad  Prone quad stretch With contract relax AROM and PROM  Prone alternating UE reaches Emphasizing large amplitude movement and postural upright  Prone press up for LB and quad stretch One leg off mat x 3 reps x 20 second holds  Supine trunk Rotation from hooklying R and L with arms in T  Supine brich With feet on physioball x 5 reps with cues for larger movement  Supine bridge With LE extension x 10 reps with cues   PWR! Transitions prone to quadriped Working on trunk mobility, large amplitude movement  Diagonal large amplitude stepping Blue theraband  Marching  Blue theraband  Sidesteping  Blue theraband  PWR! standing Weight shifting anterior/posterior  PWR! Standing  Lateral weight shifting with wide leg marching  Treadmill Backwards walking 0.3-0.5 mph with bilat UE support x 1 minute Forwards walking x 1.0 mph up to 3% incline x 2 minutes with verbal and demo cues for heel strike and longer stride  Gait Monster walking with and without blue theraband working on squat + wide leg movement + loud stomping x 20 feet x 3 reps  Gait with emphasis on fast speed to encourage natural arm swing x 50 feet x 2  Jogging x 50 feet x 3 reps    PATIENT EDUCATION: Education details: HEP additions-PWR! Moves in standing Person educated: Patient -03/27/2022 Education method: Explanation, Demonstration, Tactile cues, Verbal cues, and Handouts Education comprehension: verbalized understanding, returned demonstration, tactile cues required, and needs further education   HOME EXERCISE PROGRAM: Continue prior PWR standing activities. ---------------------------------------------------------------------------------------------------- Objective measures below taken at initial evaluation:  DIAGNOSTIC FINDINGS: DaTscan 01/14/2022:  Bilateral significant decreased radiotracer activity within the striata. Findings consistent with significant loss of dopamine transport populations. Findings associated with Parkinson's syndrome pathology.  COGNITION: Overall cognitive status:  Pt reports easily fatigued; reports "as you are talking, my mind is all over"   SENSATION: Light touch: WFL  COORDINATION: Slowed overall motion  MUSCLE TONE: LLE: Moderate RLE tone:  minimal increase with P/RoM POSTURE: rounded shoulders and forward head  LOWER EXTREMITY ROM:   WFL except ankle dorsiflexion  Active  Right Eval  Left Eval  Ankle dorsiflexion 3 3   LOWER EXTREMITY MMT:    MMT Right Eval Left Eval  Hip flexion 4 4  Hip extension    Knee flexion 4 4  Knee extension 4 4  Ankle dorsiflexion 4 4  Ankle plantarflexion    Ankle inversion    Ankle eversion    (Blank rows = not tested)  TRANSFERS: Assistive device utilized: None  Sit to stand: Modified independence Stand  to sit: Modified independence  STAIRS: Level of Assistance: Modified independence Stair Negotiation Technique: Alternating Pattern  with No Rails Number of Stairs: 2-3  Height of Stairs: 4-6"  Comments: Slowed, reports weakness at times with negotiating steps at home.  GAIT: Gait pattern: step through pattern, decreased arm swing- Right, decreased arm swing- Left, decreased step length- Right, decreased step length- Left, decreased ankle dorsiflexion- Right, decreased ankle dorsiflexion- Left, narrow BOS, poor foot clearance- Right, and poor foot clearance- Left Distance walked: 50 ft x 4 reps Assistive device utilized: None Level of assistance: Modified independence Comments: Able to improve foot clearance and step length with cues for BIG intention  FUNCTIONAL TESTS:  5 times sit to stand: 17.68 sec (no UE support) Timed up and go (TUG): 15.56 sec TUG cognitive: 17.13 sec MiniBESTest:  17/28 26M:  14.06 sec = 2.33 ft/sec  GOALS: Goals reviewed with patient? Yes  SHORT TERM GOALS: Target date: 04/10/2022  Pt will be independent with HEP for improved strength, balance, gait. Baseline: Goal status: ONGOING  2.  Pt will improve 5x sit<>stand to less than or equal to 13 sec to demonstrate improved functional strength and transfer efficiency. Baseline: 17.68 sec Goal status: ONGOING  3.  Pt will improve gait velocity to at least 2.62 ft/sec for improved gait efficiency and safety. Baseline: 2.33 ft/sec Goal status:ONGOING  4.  Pt will verbalize understanding of local Parkinson's disease community  resources. Baseline:  Goal status: ONGOING  LONG TERM GOALS: Target date: 05/08/2022  Pt will be independent with HEP for improved strength, balance, gait. Baseline:  Goal status: ONGOING  2.  Pt will improve 5x sit<>stand to less than or equal to 11 sec to demonstrate improved functional strength and transfer efficiency. Baseline: 17.68 sec Goal status:ONGOING  3.  Pt will improve TUG score to less than or equal to 13.5 sec for decreased fall risk.  Baseline: 15.56 sec Goal status: ONGOING  4.  Pt will improve MiniBESTest score to at least 22/28 to decrease fall risk. Baseline: 17/28 Goal status:ONGOING  ASSESSMENT:  CLINICAL IMPRESSION: Patient noted tightness and burning in L thigh with warm up. PT emphasized hip flexor lengthening, upright posture, and glut activation to help with postural support. Pt does well with demo and tactile cues, and needs additional time and repetition to continue performing motions at large amplitude once cues removed.  The patient leaves therapy with more upright posture and no pain. He notes exercises improve back mobility. PT to continue to progress working towards STGs/LTGS.     OBJECTIVE IMPAIRMENTS: Abnormal gait, decreased balance, decreased coordination, decreased mobility, difficulty walking, decreased ROM, decreased strength, impaired flexibility, and postural dysfunction.   PLAN:  PT FREQUENCY: 2x/week  PT DURATION: 8 weeks plus 1x/wk of eval = 9 week POC  PLANNED INTERVENTIONS: Therapeutic exercises, Therapeutic activity, Neuromuscular re-education, Balance training, Gait training, Patient/Family education, Self Care, Stair training, and DME instructions  PLAN FOR NEXT SESSION: Review PWR! Moves in standing.  Work on step through, larger step gait pattern. Gait training with walking poles to facilitate reciprocal arm swing (if weather is nice, may try outside). Squats and floor to stand transfers.  Aerobic activity on NuStep.     Baker, PT 03/31/2022, 12:38 PM  [facility] at [facility] [place], [place], [place] [address] Phone # [phone] Fax # [phone]

## 2022-04-02 ENCOUNTER — Encounter: Payer: Self-pay | Admitting: Physical Therapy

## 2022-04-02 ENCOUNTER — Ambulatory Visit: Payer: 59 | Admitting: Physical Therapy

## 2022-04-02 DIAGNOSIS — R29818 Other symptoms and signs involving the nervous system: Secondary | ICD-10-CM

## 2022-04-02 DIAGNOSIS — R2689 Other abnormalities of gait and mobility: Secondary | ICD-10-CM | POA: Diagnosis not present

## 2022-04-02 DIAGNOSIS — R2681 Unsteadiness on feet: Secondary | ICD-10-CM

## 2022-04-02 DIAGNOSIS — M6281 Muscle weakness (generalized): Secondary | ICD-10-CM

## 2022-04-02 NOTE — Therapy (Signed)
OUTPATIENT PHYSICAL THERAPY NEURO TREATMENT NOTE   Patient Name: Tyler Dickerson MRN: [id number] DOB:01/07/1966, 57 y.o., male Today's Date: 04/02/2022   PCP: Lujean Amel, MD REFERRING PROVIDER: Ludwig Clarks, DO   END OF SESSION:  PT End of Session - 04/02/22 1151     Visit Number 8    Number of Visits 18    Date for PT Re-Evaluation 05/09/22    Authorization Type Receptionist asked pt for new card-as he has not presented a new card as of 03/17/22 visit    PT Start Time 1151    PT Stop Time 7425    PT Time Calculation (min) 40 min    Activity Tolerance Patient tolerated treatment well    Behavior During Therapy [facility] for tasks assessed/performed              Past Medical History:  Diagnosis Date   Atypical chest pain 11/03/2016   Dyslipidemia 11/03/2016   Family history of abdominal aortic aneurysm (AAA) 11/03/2016   Family history of peripheral arterial disease 11/03/2016   History of COVID-19 10/2020   Hypercholesteremia    Mild   Hyperplastic colon polyp    Hypertension    Mild cognitive impairment, concerns for Lewy body disease 02/23/2022   Parkinsonism    REM sleep behavior disorder    Tremor    Past Surgical History:  Procedure Laterality Date   COLONOSCOPY  2015   REPLACEMENT TOTAL KNEE Left 1996   Patient Active Problem List   Diagnosis Date Noted   Hyperplastic colon polyp 02/23/2022   Hypertension 02/23/2022   REM sleep behavior disorder 02/23/2022   Mild cognitive impairment, concerns for Lewy body disease 02/23/2022   Tremor    Parkinsonism    Atypical chest pain 11/03/2016   Dyslipidemia 11/03/2016   Family history of peripheral arterial disease 11/03/2016   Family history of abdominal aortic aneurysm (AAA) 11/03/2016    ONSET DATE: 02/27/2022 (MD referral)  REFERRING DIAG: G20.A1 (ICD-10-CM) - Parkinson's disease without dyskinesia or fluctuating manifestations   THERAPY DIAG:  Other abnormalities of gait and  mobility  Unsteadiness on feet  Muscle weakness (generalized)  Other symptoms and signs involving the nervous system  Rationale for Evaluation and Treatment: Rehabilitation  SUBJECTIVE:                                                                                                                                                                                             SUBJECTIVE STATEMENT: A little stiff in my back,maybe due to the weather.  Overall, back is not hurting as much, and when it does, it doesn't  last as long. Pt accompanied by: self  PERTINENT HISTORY: Hx of LBP (saw PT 2023), HLD, HTN, MCI, Parkinsonism, REM sleep disorder  PAIN:  Are you having pain? Yes: NPRS scale: 3/10 Pain location: back Pain description: none today Aggravating factors: maybe weather Relieving factors: ice/heat, sitting down  PRECAUTIONS: Fall  WEIGHT BEARING RESTRICTIONS: No  FALLS: Has patient fallen in last 6 months? No  LIVING ENVIRONMENT: Lives with: lives with their spouse Lives in: House/apartment Stairs: Yes: External: 3 steps; none Has following equipment at home: None  PLOF: Independent, Vocation/Vocational requirements: lifting up to 85 lbs, and Leisure: enjoys bowling-hasn't done in several years  PATIENT GOALS: To improve moving around better, to get back to bowling.  OBJECTIVE:    TODAY'S TREATMENT: 04/02/2022 Activity Comments  NuStep, Level 4/5, 2 minutes intervals, SPM  80-90, 10 minutes total Rates effort level as 7-8/10.  Cues for patient to use this effort rating scale for his use of stationary bike at home  Quadruped PWR! Moves: -PWR! Up x 10 reps for posture -PWR! Rock x 8 reps for flexibility through lumbar spine and hips -PWR! Twist x 5 reps each side for trunk flexibility Cues for technique and intensity, good form and no c/o back pain  Sit to stand with upright posture x 10 reps; squats holding 10# weights, 2 x 10 reps Cues for technique for squats   Intervals of dynamic gait/agility activities, 2-3 min each: -forward/back walking with emphasis on arm swing, foot clearance -forward marching>progressing to forward march/opposite knee taps -Sidestepping with coordinated arm motions, with addition of BoomWhackers and cues for large movements to make noise with BoomWhackers Smaller movement patterns with added conversation or dual task portions of movement      PATIENT EDUCATION: Education details: Opportunities for [employer]! Moves community classes; continued to reinforce rationale/benefits of increased intensity of exercises, activities in therapy session Person educated: Patient Education method: Explanation Education comprehension: verbalized understanding      HOME EXERCISE PROGRAM: Continue prior PWR standing activities. ---------------------------------------------------------------------------------------------------- Objective measures below taken at initial evaluation:  DIAGNOSTIC FINDINGS: DaTscan 01/14/2022:  Bilateral significant decreased radiotracer activity within the striata. Findings consistent with significant loss of dopamine transport populations. Findings associated with Parkinson's syndrome pathology.  COGNITION: Overall cognitive status:  Pt reports easily fatigued; reports "as you are talking, my mind is all over"   SENSATION: Light touch: WFL  COORDINATION: Slowed overall motion  MUSCLE TONE: LLE: Moderate RLE tone:  minimal increase with P/RoM POSTURE: rounded shoulders and forward head  LOWER EXTREMITY ROM:   WFL except ankle dorsiflexion  Active  Right Eval Left Eval  Ankle dorsiflexion 3 3   LOWER EXTREMITY MMT:    MMT Right Eval Left Eval  Hip flexion 4 4  Hip extension    Knee flexion 4 4  Knee extension 4 4  Ankle dorsiflexion 4 4  Ankle plantarflexion    Ankle inversion    Ankle eversion    (Blank rows = not tested)  TRANSFERS: Assistive device utilized: None  Sit to stand:  Modified independence Stand to sit: Modified independence  STAIRS: Level of Assistance: Modified independence Stair Negotiation Technique: Alternating Pattern  with No Rails Number of Stairs: 2-3  Height of Stairs: 4-6"  Comments: Slowed, reports weakness at times with negotiating steps at home.  GAIT: Gait pattern: step through pattern, decreased arm swing- Right, decreased arm swing- Left, decreased step length- Right, decreased step length- Left, decreased ankle dorsiflexion- Right, decreased ankle dorsiflexion- Left, narrow BOS,  poor foot clearance- Right, and poor foot clearance- Left Distance walked: 50 ft x 4 reps Assistive device utilized: None Level of assistance: Modified independence Comments: Able to improve foot clearance and step length with cues for BIG intention  FUNCTIONAL TESTS:  5 times sit to stand: 17.68 sec (no UE support) Timed up and go (TUG): 15.56 sec TUG cognitive: 17.13 sec MiniBESTest:  17/28 78M:  14.06 sec = 2.33 ft/sec  GOALS: Goals reviewed with patient? Yes  SHORT TERM GOALS: Target date: 04/10/2022  Pt will be independent with HEP for improved strength, balance, gait. Baseline: Goal status: ONGOING  2.  Pt will improve 5x sit<>stand to less than or equal to 13 sec to demonstrate improved functional strength and transfer efficiency. Baseline: 17.68 sec Goal status: ONGOING  3.  Pt will improve gait velocity to at least 2.62 ft/sec for improved gait efficiency and safety. Baseline: 2.33 ft/sec Goal status:ONGOING  4.  Pt will verbalize understanding of local Parkinson's disease community resources. Baseline:  Goal status: ONGOING  LONG TERM GOALS: Target date: 05/08/2022  Pt will be independent with HEP for improved strength, balance, gait. Baseline:  Goal status: ONGOING  2.  Pt will improve 5x sit<>stand to less than or equal to 11 sec to demonstrate improved functional strength and transfer efficiency. Baseline: 17.68 sec Goal  status:ONGOING  3.  Pt will improve TUG score to less than or equal to 13.5 sec for decreased fall risk.  Baseline: 15.56 sec Goal status: ONGOING  4.  Pt will improve MiniBESTest score to at least 22/28 to decrease fall risk. Baseline: 17/28 Goal status:ONGOING  ASSESSMENT:  CLINICAL IMPRESSION: Skilled PT session today continues to focus on large amplitude, whole body, higher intensity movement patterns for functional strength, balance/agility, and gait.  Pt does well with trial of PWR! Moves in quadruped and initiated discussion on trying PWR! Moves in community classes; he is open to further discussion.  With dynamic gait and balance activities, pt is initially able to achieve larger amplitude of motion, but with added conversation or dual activity task, noted slower, smaller movement patterns.  He reports fatigue at end of session, but no pain and notes that overall back pain is improving.  He will continue to benefit from skilled PT to address balance, posture, strength and flexibility and gait for overall improved functional mobility.  OBJECTIVE IMPAIRMENTS: Abnormal gait, decreased balance, decreased coordination, decreased mobility, difficulty walking, decreased ROM, decreased strength, impaired flexibility, and postural dysfunction.   PLAN:  PT FREQUENCY: 2x/week  PT DURATION: 8 weeks plus 1x/wk of eval = 9 week POC  PLANNED INTERVENTIONS: Therapeutic exercises, Therapeutic activity, Neuromuscular re-education, Balance training, Gait training, Patient/Family education, Self Care, Stair training, and DME instructions  PLAN FOR NEXT SESSION: Check STGs.  PWR! Moves-may try quadruped position.    Work on step through, larger step gait pattern. Gait training with walking poles to facilitate reciprocal arm swing (if weather is nice, may try outside). Squats and floor to stand transfers.  Educate on community resources for PD.  Gean Maidens., PT 04/02/2022, 2:04 PM  [facility] at [facility] [address] [place], [address] [place], [place] [address] Phone # [phone] Fax # [phone]

## 2022-04-07 ENCOUNTER — Ambulatory Visit: Payer: 59 | Admitting: Physical Therapy

## 2022-04-07 DIAGNOSIS — R2681 Unsteadiness on feet: Secondary | ICD-10-CM

## 2022-04-07 DIAGNOSIS — R2689 Other abnormalities of gait and mobility: Secondary | ICD-10-CM | POA: Diagnosis not present

## 2022-04-07 DIAGNOSIS — M6281 Muscle weakness (generalized): Secondary | ICD-10-CM

## 2022-04-07 DIAGNOSIS — R29818 Other symptoms and signs involving the nervous system: Secondary | ICD-10-CM

## 2022-04-07 NOTE — Therapy (Signed)
OUTPATIENT PHYSICAL THERAPY NEURO TREATMENT NOTE   Patient Name: Tyler Dickerson MRN: [id number] DOB:1965-09-18, 57 y.o., male Today's Date: 04/07/2022   PCP: Lujean Amel, MD REFERRING PROVIDER: Ludwig Clarks, DO   END OF SESSION:  PT End of Session - 04/07/22 1112     Visit Number 9    Number of Visits 18    Date for PT Re-Evaluation 05/09/22    Authorization Type UHC 2024; no VL    PT Start Time 1106    PT Stop Time 1147    PT Time Calculation (min) 41 min    Activity Tolerance Patient tolerated treatment well    Behavior During Therapy [facility] for tasks assessed/performed              Past Medical History:  Diagnosis Date   Atypical chest pain 11/03/2016   Dyslipidemia 11/03/2016   Family history of abdominal aortic aneurysm (AAA) 11/03/2016   Family history of peripheral arterial disease 11/03/2016   History of COVID-19 10/2020   Hypercholesteremia    Mild   Hyperplastic colon polyp    Hypertension    Mild cognitive impairment, concerns for Lewy body disease 02/23/2022   Parkinsonism    REM sleep behavior disorder    Tremor    Past Surgical History:  Procedure Laterality Date   COLONOSCOPY  2015   REPLACEMENT TOTAL KNEE Left 1996   Patient Active Problem List   Diagnosis Date Noted   Hyperplastic colon polyp 02/23/2022   Hypertension 02/23/2022   REM sleep behavior disorder 02/23/2022   Mild cognitive impairment, concerns for Lewy body disease 02/23/2022   Tremor    Parkinsonism    Atypical chest pain 11/03/2016   Dyslipidemia 11/03/2016   Family history of peripheral arterial disease 11/03/2016   Family history of abdominal aortic aneurysm (AAA) 11/03/2016    ONSET DATE: 02/27/2022 (MD referral)  REFERRING DIAG: G20.A1 (ICD-10-CM) - Parkinson's disease without dyskinesia or fluctuating manifestations   THERAPY DIAG:  Other abnormalities of gait and mobility  Unsteadiness on feet  Muscle weakness (generalized)  Other symptoms and  signs involving the nervous system  Rationale for Evaluation and Treatment: Rehabilitation  SUBJECTIVE:                                                                                                                                                                                             SUBJECTIVE STATEMENT: Back is typical stiffness today.  Did exercises during halftime of the game Sunday. Pt accompanied by: self  PERTINENT HISTORY: Hx of LBP (saw PT 2023), HLD, HTN, MCI, Parkinsonism, REM sleep disorder  PAIN:  Are you having pain? Yes: NPRS scale: 5/10 Pain location: back Pain description: none today Aggravating factors: maybe weather Relieving factors: ice/heat, sitting down  PRECAUTIONS: Fall  WEIGHT BEARING RESTRICTIONS: No  FALLS: Has patient fallen in last 6 months? No  LIVING ENVIRONMENT: Lives with: lives with their spouse Lives in: House/apartment Stairs: Yes: External: 3 steps; none Has following equipment at home: None  PLOF: Independent, Vocation/Vocational requirements: lifting up to 85 lbs, and Leisure: enjoys bowling-hasn't done in several years  PATIENT GOALS: To improve moving around better, to get back to bowling.  OBJECTIVE:    TODAY'S TREATMENT: 04/07/2022 Activity Comments  Recumbent bike, 5 minutes, 4 extremities>BLES, Level 1 for warmup and flexibility Initial RPM 27, able to increase and stay at >50 RPM for intensity of movement  5x sit<>stand: 14.94 sec, then 13.66 sec, no UE support, then 12.19 sec  Improved from 17.68 sec  Gait velocity:  8.84 sec, then 8.57 sec over 32.8 ft 3.82 ft/sec, improved from 2.33 ft/sec  Forward/back step and weigthshift x 5 reps then with added arm swing, 10 reps each side Cues for increased step and weightshift in posterior direction  Heel/toe raises 10 reps      PWR! Moves in sitting and standing, review of HEP -Pt performs 10-20 reps of each position, with verbal, visual cues for technique and for optimal  intensity/amplitude of movement.  Used pictures on computer screen today; pt reports he uses his pictures at counter at home.  Access Code: [email or web address] URL: [email or web address] Date: 04/07/2022 Prepared by: [facility]  Exercises - Sit to Stand with Hands on Knees  - 1 x daily - 7 x weekly - 3 sets - 5 reps - Step Forward with Opposite Arm Reach  - 1 x daily - 5 x weekly - 1-2 sets - 10 reps -PWR! Moves in sitting, 2 x 10 reps -PWR! Moves in standing, 2 x 10 reps  PATIENT EDUCATION: Education details: HEP additions, progress towards goals/POC; reiterated importance of larger amplitude, higher intensity/effort level of exercises, functional activities Person educated: Patient Education method: Explanation, Demonstration, Verbal cues, and Handouts Education comprehension: verbalized understanding, returned demonstration, verbal cues required, and needs further education   TODAY'S TREATMENT: 04/02/2022 Activity Comments  NuStep, Level 4/5, 2 minutes intervals, SPM  80-90, 10 minutes total Rates effort level as 7-8/10.  Cues for patient to use this effort rating scale for his use of stationary bike at home  Quadruped PWR! Moves: -PWR! Up x 10 reps for posture -PWR! Rock x 8 reps for flexibility through lumbar spine and hips -PWR! Twist x 5 reps each side for trunk flexibility Cues for technique and intensity, good form and no c/o back pain  Sit to stand with upright posture x 10 reps; squats holding 10# weights, 2 x 10 reps Cues for technique for squats  Intervals of dynamic gait/agility activities, 2-3 min each: -forward/back walking with emphasis on arm swing, foot clearance -forward marching>progressing to forward march/opposite knee taps -Sidestepping with coordinated arm motions, with addition of BoomWhackers and cues for large movements to make noise with BoomWhackers Smaller movement patterns with added conversation or dual task  portions of movement      PATIENT EDUCATION: Education details: Opportunities for [employer]! Moves community classes; continued to reinforce rationale/benefits of increased intensity of exercises, activities in therapy session Person educated: Patient Education method: Explanation Education comprehension: verbalized understanding      HOME EXERCISE PROGRAM: Continue prior PWR standing  activities. ---------------------------------------------------------------------------------------------------- Objective measures below taken at initial evaluation:  DIAGNOSTIC FINDINGS: DaTscan 01/14/2022:  Bilateral significant decreased radiotracer activity within the striata. Findings consistent with significant loss of dopamine transport populations. Findings associated with Parkinson's syndrome pathology.  COGNITION: Overall cognitive status:  Pt reports easily fatigued; reports "as you are talking, my mind is all over"   SENSATION: Light touch: WFL  COORDINATION: Slowed overall motion  MUSCLE TONE: LLE: Moderate RLE tone:  minimal increase with P/RoM POSTURE: rounded shoulders and forward head  LOWER EXTREMITY ROM:   WFL except ankle dorsiflexion  Active  Right Eval Left Eval  Ankle dorsiflexion 3 3   LOWER EXTREMITY MMT:    MMT Right Eval Left Eval  Hip flexion 4 4  Hip extension    Knee flexion 4 4  Knee extension 4 4  Ankle dorsiflexion 4 4  Ankle plantarflexion    Ankle inversion    Ankle eversion    (Blank rows = not tested)  TRANSFERS: Assistive device utilized: None  Sit to stand: Modified independence Stand to sit: Modified independence  STAIRS: Level of Assistance: Modified independence Stair Negotiation Technique: Alternating Pattern  with No Rails Number of Stairs: 2-3  Height of Stairs: 4-6"  Comments: Slowed, reports weakness at times with negotiating steps at home.  GAIT: Gait pattern: step through pattern, decreased arm swing- Right, decreased arm  swing- Left, decreased step length- Right, decreased step length- Left, decreased ankle dorsiflexion- Right, decreased ankle dorsiflexion- Left, narrow BOS, poor foot clearance- Right, and poor foot clearance- Left Distance walked: 50 ft x 4 reps Assistive device utilized: None Level of assistance: Modified independence Comments: Able to improve foot clearance and step length with cues for BIG intention  FUNCTIONAL TESTS:  5 times sit to stand: 17.68 sec (no UE support) Timed up and go (TUG): 15.56 sec TUG cognitive: 17.13 sec MiniBESTest:  17/28 92M:  14.06 sec = 2.33 ft/sec  GOALS: Goals reviewed with patient? Yes  SHORT TERM GOALS: Target date: 04/10/2022  Pt will be independent with HEP for improved strength, balance, gait. Baseline:  requires cues  Goal status: PARTIALLY MET, 04/07/2022  2.  Pt will improve 5x sit<>stand to less than or equal to 13 sec to demonstrate improved functional strength and transfer efficiency. Baseline: 17.68 sec>12.19 sec at best Goal status: GOAL MET, 04/07/2022  3.  Pt will improve gait velocity to at least 2.62 ft/sec for improved gait efficiency and safety. Baseline: 2.33 ft/sec>3.82 ft/sec Goal status: GOAL MET, 04/07/2022  4.  Pt will verbalize understanding of local Parkinson's disease community resources. Baseline:  Goal status: ONGOING  LONG TERM GOALS: Target date: 05/08/2022  Pt will be independent with HEP for improved strength, balance, gait. Baseline:  Goal status: ONGOING  2.  Pt will improve 5x sit<>stand to less than or equal to 11 sec to demonstrate improved functional strength and transfer efficiency. Baseline: 17.68 sec Goal status:ONGOING  3.  Pt will improve TUG score to less than or equal to 13.5 sec for decreased fall risk.  Baseline: 15.56 sec Goal status: ONGOING  4.  Pt will improve MiniBESTest score to at least 22/28 to decrease fall risk. Baseline: 17/28 Goal status:ONGOING  ASSESSMENT:  CLINICAL  IMPRESSION: Assessed STGs today, with pt meeting STG 2 and 3 for improved 5x sit<>stand and for improved gait velocity.  Pt has partially met STG 1 for HEP, at pt is performing at home, but he requires cues for technique, intensity and larger amplitude of movements.  Pt responds well to cueing throughout session for larger amplitude movement patterns, but he continues to revert slightly back to smaller movements once cues removed.  Of note, pt does consistently report back pain is better following therapy sessions, and PT reinforces that pt continue exercises, and even perform some throughout the day to help lessen trunk and back stiffness.  He will continue to benefit from skilled PT towards remaining STGs and LTGs for overall improved functional mobility, decreased fall risk.  OBJECTIVE IMPAIRMENTS: Abnormal gait, decreased balance, decreased coordination, decreased mobility, difficulty walking, decreased ROM, decreased strength, impaired flexibility, and postural dysfunction.   PLAN:  PT FREQUENCY: 2x/week  PT DURATION: 8 weeks plus 1x/wk of eval = 9 week POC  PLANNED INTERVENTIONS: Therapeutic exercises, Therapeutic activity, Neuromuscular re-education, Balance training, Gait training, Patient/Family education, Self Care, Stair training, and DME instructions  PLAN FOR NEXT SESSION: Provide community PD information and check STG 4.  Review updates to HEP. PWR! Moves-may try quadruped position.    Work on step through, larger step gait pattern. Gait training with walking poles to facilitate reciprocal arm swing (if weather is nice, may try outside). Squats and floor to stand transfers.  Educate on community resources for PD.  Gean Maidens., PT 04/07/2022, 11:54 AM  [facility] at [facility] [address] [place], [address] [place], [place] [address] Phone # [phone] Fax # [phone]

## 2022-04-09 ENCOUNTER — Ambulatory Visit: Payer: 59 | Attending: Neurology | Admitting: Physical Therapy

## 2022-04-09 ENCOUNTER — Telehealth: Payer: Self-pay | Admitting: Anesthesiology

## 2022-04-09 ENCOUNTER — Other Ambulatory Visit: Payer: Self-pay | Admitting: Family Medicine

## 2022-04-09 ENCOUNTER — Encounter: Payer: Self-pay | Admitting: Physical Therapy

## 2022-04-09 ENCOUNTER — Ambulatory Visit
Admission: RE | Admit: 2022-04-09 | Discharge: 2022-04-09 | Disposition: A | Discharge status: Home / Self Care | Payer: 59 | Source: Ambulatory Visit | Attending: Family Medicine | Admitting: Family Medicine

## 2022-04-09 DIAGNOSIS — R29818 Other symptoms and signs involving the nervous system: Secondary | ICD-10-CM | POA: Insufficient documentation

## 2022-04-09 DIAGNOSIS — R2681 Unsteadiness on feet: Secondary | ICD-10-CM | POA: Insufficient documentation

## 2022-04-09 DIAGNOSIS — W19XXXA Unspecified fall, initial encounter: Secondary | ICD-10-CM

## 2022-04-09 DIAGNOSIS — M6281 Muscle weakness (generalized): Secondary | ICD-10-CM | POA: Insufficient documentation

## 2022-04-09 DIAGNOSIS — R2689 Other abnormalities of gait and mobility: Secondary | ICD-10-CM | POA: Insufficient documentation

## 2022-04-09 NOTE — Telephone Encounter (Signed)
Pt's wife called stating she would like for Tyler Dickerson to see pt before 04/27/22. Pt is falling more, he fell and injured his lip, his night time behavior has gotten worse,he has gotten in bed with granddaughter and he is making messes in the bathroom. Pt's wife requests call back.

## 2022-04-09 NOTE — Therapy (Deleted)
OUTPATIENT PHYSICAL THERAPY NEURO TREATMENT NOTE   Patient Name: Tyler Dickerson MRN: [id number] DOB:May 07, 1965, 57 y.o., male Today's Date: 04/09/2022   PCP: Lujean Amel, MD REFERRING PROVIDER: Ludwig Clarks, DO   END OF SESSION:  PT End of Session - 04/09/22 1140     Visit Number 10    Number of Visits 18    Date for PT Re-Evaluation 05/09/22    Authorization Type UHC 2024; no VL    PT Start Time 9622    Activity Tolerance Patient tolerated treatment well    Behavior During Therapy [facility] for tasks assessed/performed              Past Medical History:  Diagnosis Date   Atypical chest pain 11/03/2016   Dyslipidemia 11/03/2016   Family history of abdominal aortic aneurysm (AAA) 11/03/2016   Family history of peripheral arterial disease 11/03/2016   History of COVID-19 10/2020   Hypercholesteremia    Mild   Hyperplastic colon polyp    Hypertension    Mild cognitive impairment, concerns for Lewy body disease 02/23/2022   Parkinsonism    REM sleep behavior disorder    Tremor    Past Surgical History:  Procedure Laterality Date   COLONOSCOPY  2015   REPLACEMENT TOTAL KNEE Left 1996   Patient Active Problem List   Diagnosis Date Noted   Hyperplastic colon polyp 02/23/2022   Hypertension 02/23/2022   REM sleep behavior disorder 02/23/2022   Mild cognitive impairment, concerns for Lewy body disease 02/23/2022   Tremor    Parkinsonism    Atypical chest pain 11/03/2016   Dyslipidemia 11/03/2016   Family history of peripheral arterial disease 11/03/2016   Family history of abdominal aortic aneurysm (AAA) 11/03/2016    ONSET DATE: 02/27/2022 (MD referral)  REFERRING DIAG: G20.A1 (ICD-10-CM) - Parkinson's disease without dyskinesia or fluctuating manifestations   THERAPY DIAG:  Other abnormalities of gait and mobility  Unsteadiness on feet  Muscle weakness (generalized)  Other symptoms and signs involving the nervous system  Rationale for Evaluation  and Treatment: Rehabilitation  SUBJECTIVE:                                                                                                                                                                                             SUBJECTIVE STATEMENT: Not doing too good today.  [place] coming back from the bathroom in the middle of the night.  The first time that it happened.  Hit my face, lip, chest. Pt accompanied by: self  PERTINENT HISTORY: Hx of LBP (saw PT 2023), HLD, HTN, MCI, Parkinsonism, REM sleep disorder  PAIN:  Are you having pain? Yes: NPRS scale: 6-7/10 Pain location: back Pain description: none today Aggravating factors: maybe weather Relieving factors: ice/heat, sitting down  PRECAUTIONS: Fall  WEIGHT BEARING RESTRICTIONS: No  FALLS: Has patient fallen in last 6 months? No  LIVING ENVIRONMENT: Lives with: lives with their spouse Lives in: House/apartment Stairs: Yes: External: 3 steps; none Has following equipment at home: None  PLOF: Independent, Vocation/Vocational requirements: lifting up to 85 lbs, and Leisure: enjoys bowling-hasn't done in several years  PATIENT GOALS: To improve moving around better, to get back to bowling.  OBJECTIVE:    TODAY'S TREATMENT: 04/09/2022 Activity Comments                      TODAY'S TREATMENT: 04/07/2022 Activity Comments  Recumbent bike, 5 minutes, 4 extremities>BLES, Level 1 for warmup and flexibility Initial RPM 27, able to increase and stay at >50 RPM for intensity of movement  5x sit<>stand: 14.94 sec, then 13.66 sec, no UE support, then 12.19 sec  Improved from 17.68 sec  Gait velocity:  8.84 sec, then 8.57 sec over 32.8 ft 3.82 ft/sec, improved from 2.33 ft/sec  Forward/back step and weigthshift x 5 reps then with added arm swing, 10 reps each side Cues for increased step and weightshift in posterior direction  Heel/toe raises 10 reps      PWR! Moves in sitting and standing, review of HEP -Pt performs  10-20 reps of each position, with verbal, visual cues for technique and for optimal intensity/amplitude of movement.  Used pictures on computer screen today; pt reports he uses his pictures at counter at home.  Access Code: [email or web address] URL: [email or web address] Date: 04/07/2022 Prepared by: [facility]  Exercises - Sit to Stand with Hands on Knees  - 1 x daily - 7 x weekly - 3 sets - 5 reps - Step Forward with Opposite Arm Reach  - 1 x daily - 5 x weekly - 1-2 sets - 10 reps -PWR! Moves in sitting, 2 x 10 reps -PWR! Moves in standing, 2 x 10 reps  PATIENT EDUCATION: Education details: HEP additions, progress towards goals/POC; reiterated importance of larger amplitude, higher intensity/effort level of exercises, functional activities Person educated: Patient Education method: Explanation, Demonstration, Verbal cues, and Handouts Education comprehension: verbalized understanding, returned demonstration, verbal cues required, and needs further education   TODAY'S TREATMENT: 04/02/2022 Activity Comments  NuStep, Level 4/5, 2 minutes intervals, SPM  80-90, 10 minutes total Rates effort level as 7-8/10.  Cues for patient to use this effort rating scale for his use of stationary bike at home  Quadruped PWR! Moves: -PWR! Up x 10 reps for posture -PWR! Rock x 8 reps for flexibility through lumbar spine and hips -PWR! Twist x 5 reps each side for trunk flexibility Cues for technique and intensity, good form and no c/o back pain  Sit to stand with upright posture x 10 reps; squats holding 10# weights, 2 x 10 reps Cues for technique for squats  Intervals of dynamic gait/agility activities, 2-3 min each: -forward/back walking with emphasis on arm swing, foot clearance -forward marching>progressing to forward march/opposite knee taps -Sidestepping with coordinated arm motions, with addition of BoomWhackers and cues for large movements to make noise  with BoomWhackers Smaller movement patterns with added conversation or dual task portions of movement      PATIENT EDUCATION: Education details: Opportunities for [employer]! Moves community classes; continued to reinforce rationale/benefits of increased intensity of exercises,  activities in therapy session Person educated: Patient Education method: Explanation Education comprehension: verbalized understanding      HOME EXERCISE PROGRAM: Continue prior PWR standing activities. ---------------------------------------------------------------------------------------------------- Objective measures below taken at initial evaluation:  DIAGNOSTIC FINDINGS: DaTscan 01/14/2022:  Bilateral significant decreased radiotracer activity within the striata. Findings consistent with significant loss of dopamine transport populations. Findings associated with Parkinson's syndrome pathology.  COGNITION: Overall cognitive status:  Pt reports easily fatigued; reports "as you are talking, my mind is all over"   SENSATION: Light touch: WFL  COORDINATION: Slowed overall motion  MUSCLE TONE: LLE: Moderate RLE tone:  minimal increase with P/RoM POSTURE: rounded shoulders and forward head  LOWER EXTREMITY ROM:   WFL except ankle dorsiflexion  Active  Right Eval Left Eval  Ankle dorsiflexion 3 3   LOWER EXTREMITY MMT:    MMT Right Eval Left Eval  Hip flexion 4 4  Hip extension    Knee flexion 4 4  Knee extension 4 4  Ankle dorsiflexion 4 4  Ankle plantarflexion    Ankle inversion    Ankle eversion    (Blank rows = not tested)  TRANSFERS: Assistive device utilized: None  Sit to stand: Modified independence Stand to sit: Modified independence  STAIRS: Level of Assistance: Modified independence Stair Negotiation Technique: Alternating Pattern  with No Rails Number of Stairs: 2-3  Height of Stairs: 4-6"  Comments: Slowed, reports weakness at times with negotiating steps at  home.  GAIT: Gait pattern: step through pattern, decreased arm swing- Right, decreased arm swing- Left, decreased step length- Right, decreased step length- Left, decreased ankle dorsiflexion- Right, decreased ankle dorsiflexion- Left, narrow BOS, poor foot clearance- Right, and poor foot clearance- Left Distance walked: 50 ft x 4 reps Assistive device utilized: None Level of assistance: Modified independence Comments: Able to improve foot clearance and step length with cues for BIG intention  FUNCTIONAL TESTS:  5 times sit to stand: 17.68 sec (no UE support) Timed up and go (TUG): 15.56 sec TUG cognitive: 17.13 sec MiniBESTest:  17/28 82M:  14.06 sec = 2.33 ft/sec  GOALS: Goals reviewed with patient? Yes  SHORT TERM GOALS: Target date: 04/10/2022  Pt will be independent with HEP for improved strength, balance, gait. Baseline:  requires cues  Goal status: PARTIALLY MET, 04/07/2022  2.  Pt will improve 5x sit<>stand to less than or equal to 13 sec to demonstrate improved functional strength and transfer efficiency. Baseline: 17.68 sec>12.19 sec at best Goal status: GOAL MET, 04/07/2022  3.  Pt will improve gait velocity to at least 2.62 ft/sec for improved gait efficiency and safety. Baseline: 2.33 ft/sec>3.82 ft/sec Goal status: GOAL MET, 04/07/2022  4.  Pt will verbalize understanding of local Parkinson's disease community resources. Baseline:  Goal status: ONGOING  LONG TERM GOALS: Target date: 05/08/2022  Pt will be independent with HEP for improved strength, balance, gait. Baseline:  Goal status: ONGOING  2.  Pt will improve 5x sit<>stand to less than or equal to 11 sec to demonstrate improved functional strength and transfer efficiency. Baseline: 17.68 sec Goal status:ONGOING  3.  Pt will improve TUG score to less than or equal to 13.5 sec for decreased fall risk.  Baseline: 15.56 sec Goal status: ONGOING  4.  Pt will improve MiniBESTest score to at least 22/28 to  decrease fall risk. Baseline: 17/28 Goal status:ONGOING  ASSESSMENT:  CLINICAL IMPRESSION: ***Assessed STGs today, with pt meeting STG 2 and 3 for improved 5x sit<>stand and for improved gait velocity.  Pt  has partially met STG 1 for HEP, at pt is performing at home, but he requires cues for technique, intensity and larger amplitude of movements.  Pt responds well to cueing throughout session for larger amplitude movement patterns, but he continues to revert slightly back to smaller movements once cues removed.  Of note, pt does consistently report back pain is better following therapy sessions, and PT reinforces that pt continue exercises, and even perform some throughout the day to help lessen trunk and back stiffness.  He will continue to benefit from skilled PT towards remaining STGs and LTGs for overall improved functional mobility, decreased fall risk.  OBJECTIVE IMPAIRMENTS: Abnormal gait, decreased balance, decreased coordination, decreased mobility, difficulty walking, decreased ROM, decreased strength, impaired flexibility, and postural dysfunction.   PLAN:  PT FREQUENCY: 2x/week  PT DURATION: 8 weeks plus 1x/wk of eval = 9 week POC  PLANNED INTERVENTIONS: Therapeutic exercises, Therapeutic activity, Neuromuscular re-education, Balance training, Gait training, Patient/Family education, Self Care, Stair training, and DME instructions  PLAN FOR NEXT SESSION: ***Provide community PD information and check STG 4.  Review updates to HEP. PWR! Moves-may try quadruped position.    Work on step through, larger step gait pattern. Gait training with walking poles to facilitate reciprocal arm swing (if weather is nice, may try outside). Squats and floor to stand transfers.  Educate on community resources for PD.  Frazier Butt., PT 04/09/2022, 11:46 AM  [facility] at [facility] [place], [place], [place] [address] Phone # [phone] Fax # [phone]

## 2022-04-09 NOTE — Telephone Encounter (Signed)
Advised to contact PCP or ED, actually patient just seen PCP, undergoing Testing now.

## 2022-04-09 NOTE — Therapy (Signed)
[facility] [address], [place], [place], [address] Phone: [phone]   Fax:  [phone]  Patient Details  Name: Tyler Dickerson MRN: [id number] Date of Birth: Jul 08, 1965 Referring Provider:  Lujean Amel, MD  Encounter Date: 04/09/2022  Pt arrives to PT session today, reporting not doing well.  Initially he reports falling out of bed while sleeping (either last night or the night before, he cannot say), and hit his face, lip, and chest.  He reports 6-7/10 pain in chest/L lateral rib area, which worsens with deep breaths and has obvious pain with sit>stand transfers.  Upon discussion about fall, pt reports he got up in the middle of the night to use the bathroom and never made it back to the bed, so unsure if fall was during walking or during sleep.    No charge for time today.  PT advised pt to follow up with PCP (wife has already placed call there per chart) or Urgent Care/ED due to hitting face and chest pain.  Pt verbalizes understanding.  Deloss Amico W., PT 04/09/2022, 12:09 PM  [facility] [address] [place], [place], [address] Phone: [phone]   Fax:  [phone]

## 2022-04-14 ENCOUNTER — Ambulatory Visit: Payer: 59 | Admitting: Physical Therapy

## 2022-04-14 ENCOUNTER — Encounter: Payer: Self-pay | Admitting: Physical Therapy

## 2022-04-14 DIAGNOSIS — M6281 Muscle weakness (generalized): Secondary | ICD-10-CM | POA: Diagnosis present

## 2022-04-14 DIAGNOSIS — R2689 Other abnormalities of gait and mobility: Secondary | ICD-10-CM

## 2022-04-14 DIAGNOSIS — R2681 Unsteadiness on feet: Secondary | ICD-10-CM

## 2022-04-14 DIAGNOSIS — R29818 Other symptoms and signs involving the nervous system: Secondary | ICD-10-CM | POA: Diagnosis present

## 2022-04-14 NOTE — Therapy (Signed)
OUTPATIENT PHYSICAL THERAPY NEURO TREATMENT NOTE   Patient Name: Tyler Dickerson MRN: [id number] DOB:10/10/1965, 57 y.o., male Today's Date: 04/14/2022   PCP: Lujean Amel, MD REFERRING PROVIDER: Ludwig Clarks, DO   END OF SESSION:  PT End of Session - 04/14/22 1232     Visit Number 10    Number of Visits 18    Date for PT Re-Evaluation 05/09/22    Authorization Type UHC 2024; no VL    PT Start Time 1234    PT Stop Time 1314    PT Time Calculation (min) 40 min    Activity Tolerance Patient tolerated treatment well    Behavior During Therapy [facility] for tasks assessed/performed              Past Medical History:  Diagnosis Date   Atypical chest pain 11/03/2016   Dyslipidemia 11/03/2016   Family history of abdominal aortic aneurysm (AAA) 11/03/2016   Family history of peripheral arterial disease 11/03/2016   History of COVID-19 10/2020   Hypercholesteremia    Mild   Hyperplastic colon polyp    Hypertension    Mild cognitive impairment, concerns for Lewy body disease 02/23/2022   Parkinsonism    REM sleep behavior disorder    Tremor    Past Surgical History:  Procedure Laterality Date   COLONOSCOPY  2015   REPLACEMENT TOTAL KNEE Left 1996   Patient Active Problem List   Diagnosis Date Noted   Hyperplastic colon polyp 02/23/2022   Hypertension 02/23/2022   REM sleep behavior disorder 02/23/2022   Mild cognitive impairment, concerns for Lewy body disease 02/23/2022   Tremor    Parkinsonism    Atypical chest pain 11/03/2016   Dyslipidemia 11/03/2016   Family history of peripheral arterial disease 11/03/2016   Family history of abdominal aortic aneurysm (AAA) 11/03/2016    ONSET DATE: 02/27/2022 (MD referral)  REFERRING DIAG: G20.A1 (ICD-10-CM) - Parkinson's disease without dyskinesia or fluctuating manifestations   THERAPY DIAG:  Other abnormalities of gait and mobility  Unsteadiness on feet  Muscle weakness (generalized)  Other symptoms and  signs involving the nervous system  Rationale for Evaluation and Treatment: Rehabilitation  SUBJECTIVE:                                                                                                                                                                                             SUBJECTIVE STATEMENT: Some pain in chest area when I do certain activities, x-rays were negative.  No additional falls. Pt accompanied by: self  PERTINENT HISTORY: Hx of LBP (saw PT 2023), HLD, HTN, MCI, Parkinsonism, REM sleep disorder  PAIN:  Are you having pain? No  PRECAUTIONS: Fall  WEIGHT BEARING RESTRICTIONS: No  FALLS: Has patient fallen in last 6 months? No  LIVING ENVIRONMENT: Lives with: lives with their spouse Lives in: House/apartment Stairs: Yes: External: 3 steps; none Has following equipment at home: None  PLOF: Independent, Vocation/Vocational requirements: lifting up to 85 lbs, and Leisure: enjoys bowling-hasn't done in several years  PATIENT GOALS: To improve moving around better, to get back to bowling.  OBJECTIVE:    TODAY'S TREATMENT: 04/14/2022 Activity Comments  Provided information on community Parkinson's resources See education below  Sit<>stand 2 x 5 reps    Standing lumbar stretch, 2 x 5 reps   Standing PWR! Moves, 10 reps -PWR! UP -PWR! [place]! Twist (modified reaching across body at doorway) -PWR! Step-side Attempted trunk rotation, but causes pain, so did not proceed  Sidestepping>resisted sidestepping, green band, 4 reps BUE support at counter.  Cues for posture  Forward/back monster walk with green theraband, 2 reps with tactile cues   Forward step and weightshift over low hurdle, x 10 reps, then 2nd set with alternating legs Cues to avoid circumduction       Access Code: [email or web address] URL: [email or web address] Date: 04/07/2022 Prepared by: [facility]  Exercises - Sit to Stand with Hands on  Knees  - 1 x daily - 7 x weekly - 3 sets - 5 reps - Step Forward with Opposite Arm Reach  - 1 x daily - 5 x weekly - 1-2 sets - 10 reps -PWR! Moves in sitting, 2 x 10 reps -PWR! Moves in standing, 2 x 10 reps  PATIENT EDUCATION: Education details: Review of standing PWR! Moves (hold on Twist for now, due to pain in chest, trunk area); continue focus on best posture, arm swing with gait; provided community resource information to patient Person educated: Patient Education method: Consulting civil engineer, Demonstration, Verbal cues, and Handouts Education comprehension: verbalized understanding, returned demonstration, verbal cues required, and needs further education       ---------------------------------------------------------------------------------------------------- Objective measures below taken at initial evaluation:  DIAGNOSTIC FINDINGS: DaTscan 01/14/2022:  Bilateral significant decreased radiotracer activity within the striata. Findings consistent with significant loss of dopamine transport populations. Findings associated with Parkinson's syndrome pathology.  COGNITION: Overall cognitive status:  Pt reports easily fatigued; reports "as you are talking, my mind is all over"   SENSATION: Light touch: WFL  COORDINATION: Slowed overall motion  MUSCLE TONE: LLE: Moderate RLE tone:  minimal increase with P/RoM POSTURE: rounded shoulders and forward head  LOWER EXTREMITY ROM:   WFL except ankle dorsiflexion  Active  Right Eval Left Eval  Ankle dorsiflexion 3 3   LOWER EXTREMITY MMT:    MMT Right Eval Left Eval  Hip flexion 4 4  Hip extension    Knee flexion 4 4  Knee extension 4 4  Ankle dorsiflexion 4 4  Ankle plantarflexion    Ankle inversion    Ankle eversion    (Blank rows = not tested)  TRANSFERS: Assistive device utilized: None  Sit to stand: Modified independence Stand to sit: Modified independence  STAIRS: Level of Assistance: Modified  independence Stair Negotiation Technique: Alternating Pattern  with No Rails Number of Stairs: 2-3  Height of Stairs: 4-6"  Comments: Slowed, reports weakness at times with negotiating steps at home.  GAIT: Gait pattern: step through pattern, decreased arm swing- Right, decreased arm swing- Left, decreased step length- Right, decreased step length- Left, decreased ankle dorsiflexion- Right,  decreased ankle dorsiflexion- Left, narrow BOS, poor foot clearance- Right, and poor foot clearance- Left Distance walked: 50 ft x 4 reps Assistive device utilized: None Level of assistance: Modified independence Comments: Able to improve foot clearance and step length with cues for BIG intention  FUNCTIONAL TESTS:  5 times sit to stand: 17.68 sec (no UE support) Timed up and go (TUG): 15.56 sec TUG cognitive: 17.13 sec MiniBESTest:  17/28 11M:  14.06 sec = 2.33 ft/sec  GOALS: Goals reviewed with patient? Yes  SHORT TERM GOALS: Target date: 04/10/2022  Pt will be independent with HEP for improved strength, balance, gait. Baseline:  requires cues  Goal status: PARTIALLY MET, 04/07/2022  2.  Pt will improve 5x sit<>stand to less than or equal to 13 sec to demonstrate improved functional strength and transfer efficiency. Baseline: 17.68 sec>12.19 sec at best Goal status: GOAL MET, 04/07/2022  3.  Pt will improve gait velocity to at least 2.62 ft/sec for improved gait efficiency and safety. Baseline: 2.33 ft/sec>3.82 ft/sec Goal status: GOAL MET, 04/07/2022  4.  Pt will verbalize understanding of local Parkinson's disease community resources. Baseline: info provided 04/14/2022 Goal status: GOAL MET, 04/14/2022  LONG TERM GOALS: Target date: 05/08/2022  Pt will be independent with HEP for improved strength, balance, gait. Baseline:  Goal status: ONGOING  2.  Pt will improve 5x sit<>stand to less than or equal to 11 sec to demonstrate improved functional strength and transfer efficiency. Baseline:  17.68 sec Goal status:ONGOING  3.  Pt will improve TUG score to less than or equal to 13.5 sec for decreased fall risk.  Baseline: 15.56 sec Goal status: ONGOING  4.  Pt will improve MiniBESTest score to at least 22/28 to decrease fall risk. Baseline: 17/28 Goal status:ONGOING  ASSESSMENT:  CLINICAL IMPRESSION: Pt returns to [place] today following fall last week, reporting he feels better and had x-rays taken of chest, which were negative.  He reports occasional pain in L chest area when reaching.  During session, pain is brought on most by reaching overhead with PWR! [place] and with reaching across body with PWR! Twist.  Advised patient to perform these motions to tolerance to avoid pain, but to continue attention to best posture, arm swing and step length as he is able.  Provided community PD resource information in session today to address STG 4.  He will continue to benefit from skilled PT towards LTGs for overall improved functional mobility, decreased fall risk.  OBJECTIVE IMPAIRMENTS: Abnormal gait, decreased balance, decreased coordination, decreased mobility, difficulty walking, decreased ROM, decreased strength, impaired flexibility, and postural dysfunction.   PLAN:  PT FREQUENCY: 2x/week  PT DURATION: 8 weeks plus 1x/wk of eval = 9 week POC  PLANNED INTERVENTIONS: Therapeutic exercises, Therapeutic activity, Neuromuscular re-education, Balance training, Gait training, Patient/Family education, Self Care, Stair training, and DME instructions  PLAN FOR NEXT SESSION:   Review updates to HEP. PWR! Moves-may try quadruped position.    Work on step through, larger step gait pattern. Gait training with walking poles to facilitate reciprocal arm swing (if weather is nice, may try outside). Squats and floor to stand transfers.    Frazier Butt., PT 04/14/2022, 1:15 PM  [facility], [place], [place] [address] Phone #  [phone] Fax # [phone]

## 2022-04-16 ENCOUNTER — Ambulatory Visit: Payer: 59 | Admitting: Rehabilitative and Restorative Service Providers"

## 2022-04-16 ENCOUNTER — Encounter: Payer: Self-pay | Admitting: Rehabilitative and Restorative Service Providers"

## 2022-04-16 DIAGNOSIS — R2681 Unsteadiness on feet: Secondary | ICD-10-CM

## 2022-04-16 DIAGNOSIS — R2689 Other abnormalities of gait and mobility: Secondary | ICD-10-CM | POA: Diagnosis not present

## 2022-04-16 DIAGNOSIS — R29818 Other symptoms and signs involving the nervous system: Secondary | ICD-10-CM

## 2022-04-16 DIAGNOSIS — M6281 Muscle weakness (generalized): Secondary | ICD-10-CM

## 2022-04-16 NOTE — Therapy (Signed)
OUTPATIENT PHYSICAL THERAPY NEURO TREATMENT NOTE   Patient Name: Tyler Dickerson MRN: [id number] DOB:01/08/1966, 57 y.o., male Today's Date: 04/16/2022   PCP: Lujean Amel, MD REFERRING PROVIDER: Ludwig Clarks, DO   END OF SESSION:  PT End of Session - 04/16/22 1145     Visit Number 11    Number of Visits 18    Date for PT Re-Evaluation 05/09/22    Authorization Type UHC 2024; no VL    PT Start Time 6063    PT Stop Time 1230    PT Time Calculation (min) 45 min    Activity Tolerance Patient tolerated treatment well    Behavior During Therapy [facility] for tasks assessed/performed              Past Medical History:  Diagnosis Date   Atypical chest pain 11/03/2016   Dyslipidemia 11/03/2016   Family history of abdominal aortic aneurysm (AAA) 11/03/2016   Family history of peripheral arterial disease 11/03/2016   History of COVID-19 10/2020   Hypercholesteremia    Mild   Hyperplastic colon polyp    Hypertension    Mild cognitive impairment, concerns for Lewy body disease 02/23/2022   Parkinsonism    REM sleep behavior disorder    Tremor    Past Surgical History:  Procedure Laterality Date   COLONOSCOPY  2015   REPLACEMENT TOTAL KNEE Left 1996   Patient Active Problem List   Diagnosis Date Noted   Hyperplastic colon polyp 02/23/2022   Hypertension 02/23/2022   REM sleep behavior disorder 02/23/2022   Mild cognitive impairment, concerns for Lewy body disease 02/23/2022   Tremor    Parkinsonism    Atypical chest pain 11/03/2016   Dyslipidemia 11/03/2016   Family history of peripheral arterial disease 11/03/2016   Family history of abdominal aortic aneurysm (AAA) 11/03/2016    ONSET DATE: 02/27/2022 (MD referral)  REFERRING DIAG: G20.A1 (ICD-10-CM) - Parkinson's disease without dyskinesia or fluctuating manifestations   THERAPY DIAG:  Other abnormalities of gait and mobility  Unsteadiness on feet  Muscle weakness (generalized)  Other symptoms and  signs involving the nervous system  Rationale for Evaluation and Treatment: Rehabilitation  SUBJECTIVE:                                                                                                                                                                                             SUBJECTIVE STATEMENT: The patient reports he is doing good this week. He has not had any falls and notes no pain.  Pt accompanied by: self  PERTINENT HISTORY: Hx of LBP (saw PT 2023), HLD, HTN, MCI, Parkinsonism,  REM sleep disorder  PAIN:  Are you having pain? No  PRECAUTIONS: Fall  WEIGHT BEARING RESTRICTIONS: No  FALLS: Has patient fallen in last 6 months? No  LIVING ENVIRONMENT: Lives with: lives with their spouse Lives in: House/apartment Stairs: Yes: External: 3 steps; none Has following equipment at home: None  PLOF: Independent, Vocation/Vocational requirements: lifting up to 85 lbs, and Leisure: enjoys bowling-hasn't done in several years  PATIENT GOALS: To improve moving around better, to get back to bowling.  OBJECTIVE:   TREATMENT: 04/16/2022 Activity Comments  Supine chest opening with T and V positions Holding 30 seconds each position  Supine horizontal abduction with coregous ball mid t-spine for opening X 10 reps  Supine trunk rotation X 5 reps  Standing large amplitude motion Sit<>stand Weight shifting R and L Rotation R and L X 10 reps each  Blue band resisted stepping to 6 targets on the floor working on anterior/posterior/ lateral/diagonal stepping patterns With supervision and demo for larger amplitude working on alternating patterns  Sit<>stand x 10 reps Emphasizing upright posture  Lunges with rowing scoops R and L sides With SBA for safety  Forward/backward gait With blue band at ankles for resistance   Dynamic gait activitieis Slow/fast, start/stops, and turns with SBA  Marches with overhead bar raises to engage into upright positioning  X 10 reps   Access  Code: [email or web address] URL: [email or web address] Date: 04/07/2022 Prepared by: [facility]  Exercises - Sit to Stand with Hands on Knees  - 1 x daily - 7 x weekly - 3 sets - 5 reps - Step Forward with Opposite Arm Reach  - 1 x daily - 5 x weekly - 1-2 sets - 10 reps -PWR! Moves in sitting, 2 x 10 reps -PWR! Moves in standing, 2 x 10 reps  PATIENT EDUCATION: Education details: Review of standing PWR! Moves (hold on Twist for now, due to pain in chest, trunk area); continue focus on best posture, arm swing with gait; provided community resource information to patient Person educated: Patient Education method: Consulting civil engineer, Demonstration, Verbal cues, and Handouts Education comprehension: verbalized understanding, returned demonstration, verbal cues required, and needs further education   ---------------------------------------------------------------------------------------------------- Objective measures below taken at initial evaluation:  DIAGNOSTIC FINDINGS: DaTscan 01/14/2022:  Bilateral significant decreased radiotracer activity within the striata. Findings consistent with significant loss of dopamine transport populations. Findings associated with Parkinson's syndrome pathology.  COGNITION: Overall cognitive status:  Pt reports easily fatigued; reports "as you are talking, my mind is all over"   SENSATION: Light touch: WFL  COORDINATION: Slowed overall motion  MUSCLE TONE: LLE: Moderate RLE tone:  minimal increase with P/RoM POSTURE: rounded shoulders and forward head  LOWER EXTREMITY ROM:   WFL except ankle dorsiflexion  Active  Right Eval Left Eval  Ankle dorsiflexion 3 3   LOWER EXTREMITY MMT:    MMT Right Eval Left Eval  Hip flexion 4 4  Hip extension    Knee flexion 4 4  Knee extension 4 4  Ankle dorsiflexion 4 4  Ankle plantarflexion    Ankle inversion    Ankle eversion    (Blank rows = not  tested)  TRANSFERS: Assistive device utilized: None  Sit to stand: Modified independence Stand to sit: Modified independence  STAIRS: Level of Assistance: Modified independence Stair Negotiation Technique: Alternating Pattern  with No Rails Number of Stairs: 2-3  Height of Stairs: 4-6"  Comments: Slowed, reports weakness at times with negotiating steps at home.  GAIT: Gait pattern: step through pattern, decreased arm swing- Right, decreased arm swing- Left, decreased step length- Right, decreased step length- Left, decreased ankle dorsiflexion- Right, decreased ankle dorsiflexion- Left, narrow BOS, poor foot clearance- Right, and poor foot clearance- Left Distance walked: 50 ft x 4 reps Assistive device utilized: None Level of assistance: Modified independence Comments: Able to improve foot clearance and step length with cues for BIG intention  FUNCTIONAL TESTS:  5 times sit to stand: 17.68 sec (no UE support) Timed up and go (TUG): 15.56 sec TUG cognitive: 17.13 sec MiniBESTest:  17/28 61M:  14.06 sec = 2.33 ft/sec  GOALS: Goals reviewed with patient? Yes  SHORT TERM GOALS: Target date: 04/10/2022  Pt will be independent with HEP for improved strength, balance, gait. Baseline:  requires cues  Goal status: PARTIALLY MET, 04/07/2022  2.  Pt will improve 5x sit<>stand to less than or equal to 13 sec to demonstrate improved functional strength and transfer efficiency. Baseline: 17.68 sec>12.19 sec at best Goal status: GOAL MET, 04/07/2022  3.  Pt will improve gait velocity to at least 2.62 ft/sec for improved gait efficiency and safety. Baseline: 2.33 ft/sec>3.82 ft/sec Goal status: GOAL MET, 04/07/2022  4.  Pt will verbalize understanding of local Parkinson's disease community resources. Baseline: info provided 04/14/2022 Goal status: GOAL MET, 04/14/2022  LONG TERM GOALS: Target date: 05/08/2022  Pt will be independent with HEP for improved strength, balance, gait. Baseline:   Goal status: ONGOING  2.  Pt will improve 5x sit<>stand to less than or equal to 11 sec to demonstrate improved functional strength and transfer efficiency. Baseline: 17.68 sec Goal status:ONGOING  3.  Pt will improve TUG score to less than or equal to 13.5 sec for decreased fall risk.  Baseline: 15.56 sec Goal status: ONGOING  4.  Pt will improve MiniBESTest score to at least 22/28 to decrease fall risk. Baseline: 17/28 Goal status:ONGOING  ASSESSMENT:  CLINICAL IMPRESSION: The patient tolerated therapy well today and we continued to progress multi-directional stepping, large amplitude movements, and dynamic gait. We discussed adding a night light to home environment to reduce fall risk. Plan to continue to progress towards LTGs for improved mobility and dec'd fall risk.  OBJECTIVE IMPAIRMENTS: Abnormal gait, decreased balance, decreased coordination, decreased mobility, difficulty walking, decreased ROM, decreased strength, impaired flexibility, and postural dysfunction.   PLAN:  PT FREQUENCY: 2x/week  PT DURATION: 8 weeks plus 1x/wk of eval = 9 week POC  PLANNED INTERVENTIONS: Therapeutic exercises, Therapeutic activity, Neuromuscular re-education, Balance training, Gait training, Patient/Family education, Self Care, Stair training, and DME instructions  PLAN FOR NEXT SESSION:   Review updates to HEP. PWR! Moves-may try quadruped position.    Work on step through, larger step gait pattern. Gait training with walking poles to facilitate reciprocal arm swing (if weather is nice, may try outside). Squats and floor to stand transfers.    Rudell Cobb, [place] 04/16/2022   [facility] [place], [place], [place] [address] Phone # [phone] Fax # [phone]

## 2022-04-21 ENCOUNTER — Ambulatory Visit: Payer: 59 | Admitting: Physical Therapy

## 2022-04-21 ENCOUNTER — Encounter: Payer: Self-pay | Admitting: Physical Therapy

## 2022-04-21 DIAGNOSIS — R2689 Other abnormalities of gait and mobility: Secondary | ICD-10-CM

## 2022-04-21 DIAGNOSIS — R2681 Unsteadiness on feet: Secondary | ICD-10-CM

## 2022-04-21 DIAGNOSIS — R29818 Other symptoms and signs involving the nervous system: Secondary | ICD-10-CM

## 2022-04-21 DIAGNOSIS — M6281 Muscle weakness (generalized): Secondary | ICD-10-CM

## 2022-04-21 NOTE — Therapy (Signed)
OUTPATIENT PHYSICAL THERAPY NEURO TREATMENT NOTE   Patient Name: Tyler Dickerson MRN: [id number] DOB:04-12-1965, 57 y.o., male Today's Date: 04/21/2022   PCP: Lujean Amel, MD REFERRING PROVIDER: Ludwig Clarks, DO   END OF SESSION:  PT End of Session - 04/21/22 1107     Visit Number 12    Number of Visits 18    Date for PT Re-Evaluation 05/09/22    Authorization Type UHC 2024; no VL    PT Start Time 1105    PT Stop Time [id number]    PT Time Calculation (min) 40 min    Activity Tolerance Patient tolerated treatment well    Behavior During Therapy [facility] for tasks assessed/performed              Past Medical History:  Diagnosis Date   Atypical chest pain 11/03/2016   Dyslipidemia 11/03/2016   Family history of abdominal aortic aneurysm (AAA) 11/03/2016   Family history of peripheral arterial disease 11/03/2016   History of COVID-19 10/2020   Hypercholesteremia    Mild   Hyperplastic colon polyp    Hypertension    Mild cognitive impairment, concerns for Lewy body disease 02/23/2022   Parkinsonism    REM sleep behavior disorder    Tremor    Past Surgical History:  Procedure Laterality Date   COLONOSCOPY  2015   REPLACEMENT TOTAL KNEE Left 1996   Patient Active Problem List   Diagnosis Date Noted   Hyperplastic colon polyp 02/23/2022   Hypertension 02/23/2022   REM sleep behavior disorder 02/23/2022   Mild cognitive impairment, concerns for Lewy body disease 02/23/2022   Tremor    Parkinsonism    Atypical chest pain 11/03/2016   Dyslipidemia 11/03/2016   Family history of peripheral arterial disease 11/03/2016   Family history of abdominal aortic aneurysm (AAA) 11/03/2016    ONSET DATE: 02/27/2022 (MD referral)  REFERRING DIAG: G20.A1 (ICD-10-CM) - Parkinson's disease without dyskinesia or fluctuating manifestations   THERAPY DIAG:  Other abnormalities of gait and mobility  Unsteadiness on feet  Muscle weakness (generalized)  Other symptoms and  signs involving the nervous system  Rationale for Evaluation and Treatment: Rehabilitation  SUBJECTIVE:                                                                                                                                                                                             SUBJECTIVE STATEMENT: Doing pretty good.  Had to load stuff into the plane at work last night, and that bothered my back a bit. Pt accompanied by: self  PERTINENT HISTORY: Hx of LBP (saw PT 2023), HLD, HTN,  MCI, Parkinsonism, REM sleep disorder  PAIN:  Are you having pain? Yes: NPRS scale: 5/10 Pain location: back Pain description: tight and stiff Aggravating factors: repetitive lifting Relieving factors: stretching and exercises    PRECAUTIONS: Fall  WEIGHT BEARING RESTRICTIONS: No  FALLS: Has patient fallen in last 6 months? No  LIVING ENVIRONMENT: Lives with: lives with their spouse Lives in: House/apartment Stairs: Yes: External: 3 steps; none Has following equipment at home: None  PLOF: Independent, Vocation/Vocational requirements: lifting up to 85 lbs, and Leisure: enjoys bowling-hasn't done in several years  PATIENT GOALS: To improve moving around better, to get back to bowling.  OBJECTIVE:    TODAY'S TREATMENT: 04/21/2022 Activity Comments  Supine low back stretches:  SKTC 3 x 30" Lower trunk rotation, 5 x 10" with arms in T-position   PWR! Moves in supine: -PWR! Up through shoulders x 10, through hips x 10 -PWR! Rock x 10 -PWR! Twist x 10 -PWR! Step x 10 For posture, weightshift, trunk flexibility, initiation of step  Verbal and visual cues for technique and amplitude  PWR! Moves in quadruped: -PWR! Up x 10 -PWR! Rock x 10 -PWR! Twist x 10 -PWR! Step x 10 For posture, weightshift, trunk flexibility, initiation of step  Verbal and visual cues for technique and amplitude  Sit to stand, x 10 reps from mat, 2 sets Cues for upright posture upon standing  Forward step  and stop with alt arm motion   Forward Lunges with rowing scoops R and L sides SBA and cues for large motion  Forward march holding Boomwhacker as target for high knees, 20 ft x 2   Gait indoors, 50 ft x 4 reps, cues for large step length, increased arm swing   Gait outdoors using bilat walking poles, 50 ft x 6 reps, with verbal, tactile, hand over hand cues Pt has difficulty with reciprocal pattern, even with hand over hand assist on one side, pt tends to use both poles together.    Access Code: [email or web address] URL: [email or web address] Date: 04/07/2022 Prepared by: [facility]  Exercises - Sit to Stand with Hands on Knees  - 1 x daily - 7 x weekly - 3 sets - 5 reps - Step Forward with Opposite Arm Reach  - 1 x daily - 5 x weekly - 1-2 sets - 10 reps -PWR! Moves in sitting, 2 x 10 reps -PWR! Moves in standing, 2 x 10 reps    ---------------------------------------------------------------------------------------------------- Objective measures below taken at initial evaluation:  DIAGNOSTIC FINDINGS: DaTscan 01/14/2022:  Bilateral significant decreased radiotracer activity within the striata. Findings consistent with significant loss of dopamine transport populations. Findings associated with Parkinson's syndrome pathology.  COGNITION: Overall cognitive status:  Pt reports easily fatigued; reports "as you are talking, my mind is all over"   SENSATION: Light touch: WFL  COORDINATION: Slowed overall motion  MUSCLE TONE: LLE: Moderate RLE tone:  minimal increase with P/RoM POSTURE: rounded shoulders and forward head  LOWER EXTREMITY ROM:   WFL except ankle dorsiflexion  Active  Right Eval Left Eval  Ankle dorsiflexion 3 3   LOWER EXTREMITY MMT:    MMT Right Eval Left Eval  Hip flexion 4 4  Hip extension    Knee flexion 4 4  Knee extension 4 4  Ankle dorsiflexion 4 4  Ankle plantarflexion    Ankle inversion    Ankle  eversion    (Blank rows = not tested)  TRANSFERS: Assistive device utilized: None  Sit to stand: Modified independence Stand to sit: Modified independence  STAIRS: Level of Assistance: Modified independence Stair Negotiation Technique: Alternating Pattern  with No Rails Number of Stairs: 2-3  Height of Stairs: 4-6"  Comments: Slowed, reports weakness at times with negotiating steps at home.  GAIT: Gait pattern: step through pattern, decreased arm swing- Right, decreased arm swing- Left, decreased step length- Right, decreased step length- Left, decreased ankle dorsiflexion- Right, decreased ankle dorsiflexion- Left, narrow BOS, poor foot clearance- Right, and poor foot clearance- Left Distance walked: 50 ft x 4 reps Assistive device utilized: None Level of assistance: Modified independence Comments: Able to improve foot clearance and step length with cues for BIG intention  FUNCTIONAL TESTS:  5 times sit to stand: 17.68 sec (no UE support) Timed up and go (TUG): 15.56 sec TUG cognitive: 17.13 sec MiniBESTest:  17/28 16M:  14.06 sec = 2.33 ft/sec  GOALS: Goals reviewed with patient? Yes  SHORT TERM GOALS: Target date: 04/10/2022  Pt will be independent with HEP for improved strength, balance, gait. Baseline:  requires cues  Goal status: PARTIALLY MET, 04/07/2022  2.  Pt will improve 5x sit<>stand to less than or equal to 13 sec to demonstrate improved functional strength and transfer efficiency. Baseline: 17.68 sec>12.19 sec at best Goal status: GOAL MET, 04/07/2022  3.  Pt will improve gait velocity to at least 2.62 ft/sec for improved gait efficiency and safety. Baseline: 2.33 ft/sec>3.82 ft/sec Goal status: GOAL MET, 04/07/2022  4.  Pt will verbalize understanding of local Parkinson's disease community resources. Baseline: info provided 04/14/2022 Goal status: GOAL MET, 04/14/2022  LONG TERM GOALS: Target date: 05/08/2022  Pt will be independent with HEP for improved  strength, balance, gait. Baseline:  Goal status: ONGOING  2.  Pt will improve 5x sit<>stand to less than or equal to 11 sec to demonstrate improved functional strength and transfer efficiency. Baseline: 17.68 sec Goal status:ONGOING  3.  Pt will improve TUG score to less than or equal to 13.5 sec for decreased fall risk.  Baseline: 15.56 sec Goal status: ONGOING  4.  Pt will improve MiniBESTest score to at least 22/28 to decrease fall risk. Baseline: 17/28 Goal status:ONGOING  ASSESSMENT:  CLINICAL IMPRESSION: Skilled PT session today focused on varied positions of PWR! Moves (supine and quadruped), both for trunk stretching and for bed mobility, transfer training.  Pt has limited hip mobility noted with difficulty bringing foot fully to foot-flat in quadruped step position.  Trialed bilateral walking poles on outdoor surfaces, and pt has difficulty sequencing, even with manual cues.  He will continue to benefit from skilled PT towards goals for improved overall functional mobility.  OBJECTIVE IMPAIRMENTS: Abnormal gait, decreased balance, decreased coordination, decreased mobility, difficulty walking, decreased ROM, decreased strength, impaired flexibility, and postural dysfunction.   PLAN:  PT FREQUENCY: 2x/week  PT DURATION: 8 weeks plus 1x/wk of eval = 9 week POC  PLANNED INTERVENTIONS: Therapeutic exercises, Therapeutic activity, Neuromuscular re-education, Balance training, Gait training, Patient/Family education, Self Care, Stair training, and DME instructions  PLAN FOR NEXT SESSION:    PWR! Moves-may try quadruped and supine positions again.  Discuss PWR! Moves classes and perhaps trying them out.   Work on step through, larger step gait pattern. Try again gait training with walking poles to facilitate reciprocal arm swing (if weather is nice, may try outside). Squats and floor to stand transfers.    Mady Haagensen, PT 04/21/22 11:50 AM Phone: [phone] Fax: [phone]    [facility] at [facility] [address], [place],  [address] Phone # [phone] Fax # [phone]

## 2022-04-23 ENCOUNTER — Encounter: Payer: Self-pay | Admitting: Physical Therapy

## 2022-04-23 ENCOUNTER — Ambulatory Visit: Payer: 59 | Admitting: Physical Therapy

## 2022-04-23 DIAGNOSIS — R2689 Other abnormalities of gait and mobility: Secondary | ICD-10-CM

## 2022-04-23 DIAGNOSIS — R29818 Other symptoms and signs involving the nervous system: Secondary | ICD-10-CM

## 2022-04-23 DIAGNOSIS — M6281 Muscle weakness (generalized): Secondary | ICD-10-CM

## 2022-04-23 DIAGNOSIS — R2681 Unsteadiness on feet: Secondary | ICD-10-CM

## 2022-04-23 NOTE — Therapy (Signed)
OUTPATIENT PHYSICAL THERAPY NEURO TREATMENT NOTE   Patient Name: Tyler Dickerson MRN: [id number] DOB:1966-03-04, 57 y.o., male Today's Date: 04/23/2022   PCP: Lujean Amel, MD REFERRING PROVIDER: Ludwig Clarks, DO   END OF SESSION:  PT End of Session - 04/23/22 1151     Visit Number 13    Number of Visits 18    Date for PT Re-Evaluation 05/09/22    Authorization Type UHC 2024; no VL    PT Start Time 1148    PT Stop Time 1229    PT Time Calculation (min) 41 min    Activity Tolerance Patient tolerated treatment well    Behavior During Therapy [facility] for tasks assessed/performed              Past Medical History:  Diagnosis Date   Atypical chest pain 11/03/2016   Dyslipidemia 11/03/2016   Family history of abdominal aortic aneurysm (AAA) 11/03/2016   Family history of peripheral arterial disease 11/03/2016   History of COVID-19 10/2020   Hypercholesteremia    Mild   Hyperplastic colon polyp    Hypertension    Mild cognitive impairment, concerns for Lewy body disease 02/23/2022   Parkinsonism    REM sleep behavior disorder    Tremor    Past Surgical History:  Procedure Laterality Date   COLONOSCOPY  2015   REPLACEMENT TOTAL KNEE Left 1996   Patient Active Problem List   Diagnosis Date Noted   Hyperplastic colon polyp 02/23/2022   Hypertension 02/23/2022   REM sleep behavior disorder 02/23/2022   Mild cognitive impairment, concerns for Lewy body disease 02/23/2022   Tremor    Parkinsonism    Atypical chest pain 11/03/2016   Dyslipidemia 11/03/2016   Family history of peripheral arterial disease 11/03/2016   Family history of abdominal aortic aneurysm (AAA) 11/03/2016    ONSET DATE: 02/27/2022 (MD referral)  REFERRING DIAG: G20.A1 (ICD-10-CM) - Parkinson's disease without dyskinesia or fluctuating manifestations   THERAPY DIAG:  Other abnormalities of gait and mobility  Unsteadiness on feet  Muscle weakness (generalized)  Other symptoms and  signs involving the nervous system  Rationale for Evaluation and Treatment: Rehabilitation  SUBJECTIVE:                                                                                                                                                                                             SUBJECTIVE STATEMENT: Nothing new.  Haven't looked into the exercise options yet. Pt accompanied by: self  PERTINENT HISTORY: Hx of LBP (saw PT 2023), HLD, HTN, MCI, Parkinsonism, REM sleep disorder  PAIN:  Are you having pain?  Yes: NPRS scale: 4/10 Pain location: L rib and chest area; back Pain description: tight and stiff Aggravating factors: repetitive lifting Relieving factors: stretching and exercises, Tylenol    PRECAUTIONS: Fall  WEIGHT BEARING RESTRICTIONS: No  FALLS: Has patient fallen in last 6 months? No  LIVING ENVIRONMENT: Lives with: lives with their spouse Lives in: House/apartment Stairs: Yes: External: 3 steps; none Has following equipment at home: None  PLOF: Independent, Vocation/Vocational requirements: lifting up to 85 lbs, and Leisure: enjoys bowling-hasn't done in several years  PATIENT GOALS: To improve moving around better, to get back to bowling.  OBJECTIVE:      TODAY'S TREATMENT: 04/23/2022 Activity Comments  NuStep, Level 4>5, 4 extremities x 6 minutes for aerobic warm-up SPM 80-85, conversation tasks and pt able to keep speed at a consistent speed; rates effort level as 5-6/10  Supine low back stretches:  SKTC 3 x 30" Lower trunk rotation, 5 x 10" with arms in T-position Good form   PWR! Moves in supine: -PWR! Up through shoulders x 10, through hips x 10 -PWR! Rock x 10 -PWR! Twist x 10 -PWR! Step x 10 For posture, weightshift, trunk flexibility, initiation of step  Verbal and visual cues for technique and amplitude  PWR! Moves in quadruped: -PWR! Up x 10 -PWR! Rock x 10 -PWR! Twist x 10 -PWR! Step x 10 For posture, weightshift, trunk  flexibility, initiation of step  Verbal and visual cues for technique and amplitude  Sit to stand, x 10 reps from mat, holding green therapy ball, then second set standing on Airex mat Cues for upright posture upon standing    PATIENT EDUCATION: Education details: Provided handouts on PWR! Moves classes and encouraged pt to look into community fitness options upon d/c from PT Person educated: Patient Education method: Explanation and Handouts Education comprehension: verbalized understanding   Access Code: [email or web address] URL: [email or web address] Date: 04/07/2022 Prepared by: [facility]  Exercises - Sit to Stand with Hands on Knees  - 1 x daily - 7 x weekly - 3 sets - 5 reps - Step Forward with Opposite Arm Reach  - 1 x daily - 5 x weekly - 1-2 sets - 10 reps -PWR! Moves in sitting, 2 x 10 reps -PWR! Moves in standing, 2 x 10 reps    ---------------------------------------------------------------------------------------------------- Objective measures below taken at initial evaluation:  DIAGNOSTIC FINDINGS: DaTscan 01/14/2022:  Bilateral significant decreased radiotracer activity within the striata. Findings consistent with significant loss of dopamine transport populations. Findings associated with Parkinson's syndrome pathology.  COGNITION: Overall cognitive status:  Pt reports easily fatigued; reports "as you are talking, my mind is all over"   SENSATION: Light touch: WFL  COORDINATION: Slowed overall motion  MUSCLE TONE: LLE: Moderate RLE tone:  minimal increase with P/RoM POSTURE: rounded shoulders and forward head  LOWER EXTREMITY ROM:   WFL except ankle dorsiflexion  Active  Right Eval Left Eval  Ankle dorsiflexion 3 3   LOWER EXTREMITY MMT:    MMT Right Eval Left Eval  Hip flexion 4 4  Hip extension    Knee flexion 4 4  Knee extension 4 4  Ankle dorsiflexion 4 4  Ankle plantarflexion    Ankle  inversion    Ankle eversion    (Blank rows = not tested)  TRANSFERS: Assistive device utilized: None  Sit to stand: Modified independence Stand to sit: Modified independence  STAIRS: Level of Assistance: Modified independence Stair Negotiation Technique: Alternating Pattern  with  No Rails Number of Stairs: 2-3  Height of Stairs: 4-6"  Comments: Slowed, reports weakness at times with negotiating steps at home.  GAIT: Gait pattern: step through pattern, decreased arm swing- Right, decreased arm swing- Left, decreased step length- Right, decreased step length- Left, decreased ankle dorsiflexion- Right, decreased ankle dorsiflexion- Left, narrow BOS, poor foot clearance- Right, and poor foot clearance- Left Distance walked: 50 ft x 4 reps Assistive device utilized: None Level of assistance: Modified independence Comments: Able to improve foot clearance and step length with cues for BIG intention  FUNCTIONAL TESTS:  5 times sit to stand: 17.68 sec (no UE support) Timed up and go (TUG): 15.56 sec TUG cognitive: 17.13 sec MiniBESTest:  17/28 34M:  14.06 sec = 2.33 ft/sec  GOALS: Goals reviewed with patient? Yes  SHORT TERM GOALS: Target date: 04/10/2022  Pt will be independent with HEP for improved strength, balance, gait. Baseline:  requires cues  Goal status: PARTIALLY MET, 04/07/2022  2.  Pt will improve 5x sit<>stand to less than or equal to 13 sec to demonstrate improved functional strength and transfer efficiency. Baseline: 17.68 sec>12.19 sec at best Goal status: GOAL MET, 04/07/2022  3.  Pt will improve gait velocity to at least 2.62 ft/sec for improved gait efficiency and safety. Baseline: 2.33 ft/sec>3.82 ft/sec Goal status: GOAL MET, 04/07/2022  4.  Pt will verbalize understanding of local Parkinson's disease community resources. Baseline: info provided 04/14/2022 Goal status: GOAL MET, 04/14/2022  LONG TERM GOALS: Target date: 05/08/2022  Pt will be independent with HEP  for improved strength, balance, gait. Baseline:  Goal status: ONGOING  2.  Pt will improve 5x sit<>stand to less than or equal to 11 sec to demonstrate improved functional strength and transfer efficiency. Baseline: 17.68 sec Goal status:ONGOING  3.  Pt will improve TUG score to less than or equal to 13.5 sec for decreased fall risk.  Baseline: 15.56 sec Goal status: ONGOING  4.  Pt will improve MiniBESTest score to at least 22/28 to decrease fall risk. Baseline: 17/28 Goal status:ONGOING  ASSESSMENT:  CLINICAL IMPRESSION: Skilled PT session today focused on education/reinforcement of education provided about PWR! Moves and other options for continued community fitness.  Educated pt to look into options that may be of interest to him in order to formulate plan for continued community fitness upon d/c from PT.  With aerobic activity today and PWR! Moves in supine and quadruped, reinforced intensity/amplitude of movements with rating of effort about 6/10 throughout.  He will continue to benefit from skilled PT to further address balance, functional strength, and gait towards goals for improved overall functional mobility.  OBJECTIVE IMPAIRMENTS: Abnormal gait, decreased balance, decreased coordination, decreased mobility, difficulty walking, decreased ROM, decreased strength, impaired flexibility, and postural dysfunction.   PLAN:  PT FREQUENCY: 2x/week  PT DURATION: 8 weeks plus 1x/wk of eval = 9 week POC  PLANNED INTERVENTIONS: Therapeutic exercises, Therapeutic activity, Neuromuscular re-education, Balance training, Gait training, Patient/Family education, Self Care, Stair training, and DME instructions  PLAN FOR NEXT SESSION:   PWR! Moves-may try prone as well as trying to get up from floor.  Discuss PWR! Moves classes and perhaps trying them out.   Work on step through, larger step gait pattern. Try again gait training with walking poles to facilitate reciprocal arm swing (if  weather is nice, may try outside). Squats and floor to stand transfers.    Mady Haagensen, PT 04/23/22 1:29 PM Phone: [phone] Fax: [phone]   [place]  at [facility] [address], [place], [place] [address] Phone # [phone] Fax # [phone]

## 2022-04-27 ENCOUNTER — Ambulatory Visit (INDEPENDENT_AMBULATORY_CARE_PROVIDER_SITE_OTHER): Payer: 59 | Admitting: Physician Assistant

## 2022-04-27 ENCOUNTER — Encounter: Payer: Self-pay | Admitting: Physician Assistant

## 2022-04-27 VITALS — BP 110/80 | HR 78 | Resp 18 | Ht 69.0 in | Wt 186.0 lb

## 2022-04-27 DIAGNOSIS — G3184 Mild cognitive impairment, so stated: Secondary | ICD-10-CM

## 2022-04-27 DIAGNOSIS — G4752 REM sleep behavior disorder: Secondary | ICD-10-CM

## 2022-04-27 DIAGNOSIS — G20C Parkinsonism, unspecified: Secondary | ICD-10-CM

## 2022-04-27 NOTE — Patient Instructions (Addendum)
It was a pleasure to see you today at our office.   Recommendations:  Keep appt with Psychiatry  Follow up Aug 30 at 11:30   Appt with Dr Tat in 07/2022, continue the meds s prescribed   RECOMMENDATIONS FOR ALL PATIENTS WITH MEMORY PROBLEMS: 1. Continue to exercise (Recommend 30 minutes of walking everyday, or 3 hours every week) 2. Increase social interactions - continue going to [place] and enjoy social gatherings with friends and family 3. Eat healthy, avoid fried foods and eat more fruits and vegetables 4. Maintain adequate blood pressure, blood sugar, and blood cholesterol level. Reducing the risk of stroke and cardiovascular disease also helps promoting better memory. 5. Avoid stressful situations. Live a simple life and avoid aggravations. Organize your time and prepare for the next day in anticipation. 6. Sleep well, avoid any interruptions of sleep and avoid any distractions in the bedroom that may interfere with adequate sleep quality 7. Avoid sugar, avoid sweets as there is a strong link between excessive sugar intake, diabetes, and cognitive impairment We discussed the Mediterranean diet, which has been shown to help patients reduce the risk of progressive memory disorders and reduces cardiovascular risk. This includes eating fish, eat fruits and green leafy vegetables, nuts like almonds and hazelnuts, walnuts, and also use olive oil. Avoid fast foods and fried foods as much as possible. Avoid sweets and sugar as sugar use has been linked to worsening of memory function.  There is always a concern of gradual progression of memory problems. If this is the case, then we may need to adjust level of care according to patient needs. Support, both to the patient and caregiver, should then be put into place.       FALL PRECAUTIONS: Be cautious when walking. Scan the area for obstacles that may increase the risk of trips and falls. When getting up in the mornings, sit up at the edge of  the bed for a few minutes before getting out of bed. Consider elevating the bed at the head end to avoid drop of blood pressure when getting up. Walk always in a well-lit room (use night lights in the walls). Avoid area rugs or power cords from appliances in the middle of the walkways. Use a walker or a cane if necessary and consider physical therapy for balance exercise. Get your eyesight checked regularly.  FINANCIAL OVERSIGHT: Supervision, especially oversight when making financial decisions or transactions is also recommended.  HOME SAFETY: Consider the safety of the kitchen when operating appliances like stoves, microwave oven, and blender. Consider having supervision and share cooking responsibilities until no longer able to participate in those. Accidents with firearms and other hazards in the house should be identified and addressed as well.   ABILITY TO BE LEFT ALONE: If patient is unable to contact 911 operator, consider using LifeLine, or when the need is there, arrange for someone to stay with patients. Smoking is a fire hazard, consider supervision or cessation. Risk of wandering should be assessed by caregiver and if detected at any point, supervision and safe proof recommendations should be instituted.  MEDICATION SUPERVISION: Inability to self-administer medication needs to be constantly addressed. Implement a mechanism to ensure safe administration of the medications.   DRIVING: Regarding driving, in patients with progressive memory problems, driving will be impaired. We advise to have someone else do the driving if trouble finding directions or if minor accidents are reported. Independent driving assessment is available to determine safety of driving.   If  you are interested in the driving assessment, you can contact the following:  [employer] in [place]  [place] [phone] or [phone]    [place] refers to food and lifestyle choices that are based on the traditions of countries located on the [employer]. This way of eating has been shown to help prevent certain conditions and improve outcomes for people who have chronic diseases, like kidney disease and heart disease. What are tips for following this plan? Lifestyle  Cook and eat meals together with your family, when possible. Drink enough fluid to keep your urine clear or pale yellow. Be physically active every day. This includes: Aerobic exercise like running or swimming. Leisure activities like gardening, walking, or housework. Get 7-8 hours of sleep each night. If recommended by your health care provider, drink red wine in moderation. This means 1 glass a day for nonpregnant women and 2 glasses a day for men. A glass of wine equals 5 oz (150 mL). Reading food labels  Check the serving size of packaged foods. For foods such as rice and pasta, the serving size refers to the amount of cooked product, not dry. Check the total fat in packaged foods. Avoid foods that have saturated fat or trans fats. Check the ingredients list for added sugars, such as corn syrup. Shopping  At the grocery store, buy most of your food from the areas near the walls of the store. This includes: Fresh fruits and vegetables (produce). Grains, beans, nuts, and seeds. Some of these may be available in unpackaged forms or large amounts (in bulk). Fresh seafood. Poultry and eggs. Low-fat dairy products. Buy whole ingredients instead of prepackaged foods. Buy fresh fruits and vegetables in-season from local farmers markets. Buy frozen fruits and vegetables in resealable bags. If you do not have access to quality fresh seafood, buy precooked frozen shrimp or canned fish, such as tuna, salmon, or sardines. Buy small amounts of raw or cooked vegetables, salads, or olives from  the deli or salad bar at your store. Stock your pantry so you always have certain foods on hand, such as olive oil, canned tuna, canned tomatoes, rice, pasta, and beans. Cooking  Cook foods with extra-virgin olive oil instead of using butter or other vegetable oils. Have meat as a side dish, and have vegetables or grains as your main dish. This means having meat in small portions or adding small amounts of meat to foods like pasta or stew. Use beans or vegetables instead of meat in common dishes like chili or lasagna. Experiment with different cooking methods. Try roasting or broiling vegetables instead of steaming or sauteing them. Add frozen vegetables to soups, stews, pasta, or rice. Add nuts or seeds for added healthy fat at each meal. You can add these to yogurt, salads, or vegetable dishes. Marinate fish or vegetables using olive oil, lemon juice, garlic, and fresh herbs. Meal planning  Plan to eat 1 vegetarian meal one day each week. Try to work up to 2 vegetarian meals, if possible. Eat seafood 2 or more times a week. Have healthy snacks readily available, such as: Vegetable sticks with hummus. Greek yogurt. Fruit and nut trail mix. Eat balanced meals throughout the week. This includes: Fruit: 2-3 servings a day Vegetables: 4-5 servings a day Low-fat dairy: 2 servings a day Fish, poultry, or lean meat: 1 serving a day Beans and legumes: 2 or more servings  a week Nuts and seeds: 1-2 servings a day Whole grains: 6-8 servings a day Extra-virgin olive oil: 3-4 servings a day Limit red meat and sweets to only a few servings a month What are my food choices? Mediterranean diet Recommended Grains: Whole-grain pasta. Brown rice. Bulgar wheat. Polenta. Couscous. Whole-wheat bread. [place]. Vegetables: Artichokes. Beets. Broccoli. Cabbage. Carrots. Eggplant. Green beans. Chard. Kale. Spinach. Onions. Leeks. Peas. Squash. Tomatoes. Peppers. Radishes. Fruits: Apples. Apricots.  Avocado. Berries. Bananas. Cherries. Dates. Figs. Grapes. Lemons. Melon. Oranges. Peaches. Plums. Pomegranate. Meats and other protein foods: Beans. Almonds. Sunflower seeds. Pine nuts. Peanuts. [place]. Salmon. Scallops. Shrimp. [place]. Tilapia. Clams. Oysters. Eggs. Dairy: Low-fat milk. Cheese. Greek yogurt. Beverages: Water. Red wine. Herbal tea. Fats and oils: Extra virgin olive oil. Avocado oil. Grape seed oil. Sweets and desserts: [place] yogurt with honey. Baked apples. Poached pears. Trail mix. Seasoning and other foods: Basil. Cilantro. Coriander. Cumin. Mint. Parsley. Sage. Rosemary. Tarragon. Garlic. Oregano. Thyme. Pepper. Balsalmic vinegar. Tahini. Hummus. Tomato sauce. Olives. Mushrooms. Limit these Grains: Prepackaged pasta or rice dishes. Prepackaged cereal with added sugar. Vegetables: Deep fried potatoes (french fries). Fruits: Fruit canned in syrup. Meats and other protein foods: Beef. Pork. Lamb. Poultry with skin. Hot dogs. [place] Salines. Dairy: Ice cream. Sour cream. Whole milk. Beverages: Juice. Sugar-sweetened soft drinks. Beer. Liquor and spirits. Fats and oils: Butter. Canola oil. Vegetable oil. Beef fat (tallow). Lard. Sweets and desserts: Cookies. Cakes. Pies. Candy. Seasoning and other foods: Mayonnaise. Premade sauces and marinades. The items listed may not be a complete list. Talk with your dietitian about what dietary choices are right for you. Summary The Mediterranean diet includes both food and lifestyle choices. Eat a variety of fresh fruits and vegetables, beans, nuts, seeds, and whole grains. Limit the amount of red meat and sweets that you eat. Talk with your health care provider about whether it is safe for you to drink red wine in moderation. This means 1 glass a day for nonpregnant women and 2 glasses a day for men. A glass of wine equals 5 oz (150 mL). This information is not intended to replace advice given to you by your health care provider. Make sure you discuss  any questions you have with your health care provider. Document Released: 10/17/2015 Document Revised: 11/19/2015 Document Reviewed: 10/17/2015 Elsevier Interactive Patient Education  2017 [employer].
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
## 2022-04-28 ENCOUNTER — Ambulatory Visit: Payer: 59 | Admitting: Physical Therapy

## 2022-04-29 MED ORDER — MIRTAZAPINE 7.5 MG PO TABS: 7.5000 mg | ORAL_TABLET | Freq: Every day | ORAL | 11 refills | Status: DC

## 2022-04-30 ENCOUNTER — Encounter: Payer: Self-pay | Admitting: Physical Therapy

## 2022-04-30 ENCOUNTER — Ambulatory Visit: Payer: 59 | Admitting: Physical Therapy

## 2022-04-30 DIAGNOSIS — R29818 Other symptoms and signs involving the nervous system: Secondary | ICD-10-CM

## 2022-04-30 DIAGNOSIS — R2681 Unsteadiness on feet: Secondary | ICD-10-CM

## 2022-04-30 DIAGNOSIS — R2689 Other abnormalities of gait and mobility: Secondary | ICD-10-CM | POA: Diagnosis not present

## 2022-04-30 DIAGNOSIS — M6281 Muscle weakness (generalized): Secondary | ICD-10-CM

## 2022-04-30 NOTE — Therapy (Signed)
OUTPATIENT PHYSICAL THERAPY NEURO TREATMENT NOTE   Patient Name: Tyler Dickerson MRN: [id number] DOB:09-Jun-1965, 57 y.o., male Today's Date: 04/30/2022   PCP: Lujean Amel, MD REFERRING PROVIDER: Ludwig Clarks, DO   END OF SESSION:  PT End of Session - 04/30/22 1152     Visit Number 14    Number of Visits 18    Date for PT Re-Evaluation 05/09/22    Authorization Type UHC 2024; no VL    PT Start Time 1151    PT Stop Time [id number]    PT Time Calculation (min) 40 min    Activity Tolerance Patient tolerated treatment well    Behavior During Therapy [facility] for tasks assessed/performed              Past Medical History:  Diagnosis Date   Atypical chest pain 11/03/2016   Dyslipidemia 11/03/2016   Family history of abdominal aortic aneurysm (AAA) 11/03/2016   Family history of peripheral arterial disease 11/03/2016   History of COVID-19 10/2020   Hypercholesteremia    Mild   Hyperplastic colon polyp    Hypertension    Mild cognitive impairment, concerns for Lewy body disease 02/23/2022   Parkinsonism    REM sleep behavior disorder    Tremor    Past Surgical History:  Procedure Laterality Date   COLONOSCOPY  2015   REPLACEMENT TOTAL KNEE Left 1996   Patient Active Problem List   Diagnosis Date Noted   Hyperplastic colon polyp 02/23/2022   Hypertension 02/23/2022   REM sleep behavior disorder 02/23/2022   Mild cognitive impairment, concerns for Lewy body disease 02/23/2022   Tremor    Parkinsonism    Atypical chest pain 11/03/2016   Dyslipidemia 11/03/2016   Family history of peripheral arterial disease 11/03/2016   Family history of abdominal aortic aneurysm (AAA) 11/03/2016    ONSET DATE: 02/27/2022 (MD referral)  REFERRING DIAG: G20.A1 (ICD-10-CM) - Parkinson's disease without dyskinesia or fluctuating manifestations   THERAPY DIAG:  Other abnormalities of gait and mobility  Unsteadiness on feet  Muscle weakness (generalized)  Other symptoms and  signs involving the nervous system  Rationale for Evaluation and Treatment: Rehabilitation  SUBJECTIVE:                                                                                                                                                                                             SUBJECTIVE STATEMENT: Thinking about the PWR! Moves class at 62.  No pain today.  Feel like I'm moving better and correcting myself.   Pt accompanied by: self  PERTINENT HISTORY: Hx of LBP (saw PT 2023), HLD,  HTN, MCI, Parkinsonism, REM sleep disorder  PAIN:  Are you having pain? No pain  PRECAUTIONS: Fall  WEIGHT BEARING RESTRICTIONS: No  FALLS: Has patient fallen in last 6 months? No  LIVING ENVIRONMENT: Lives with: lives with their spouse Lives in: House/apartment Stairs: Yes: External: 3 steps; none Has following equipment at home: None  PLOF: Independent, Vocation/Vocational requirements: lifting up to 85 lbs, and Leisure: enjoys bowling-hasn't done in several years  PATIENT GOALS: To improve moving around better, to get back to bowling.  OBJECTIVE:     TODAY'S TREATMENT: 04/30/2022 Activity Comments  NuStep, Level 4>5, 4 extremities x 6 minutes for aerobic warm-up SPM 80-85, conversation tasks and pt able to keep speed at a consistent speed; rates effort level as 6-7/10  Gait training 500 ft, in clinic, good step length and foot clearance, added cognitive task Slightly slowed pace with cognitive task  Prone PWR! Moves: -PWR! Up 2 x 5 -PWR! Rock x 5 each side -PWR! Twist 2 x 5 -PWR! Step x 5 each side Difficulty with rock and step positions with weightshifting, will need additional practice; no pain  Minisquats >kicks x 10 Minisquats >up on toes x 10 Minisquats>up on toes/overhead reach with green ball x 10 UE support initially, cues for increased motion up on toes  Wide BOS reach/single limb stance x 10 reps, then second set with stomp for increased intensity Needs 1 UE support  for balance   Gait training including straight line gait and figure-8 around furniture, 3-laps Cues for upright posture and increased step length     PATIENT EDUCATION: Education details: Initiated discussion on plans for d/c next week, transition to community fitness (PWR! Moves at 9?) and possible return PT eval in 6 months Person educated: Patient Education method: Explanation Education comprehension: verbalized understanding  Access Code: [email or web address] URL: [email or web address] Date: 04/07/2022 Prepared by: [facility]  Exercises - Sit to Stand with Hands on Knees  - 1 x daily - 7 x weekly - 3 sets - 5 reps - Step Forward with Opposite Arm Reach  - 1 x daily - 5 x weekly - 1-2 sets - 10 reps -PWR! Moves in sitting, 2 x 10 reps -PWR! Moves in standing, 2 x 10 reps    ---------------------------------------------------------------------------------------------------- Objective measures below taken at initial evaluation:  DIAGNOSTIC FINDINGS: DaTscan 01/14/2022:  Bilateral significant decreased radiotracer activity within the striata. Findings consistent with significant loss of dopamine transport populations. Findings associated with Parkinson's syndrome pathology.  COGNITION: Overall cognitive status:  Pt reports easily fatigued; reports "as you are talking, my mind is all over"   SENSATION: Light touch: WFL  COORDINATION: Slowed overall motion  MUSCLE TONE: LLE: Moderate RLE tone:  minimal increase with P/RoM POSTURE: rounded shoulders and forward head  LOWER EXTREMITY ROM:   WFL except ankle dorsiflexion  Active  Right Eval Left Eval  Ankle dorsiflexion 3 3   LOWER EXTREMITY MMT:    MMT Right Eval Left Eval  Hip flexion 4 4  Hip extension    Knee flexion 4 4  Knee extension 4 4  Ankle dorsiflexion 4 4  Ankle plantarflexion    Ankle inversion    Ankle eversion    (Blank rows = not  tested)  TRANSFERS: Assistive device utilized: None  Sit to stand: Modified independence Stand to sit: Modified independence  STAIRS: Level of Assistance: Modified independence Stair Negotiation Technique: Alternating Pattern  with No Rails Number of Stairs: 2-3  Height of Stairs: 4-6"  Comments: Slowed, reports weakness at times with negotiating steps at home.  GAIT: Gait pattern: step through pattern, decreased arm swing- Right, decreased arm swing- Left, decreased step length- Right, decreased step length- Left, decreased ankle dorsiflexion- Right, decreased ankle dorsiflexion- Left, narrow BOS, poor foot clearance- Right, and poor foot clearance- Left Distance walked: 50 ft x 4 reps Assistive device utilized: None Level of assistance: Modified independence Comments: Able to improve foot clearance and step length with cues for BIG intention  FUNCTIONAL TESTS:  5 times sit to stand: 17.68 sec (no UE support) Timed up and go (TUG): 15.56 sec TUG cognitive: 17.13 sec MiniBESTest:  17/28 26M:  14.06 sec = 2.33 ft/sec  GOALS: Goals reviewed with patient? Yes  SHORT TERM GOALS: Target date: 04/10/2022  Pt will be independent with HEP for improved strength, balance, gait. Baseline:  requires cues  Goal status: PARTIALLY MET, 04/07/2022  2.  Pt will improve 5x sit<>stand to less than or equal to 13 sec to demonstrate improved functional strength and transfer efficiency. Baseline: 17.68 sec>12.19 sec at best Goal status: GOAL MET, 04/07/2022  3.  Pt will improve gait velocity to at least 2.62 ft/sec for improved gait efficiency and safety. Baseline: 2.33 ft/sec>3.82 ft/sec Goal status: GOAL MET, 04/07/2022  4.  Pt will verbalize understanding of local Parkinson's disease community resources. Baseline: info provided 04/14/2022 Goal status: GOAL MET, 04/14/2022  LONG TERM GOALS: Target date: 05/08/2022  Pt will be independent with HEP for improved strength, balance, gait. Baseline:   Goal status: ONGOING  2.  Pt will improve 5x sit<>stand to less than or equal to 11 sec to demonstrate improved functional strength and transfer efficiency. Baseline: 17.68 sec Goal status:ONGOING  3.  Pt will improve TUG score to less than or equal to 13.5 sec for decreased fall risk.  Baseline: 15.56 sec Goal status: ONGOING  4.  Pt will improve MiniBESTest score to at least 22/28 to decrease fall risk. Baseline: 17/28 Goal status:ONGOING  ASSESSMENT:  CLINICAL IMPRESSION: Skilled PT session today focused on continuing reinforcement on increased amplitude, intensity of movements patterns including functional strength, gait, and strength/NMR with squats and transitional activities.  Performed prone PWR! Moves on mat table, with pt having some difficulty coordinating rocking and stepping portions, with weightshifting through hips/upper body lifting.  He does not report pain in these positions, so likely he will be able to improve with repetition, hopefully in class setting.  With standing activities emphasizing large movement patterns, he fatigues prior to 10th rep, needing cues for increased amplitude/intensity of movement.  He overall seems to be progressing towards goals and anticipate he may discharge from PT next week with hopeful transition to community PWR! Moves class.  OBJECTIVE IMPAIRMENTS: Abnormal gait, decreased balance, decreased coordination, decreased mobility, difficulty walking, decreased ROM, decreased strength, impaired flexibility, and postural dysfunction.   PLAN:  PT FREQUENCY: 2x/week  PT DURATION: 8 weeks plus 1x/wk of eval = 9 week POC  PLANNED INTERVENTIONS: Therapeutic exercises, Therapeutic activity, Neuromuscular re-education, Balance training, Gait training, Patient/Family education, Self Care, Stair training, and DME instructions  PLAN FOR NEXT SESSION:   Review/finalize exercises for HEP.   Encourage pt to try PWR! Moves 11 am class at [facility].   Check goals and anticipate d/c next week.  Mady Haagensen, PT 04/30/22 12:41 PM Phone: [phone] Fax: [phone]   [place] at [facility] [place], [address] [place], [place] [address] Phone # [phone])  [phone] Fax # [phone]

## 2022-05-05 ENCOUNTER — Ambulatory Visit: Payer: 59 | Admitting: Rehabilitative and Restorative Service Providers"

## 2022-05-05 ENCOUNTER — Encounter: Payer: Self-pay | Admitting: Rehabilitative and Restorative Service Providers"

## 2022-05-05 DIAGNOSIS — R2689 Other abnormalities of gait and mobility: Secondary | ICD-10-CM | POA: Diagnosis not present

## 2022-05-05 DIAGNOSIS — R29818 Other symptoms and signs involving the nervous system: Secondary | ICD-10-CM

## 2022-05-05 DIAGNOSIS — R2681 Unsteadiness on feet: Secondary | ICD-10-CM

## 2022-05-05 DIAGNOSIS — M6281 Muscle weakness (generalized): Secondary | ICD-10-CM

## 2022-05-05 NOTE — Therapy (Signed)
OUTPATIENT PHYSICAL THERAPY NEURO TREATMENT NOTE   Patient Name: Tyler Dickerson MRN: [id number] DOB:[birth year], 57 y.o., male Today's Date: 05/05/2022   PCP: Lujean Amel, MD REFERRING PROVIDER: Ludwig Clarks, DO   END OF SESSION:  PT End of Session - 05/05/22 1105     Visit Number 15    Number of Visits 18    Date for PT Re-Evaluation 05/09/22    Authorization Type UHC 2024; no VL    PT Start Time 1105    PT Stop Time 1147    PT Time Calculation (min) 42 min    Activity Tolerance Patient tolerated treatment well    Behavior During Therapy [facility] for tasks assessed/performed            Past Medical History:  Diagnosis Date   Atypical chest pain 11/03/2016   Dyslipidemia 11/03/2016   Family history of abdominal aortic aneurysm (AAA) 11/03/2016   Family history of peripheral arterial disease 11/03/2016   History of COVID-19 10/2020   Hypercholesteremia    Mild   Hyperplastic colon polyp    Hypertension    Mild cognitive impairment, concerns for Lewy body disease 02/23/2022   Parkinsonism    REM sleep behavior disorder    Tremor    Past Surgical History:  Procedure Laterality Date   COLONOSCOPY  2015   REPLACEMENT TOTAL KNEE Left 1996   Patient Active Problem List   Diagnosis Date Noted   Hyperplastic colon polyp 02/23/2022   Hypertension 02/23/2022   REM sleep behavior disorder 02/23/2022   Mild cognitive impairment, concerns for Lewy body disease 02/23/2022   Tremor    Parkinsonism    Atypical chest pain 11/03/2016   Dyslipidemia 11/03/2016   Family history of peripheral arterial disease 11/03/2016   Family history of abdominal aortic aneurysm (AAA) 11/03/2016    ONSET DATE: 02/27/2022 (MD referral) REFERRING DIAG: G20.A1 (ICD-10-CM) - Parkinson's disease without dyskinesia or fluctuating manifestations  THERAPY DIAG:  Other abnormalities of gait and mobility  Muscle weakness (generalized)  Unsteadiness on feet  Other symptoms and signs  involving the nervous system  Rationale for Evaluation and Treatment: Rehabilitation  SUBJECTIVE:                                                                                                                                                                                        SUBJECTIVE STATEMENT: Thinking about the PWR! Moves class at 38 tomorrow.  No pain today.  Feel like I'm moving better and correcting myself.   Pt accompanied by: self  PERTINENT HISTORY: Hx of LBP (saw PT 2023), HLD, HTN, MCI, Parkinsonism, REM sleep disorder  PAIN:  Are you having pain? No pain  PRECAUTIONS: Fall  WEIGHT BEARING RESTRICTIONS: No  FALLS: Has patient fallen in last 6 months? No  LIVING ENVIRONMENT: Lives with: lives with their spouse Lives in: House/apartment Stairs: Yes: External: 3 steps; none Has following equipment at home: None  PLOF: Independent, Vocation/Vocational requirements: lifting up to 85 lbs, and Leisure: enjoys bowling-hasn't done in several years  PATIENT GOALS: To improve moving around better, to get back to bowling.  OBJECTIVE:  TREATMENT: 05/05/2022 Activity Comments  Gait on treadmill Up to 1.2 mph with bilat UE support and cues for kicking imaginary target to lengthen stride and get greater knee extension X 5 minutes for priming system   Standing PWR! Moves:  Up x 10 reps Rock x 10 reps Twist x 10 reps Step x 10 reps  Cues to open posture by standing in front of door frame and hitting centering with wide arms to touch walls   PWR! Flow moves Forward>Side>Backwards all on one side and then alternating  Finger boosts, walking poles and blocked practice used for posterior stepping  Lunge with rowing and then overhead to engage upright posture   Prone and Qped pWR! moves Prone PWR up, twist x 5 reps each  Quadriped PWR step x 5 reps R and L   Rates 8/10 effort today.   TREATMENT: 04/30/2022 Activity Comments  NuStep, Level 4>5, 4 extremities x 6  minutes for aerobic warm-up SPM 80-85, conversation tasks and pt able to keep speed at a consistent speed; rates effort level as 6-7/10  Gait training 500 ft, in clinic, good step length and foot clearance, added cognitive task Slightly slowed pace with cognitive task  Prone PWR! Moves: -PWR! Up 2 x 5 -PWR! Rock x 5 each side -PWR! Twist 2 x 5 -PWR! Step x 5 each side Difficulty with rock and step positions with weightshifting, will need additional practice; no pain  Minisquats >kicks x 10 Minisquats >up on toes x 10 Minisquats>up on toes/overhead reach with green ball x 10 UE support initially, cues for increased motion up on toes  Wide BOS reach/single limb stance x 10 reps, then second set with stomp for increased intensity Needs 1 UE support for balance   Gait training including straight line gait and figure-8 around furniture, 3-laps Cues for upright posture and increased step length     PATIENT EDUCATION: Education details: Initiated discussion on plans for d/c next week, transition to community fitness (PWR! Moves at 11?) and possible return PT eval in 6 months Person educated: Patient Education method: Explanation Education comprehension: verbalized understanding  Access Code: [email or web address] URL: [email or web address] Date: 04/07/2022 Prepared by: [facility]  Exercises - Sit to Stand with Hands on Knees  - 1 x daily - 7 x weekly - 3 sets - 5 reps - Step Forward with Opposite Arm Reach  - 1 x daily - 5 x weekly - 1-2 sets - 10 reps -PWR! Moves in sitting, 2 x 10 reps -PWR! Moves in standing, 2 x 10 reps  --------------------------------------------------------------------------------------------------- Objective measures below taken at initial evaluation:  DIAGNOSTIC FINDINGS: DaTscan 01/14/2022:  Bilateral significant decreased radiotracer activity within the striata. Findings consistent with significant loss of  dopamine transport populations. Findings associated with Parkinson's syndrome pathology.  COGNITION: Overall cognitive status:  Pt reports easily fatigued; reports "as you are talking, my mind is all over"   SENSATION: Light touch: WFL  COORDINATION: Slowed overall motion  MUSCLE TONE: LLE:  Moderate RLE tone:  minimal increase with P/RoM POSTURE: rounded shoulders and forward head  LOWER EXTREMITY ROM:   WFL except ankle dorsiflexion  Active  Right Eval Left Eval  Ankle dorsiflexion 3 3   LOWER EXTREMITY MMT:    MMT Right Eval Left Eval  Hip flexion 4 4  Hip extension    Knee flexion 4 4  Knee extension 4 4  Ankle dorsiflexion 4 4  Ankle plantarflexion    Ankle inversion    Ankle eversion    (Blank rows = not tested)  TRANSFERS: Assistive device utilized: None  Sit to stand: Modified independence Stand to sit: Modified independence  STAIRS: Level of Assistance: Modified independence Stair Negotiation Technique: Alternating Pattern  with No Rails Number of Stairs: 2-3  Height of Stairs: 4-6"  Comments: Slowed, reports weakness at times with negotiating steps at home.  GAIT: Gait pattern: step through pattern, decreased arm swing- Right, decreased arm swing- Left, decreased step length- Right, decreased step length- Left, decreased ankle dorsiflexion- Right, decreased ankle dorsiflexion- Left, narrow BOS, poor foot clearance- Right, and poor foot clearance- Left Distance walked: 50 ft x 4 reps Assistive device utilized: None Level of assistance: Modified independence Comments: Able to improve foot clearance and step length with cues for BIG intention  FUNCTIONAL TESTS:  5 times sit to stand: 17.68 sec (no UE support) Timed up and go (TUG): 15.56 sec TUG cognitive: 17.13 sec MiniBESTest:  17/28 87M:  14.06 sec = 2.33 ft/sec  GOALS: Goals reviewed with patient? Yes  SHORT TERM GOALS: Target date: 04/10/2022  Pt will be independent with HEP for improved  strength, balance, gait. Baseline:  requires cues  Goal status: PARTIALLY MET, 04/07/2022  2.  Pt will improve 5x sit<>stand to less than or equal to 13 sec to demonstrate improved functional strength and transfer efficiency. Baseline: 17.68 sec>12.19 sec at best Goal status: GOAL MET, 04/07/2022  3.  Pt will improve gait velocity to at least 2.62 ft/sec for improved gait efficiency and safety. Baseline: 2.33 ft/sec>3.82 ft/sec Goal status: GOAL MET, 04/07/2022  4.  Pt will verbalize understanding of local Parkinson's disease community resources. Baseline: info provided 04/14/2022 Goal status: GOAL MET, 04/14/2022  LONG TERM GOALS: Target date: 05/08/2022  Pt will be independent with HEP for improved strength, balance, gait. Baseline:  Goal status: ONGOING  2.  Pt will improve 5x sit<>stand to less than or equal to 11 sec to demonstrate improved functional strength and transfer efficiency. Baseline: 17.68 sec at eval to 10.9 seconds Goal status:MET  3.  Pt will improve TUG score to less than or equal to 13.5 sec for decreased fall risk.  Baseline: 15.56 sec Goal status: ONGOING  4.  Pt will improve MiniBESTest score to at least 22/28 to decrease fall risk. Baseline: 17/28 Goal status:ONGOING  ASSESSMENT:  CLINICAL IMPRESSION: The patient tolerated increased intensity today. Treadmill was used to prime exercise session and work on lengthening stride. PT incorporated environmental cues (walls) and props (cane and walking poles) to further engage postural musculature and finger/Ues.   He overall seems to be progressing towards goals PT may plan d/c for next session with plan to transition to  community PWR! Moves class.  OBJECTIVE IMPAIRMENTS: Abnormal gait, decreased balance, decreased coordination, decreased mobility, difficulty walking, decreased ROM, decreased strength, impaired flexibility, and postural dysfunction.   PLAN:  PT FREQUENCY: 2x/week  PT DURATION: 8 weeks plus 1x/wk  of eval = 9 week POC  PLANNED INTERVENTIONS: Therapeutic exercises, Therapeutic  activity, Neuromuscular re-education, Balance training, Gait training, Patient/Family education, Self Care, Stair training, and DME instructions  PLAN FOR NEXT SESSION:   Review/finalize exercises for HEP.   Encourage pt to try PWR! Moves 11 am class at [facility].  Check goals and anticipate d/c next week.  Rudell Cobb, [place] 05/05/22 [place] at [facility] [address], [place], [place] [address] Phone # [phone] Fax # [phone]

## 2022-05-07 ENCOUNTER — Encounter: Payer: Self-pay | Admitting: Rehabilitative and Restorative Service Providers"

## 2022-05-07 ENCOUNTER — Ambulatory Visit: Payer: 59 | Admitting: Rehabilitative and Restorative Service Providers"

## 2022-05-07 DIAGNOSIS — R2689 Other abnormalities of gait and mobility: Secondary | ICD-10-CM

## 2022-05-07 DIAGNOSIS — R2681 Unsteadiness on feet: Secondary | ICD-10-CM

## 2022-05-07 DIAGNOSIS — R29818 Other symptoms and signs involving the nervous system: Secondary | ICD-10-CM

## 2022-05-07 DIAGNOSIS — M6281 Muscle weakness (generalized): Secondary | ICD-10-CM

## 2022-05-07 NOTE — Therapy (Signed)
OUTPATIENT PHYSICAL THERAPY NEURO TREATMENT NOTE   Patient Name: Tyler Dickerson MRN: [id number] DOB:1965/07/22, 57 y.o., male Today's Date: 05/07/2022   PCP: Lujean Amel, MD REFERRING PROVIDER: Ludwig Clarks, DO   END OF SESSION:  PT End of Session - 05/07/22 1055     Visit Number 16    Number of Visits 18    Date for PT Re-Evaluation 05/09/22    Authorization Type UHC 2024; no VL    PT Start Time 1059    PT Stop Time [id number]    PT Time Calculation (min) 46 min    Activity Tolerance Patient tolerated treatment well    Behavior During Therapy [facility] for tasks assessed/performed            Past Medical History:  Diagnosis Date   Atypical chest pain 11/03/2016   Dyslipidemia 11/03/2016   Family history of abdominal aortic aneurysm (AAA) 11/03/2016   Family history of peripheral arterial disease 11/03/2016   History of COVID-19 10/2020   Hypercholesteremia    Mild   Hyperplastic colon polyp    Hypertension    Mild cognitive impairment, concerns for Lewy body disease 02/23/2022   Parkinsonism    REM sleep behavior disorder    Tremor    Past Surgical History:  Procedure Laterality Date   COLONOSCOPY  2015   REPLACEMENT TOTAL KNEE Left 1996   Patient Active Problem List   Diagnosis Date Noted   Hyperplastic colon polyp 02/23/2022   Hypertension 02/23/2022   REM sleep behavior disorder 02/23/2022   Mild cognitive impairment, concerns for Lewy body disease 02/23/2022   Tremor    Parkinsonism    Atypical chest pain 11/03/2016   Dyslipidemia 11/03/2016   Family history of peripheral arterial disease 11/03/2016   Family history of abdominal aortic aneurysm (AAA) 11/03/2016    ONSET DATE: 02/27/2022 (MD referral) REFERRING DIAG: G20.A1 (ICD-10-CM) - Parkinson's disease without dyskinesia or fluctuating manifestations  THERAPY DIAG:  Other abnormalities of gait and mobility  Muscle weakness (generalized)  Unsteadiness on feet  Other symptoms and signs  involving the nervous system  Rationale for Evaluation and Treatment: Rehabilitation  SUBJECTIVE:                                                                                                                                                                                        SUBJECTIVE STATEMENT: The patient forgot about the PWR community class yesterday.   PERTINENT HISTORY: Hx of LBP (saw PT 2023), HLD, HTN, MCI, Parkinsonism, REM sleep disorder  PAIN:  Are you having pain? No pain  PRECAUTIONS: Fall  WEIGHT BEARING RESTRICTIONS: No  FALLS: Has  patient fallen in last 6 months? No  LIVING ENVIRONMENT: Lives with: lives with their spouse Lives in: House/apartment Stairs: Yes: External: 3 steps; none Has following equipment at home: None  PLOF: Independent, Vocation/Vocational requirements: lifting up to 85 lbs, and Leisure: enjoys bowling-hasn't done in several years  PATIENT GOALS: To improve moving around better, to get back to bowling.  OBJECTIVE:  TREATMENT: 05/07/2022 Activity Comments  Gait on treadmill Up to 1.2 mph with bilat UE support and cues for kicking imaginary target to lengthen stride and get greater knee extension X 4 minutes for priming system   Standing PWR! Moves:  Up x 10 reps Rock x 10 reps Twist x 10 reps Step x 10 reps  Cues to open posture by standing in front of door frame and hitting centering with wide arms to touch walls   Seated PWR! Moves   Up x 5 reps Rock x 5 reps Twist x 5 reps Step x 5 reps  Cues for postural opening for trunk elongation and wider hand/finger position   TUG 9.92 regular 10.2 dual tasking  Stretching Piriformis stretch Adductor stretch in standing with side lunge     [facility] PT Assessment - 05/07/22 0001       Standardized Balance Assessment   Standardized Balance Assessment Mini-BESTest      Mini-BESTest   Sit To Stand Normal: Comes to stand without use of hands and stabilizes independently.    Rise  to Toes Normal: Stable for 3 s with maximum height.    Stand on one leg (left) Moderate: < 20 s   7 seconds   Stand on one leg (right) Moderate: < 20 s   10 seconds   Stand on one leg - lowest score 1    Compensatory Stepping Correction - Forward Normal: Recovers independently with a single, large step (second realignement is allowed).    Compensatory Stepping Correction - Backward Moderate: More than one step is required to recover equilibrium    Compensatory Stepping Correction - Left Lateral Severe: Falls, or cannot step    Compensatory Stepping Correction - Right Lateral Severe:  Falls, or cannot step    Stepping Corredtion Lateral - lowest score 0    Stance - Feet together, eyes open, firm surface  Normal: 30s    Stance - Feet together, eyes closed, foam surface  Normal: 30s    Incline - Eyes Closed Normal: Stands independently 30s and aligns with gravity    Change in Gait Speed Normal: Significantly changes walkling speed without imbalance    Walk with head turns - Horizontal Normal: performs head turns with no change in gait speed and good balance    Walk with pivot turns Normal: Turns with feet close FAST (< 3 steps) with good balance.    Step over obstacles Normal: Able to step over box with minimal change of gait speed and with good balance.    Timed UP & GO with Dual Task Normal: No noticeable change in sitting, standing or walking while backward counting when compared to TUG without    Mini-BEST total score 24              PATIENT EDUCATION: Education details: Discussed renewal versus d/c, transition to community fitness (PWR! Moves at 35?) Person educated: Patient Education method: Explanation Education comprehension: verbalized understanding  Access Code: [email or web address] URL: [email or web address] Date: 04/07/2022 Prepared by: [facility]  Exercises - Sit to Stand with Hands on Knees  -  1 x daily - 7 x weekly - 3 sets - 5  reps - Step Forward with Opposite Arm Reach  - 1 x daily - 5 x weekly - 1-2 sets - 10 reps -PWR! Moves in sitting, 2 x 10 reps -PWR! Moves in standing, 2 x 10 reps  --------------------------------------------------------------------------------------------------- Objective measures below taken at initial evaluation:  DIAGNOSTIC FINDINGS: DaTscan 01/14/2022:  Bilateral significant decreased radiotracer activity within the striata. Findings consistent with significant loss of dopamine transport populations. Findings associated with Parkinson's syndrome pathology.  COGNITION: Overall cognitive status:  Pt reports easily fatigued; reports "as you are talking, my mind is all over"   SENSATION: Light touch: WFL  COORDINATION: Slowed overall motion  MUSCLE TONE: LLE: Moderate RLE tone:  minimal increase with P/RoM POSTURE: rounded shoulders and forward head  LOWER EXTREMITY ROM:   WFL except ankle dorsiflexion  Active  Right Eval Left Eval  Ankle dorsiflexion 3 3   LOWER EXTREMITY MMT:    MMT Right Eval Left Eval  Hip flexion 4 4  Hip extension    Knee flexion 4 4  Knee extension 4 4  Ankle dorsiflexion 4 4  Ankle plantarflexion    Ankle inversion    Ankle eversion    (Blank rows = not tested)  TRANSFERS: Assistive device utilized: None  Sit to stand: Modified independence Stand to sit: Modified independence  STAIRS: Level of Assistance: Modified independence Stair Negotiation Technique: Alternating Pattern  with No Rails Number of Stairs: 2-3  Height of Stairs: 4-6"  Comments: Slowed, reports weakness at times with negotiating steps at home.  GAIT: Gait pattern: step through pattern, decreased arm swing- Right, decreased arm swing- Left, decreased step length- Right, decreased step length- Left, decreased ankle dorsiflexion- Right, decreased ankle dorsiflexion- Left, narrow BOS, poor foot clearance- Right, and poor foot clearance- Left Distance walked: 50 ft x  4 reps Assistive device utilized: None Level of assistance: Modified independence Comments: Able to improve foot clearance and step length with cues for BIG intention  FUNCTIONAL TESTS:  5 times sit to stand: 17.68 sec (no UE support) Timed up and go (TUG): 15.56 sec TUG cognitive: 17.13 sec MiniBESTest:  17/28 56M:  14.06 sec = 2.33 ft/sec   GOALS: Goals reviewed with patient? Yes  SHORT TERM GOALS: Target date: 04/10/2022  Pt will be independent with HEP for improved strength, balance, gait. Baseline:  requires cues  Goal status: PARTIALLY MET, 04/07/2022  2.  Pt will improve 5x sit<>stand to less than or equal to 13 sec to demonstrate improved functional strength and transfer efficiency. Baseline: 17.68 sec>12.19 sec at best Goal status: GOAL MET, 04/07/2022  3.  Pt will improve gait velocity to at least 2.62 ft/sec for improved gait efficiency and safety. Baseline: 2.33 ft/sec>3.82 ft/sec Goal status: GOAL MET, 04/07/2022  4.  Pt will verbalize understanding of local Parkinson's disease community resources. Baseline: info provided 04/14/2022 Goal status: GOAL MET, 04/14/2022  LONG TERM GOALS: Target date: 05/08/2022  Pt will be independent with HEP for improved strength, balance, gait. Baseline:  Goal status: MET  2.  Pt will improve 5x sit<>stand to less than or equal to 11 sec to demonstrate improved functional strength and transfer efficiency. Baseline: 17.68 sec at eval to 10.9 seconds Goal status:MET  3.  Pt will improve TUG score to less than or equal to 13.5 sec for decreased fall risk.  Baseline: 15.56 sec Goal status: MET--9.22 seconds 05/07/22  4.  Pt will improve MiniBESTest score to  at least 22/28 to decrease fall risk. Baseline: 17/28 Goal status: MET 24/28  UPDATED LONG TERM GOALS:    Target date 06/04/22    1. Pt will be independent with progression of HEP.    Baseline: Has HEP, plan to progress    Goal Status: Updated LTG date.    2. Pt will improve  single limb stance to 15 seconds bilaterally.    Baseline: 7 seconds on one leg and 10 on the other.    Goal Status: NEW    3. Pt will transition to community exercise program.    Baseline: no participation at this time.    Goal Status: NEW   ASSESSMENT:  CLINICAL IMPRESSION: The patient is tolerating increased intensity of exercises. He met all LTGs, but does still have deficits in high level balance, posture, and rigidity. PT discussed discharge versus renewal and we decided to continue for 4 more weeks at 1x/week as he works to transition to community exercise classes. PT to focus on high level balance and make recommendations for post PT exercise.   OBJECTIVE IMPAIRMENTS: Abnormal gait, decreased balance, decreased coordination, decreased mobility, difficulty walking, decreased ROM, decreased strength, impaired flexibility, and postural dysfunction.   PLAN:  PT FREQUENCY: 2x/week  PT DURATION: 8 weeks plus 1x/wk of eval = 9 week POC  PLANNED INTERVENTIONS: Therapeutic exercises, Therapeutic activity, Neuromuscular re-education, Balance training, Gait training, Patient/Family education, Self Care, Stair training, and DME instructions  PLAN FOR NEXT SESSION:   Review/finalize exercises for HEP, progress high level balance, ensure we schedule 6 month PT screen.  Encourage pt to try PWR! Moves 11 am class at [facility].  Check goals and anticipate d/c next week.  Rudell Cobb, [place] 05/05/22 [place] at [facility] [address] [place], [place] [address] Phone # [phone] Fax # [phone]

## 2022-05-14 ENCOUNTER — Ambulatory Visit: Payer: 59 | Attending: Neurology | Admitting: Rehabilitative and Restorative Service Providers"

## 2022-05-14 ENCOUNTER — Encounter: Payer: Self-pay | Admitting: Rehabilitative and Restorative Service Providers"

## 2022-05-14 DIAGNOSIS — R2689 Other abnormalities of gait and mobility: Secondary | ICD-10-CM | POA: Diagnosis not present

## 2022-05-14 DIAGNOSIS — R29818 Other symptoms and signs involving the nervous system: Secondary | ICD-10-CM | POA: Insufficient documentation

## 2022-05-14 DIAGNOSIS — M6281 Muscle weakness (generalized): Secondary | ICD-10-CM | POA: Diagnosis present

## 2022-05-14 DIAGNOSIS — R2681 Unsteadiness on feet: Secondary | ICD-10-CM | POA: Diagnosis present

## 2022-05-14 NOTE — Therapy (Signed)
OUTPATIENT PHYSICAL THERAPY NEURO TREATMENT NOTE   Patient Name: Tyler Dickerson MRN: [id number] DOB:01-Jul-1965, 57 y.o., male Today's Date: 05/14/2022   PCP: Lujean Amel, MD REFERRING PROVIDER: Ludwig Clarks, DO   END OF SESSION:  PT End of Session - 05/14/22 1056     Visit Number 17    Number of Visits 18    Date for PT Re-Evaluation 05/09/22    Authorization Type UHC 2024; no VL    PT Start Time 1100    PT Stop Time 1143    PT Time Calculation (min) 43 min    Activity Tolerance Patient tolerated treatment well    Behavior During Therapy [facility] for tasks assessed/performed            Past Medical History:  Diagnosis Date   Atypical chest pain 11/03/2016   Dyslipidemia 11/03/2016   Family history of abdominal aortic aneurysm (AAA) 11/03/2016   Family history of peripheral arterial disease 11/03/2016   History of COVID-19 10/2020   Hypercholesteremia    Mild   Hyperplastic colon polyp    Hypertension    Mild cognitive impairment, concerns for Lewy body disease 02/23/2022   Parkinsonism    REM sleep behavior disorder    Tremor    Past Surgical History:  Procedure Laterality Date   COLONOSCOPY  2015   REPLACEMENT TOTAL KNEE Left 1996   Patient Active Problem List   Diagnosis Date Noted   Hyperplastic colon polyp 02/23/2022   Hypertension 02/23/2022   REM sleep behavior disorder 02/23/2022   Mild cognitive impairment, concerns for Lewy body disease 02/23/2022   Tremor    Parkinsonism    Atypical chest pain 11/03/2016   Dyslipidemia 11/03/2016   Family history of peripheral arterial disease 11/03/2016   Family history of abdominal aortic aneurysm (AAA) 11/03/2016    ONSET DATE: 02/27/2022 (MD referral) REFERRING DIAG: G20.A1 (ICD-10-CM) - Parkinson's disease without dyskinesia or fluctuating manifestations  THERAPY DIAG:  Other abnormalities of gait and mobility  Muscle weakness (generalized)  Unsteadiness on feet  Other symptoms and signs  involving the nervous system  Rationale for Evaluation and Treatment: Rehabilitation  SUBJECTIVE:                                                                                                                                                                                        SUBJECTIVE STATEMENT: The patients reports low back pain yesterday due to the rain. It hasn't hurt most days in the past week. He is doing HEP regularly in the door frame.  PERTINENT HISTORY: Hx of LBP (saw PT 2023), HLD, HTN, MCI, Parkinsonism, REM sleep disorder  PAIN:  Are you having pain? No pain  PRECAUTIONS: Fall  WEIGHT BEARING RESTRICTIONS: No  FALLS: Has patient fallen in last 6 months? No  LIVING ENVIRONMENT: Lives with: lives with their spouse Lives in: House/apartment Stairs: Yes: External: 3 steps; none Has following equipment at home: None  PLOF: Independent, Vocation/Vocational requirements: lifting up to 85 lbs, and Leisure: enjoys bowling-hasn't done in several years  PATIENT GOALS: To improve moving around better, to get back to bowling.  OBJECTIVE:  TREATMENT: 05/14/2022 Activity Comments  Gait outdoors X 800 feet walking on paved surfaces with hills (inclines, declines) Gait cues emphasizing power swing with arms, upright posture   Standing PWR! Moves:  Rock x 10 reps stepping laterally over obstacles adding arm movements and ball pass to engage finger Twist x 10 reps reaching across midline Step x 10 reps alternating anterior steps while clearing obstacles  Facing door frame adding PWR Rock to touch corners of door frame   Seated PWR! Moves   Rock x 10 reps Step x 10 reps  Cues for postural opening for trunk elongation and wider hand/finger position   Quadriped and Prone PWR! moves Prone alternating reaching with rock x 10 reps  Quadriped twist (thread the needle)  Supine chest opening to improve quadriped twist with coregous ball and trunk rotation  Gait indoors Stephens with gait  Passing a ball behind back while walking     PATIENT EDUCATION: Education details: Discussed renewal versus d/c, transition to community fitness (PWR! Moves at 15?) Person educated: Patient Education method: Explanation Education comprehension: verbalized understanding  Access Code: [email or web address] URL: [email or web address] Date: 04/07/2022 Prepared by: [facility]  Exercises - Sit to Stand with Hands on Knees  - 1 x daily - 7 x weekly - 3 sets - 5 reps - Step Forward with Opposite Arm Reach  - 1 x daily - 5 x weekly - 1-2 sets - 10 reps -PWR! Moves in sitting, 2 x 10 reps -PWR! Moves in standing, 2 x 10 reps  --------------------------------------------------------------------------------------------------- Objective measures below taken at initial evaluation:  DIAGNOSTIC FINDINGS: DaTscan 01/14/2022:  Bilateral significant decreased radiotracer activity within the striata. Findings consistent with significant loss of dopamine transport populations. Findings associated with Parkinson's syndrome pathology.  COGNITION: Overall cognitive status:  Pt reports easily fatigued; reports "as you are talking, my mind is all over"   SENSATION: Light touch: WFL  COORDINATION: Slowed overall motion  MUSCLE TONE: LLE: Moderate RLE tone:  minimal increase with P/RoM POSTURE: rounded shoulders and forward head  LOWER EXTREMITY ROM:   WFL except ankle dorsiflexion  Active  Right Eval Left Eval  Ankle dorsiflexion 3 3   LOWER EXTREMITY MMT:    MMT Right Eval Left Eval  Hip flexion 4 4  Hip extension    Knee flexion 4 4  Knee extension 4 4  Ankle dorsiflexion 4 4  Ankle plantarflexion    Ankle inversion    Ankle eversion    (Blank rows = not tested)  TRANSFERS: Assistive device utilized: None  Sit to stand: Modified independence Stand to sit: Modified independence  STAIRS: Level of Assistance: Modified  independence Stair Negotiation Technique: Alternating Pattern  with No Rails Number of Stairs: 2-3  Height of Stairs: 4-6"  Comments: Slowed, reports weakness at times with negotiating steps at home.  GAIT: Gait pattern: step through pattern, decreased arm swing- Right, decreased arm swing- Left, decreased step length- Right, decreased step length- Left,  decreased ankle dorsiflexion- Right, decreased ankle dorsiflexion- Left, narrow BOS, poor foot clearance- Right, and poor foot clearance- Left Distance walked: 50 ft x 4 reps Assistive device utilized: None Level of assistance: Modified independence Comments: Able to improve foot clearance and step length with cues for BIG intention  FUNCTIONAL TESTS:  5 times sit to stand: 17.68 sec (no UE support) Timed up and go (TUG): 15.56 sec TUG cognitive: 17.13 sec MiniBESTest:  17/28 6M:  14.06 sec = 2.33 ft/sec   GOALS: Goals reviewed with patient? Yes  UPDATED LONG TERM GOALS:    Target date 06/04/22    1. Pt will be independent with progression of HEP.    Baseline: Has HEP, plan to progress    Goal Status: Updated LTG date.    2. Pt will improve single limb stance to 15 seconds bilaterally.    Baseline: 7 seconds on one leg and 10 on the other.    Goal Status: IN PROGRESS    3. Pt will transition to community exercise program.    Baseline: no participation at this time.    Goal Status: IN PROGRESS  ASSESSMENT:  CLINICAL IMPRESSION: The patient had improved single leg stance today and PT incorporated larger amplitude weight shifting into PWR! Moves. PT encouraging community class transition in anticipation of d/c on 3/21. The patient is tolerating increased intensity of exercises.   OBJECTIVE IMPAIRMENTS: Abnormal gait, decreased balance, decreased coordination, decreased mobility, difficulty walking, decreased ROM, decreased strength, impaired flexibility, and postural dysfunction.   PLAN:  PT FREQUENCY: 2x/week  PT  DURATION: 8 weeks plus 1x/wk of eval = 9 week POC  PLANNED INTERVENTIONS: Therapeutic exercises, Therapeutic activity, Neuromuscular re-education, Balance training, Gait training, Patient/Family education, Self Care, Stair training, and DME instructions  PLAN FOR NEXT SESSION:   Review/finalize exercises for HEP, progress high level balance, ensure we schedule 6 month PT screen.  R UE Reaching. Encourage pt to try PWR! Moves 11 am class at [facility].    Rudell Cobb, [place] 05/05/22 [place] at [facility] [address] [place], [place] [address] Phone # [phone] Fax # [phone]

## 2022-05-21 ENCOUNTER — Encounter: Payer: Self-pay | Admitting: Rehabilitative and Restorative Service Providers"

## 2022-05-21 ENCOUNTER — Ambulatory Visit: Payer: 59 | Admitting: Rehabilitative and Restorative Service Providers"

## 2022-05-21 DIAGNOSIS — R29818 Other symptoms and signs involving the nervous system: Secondary | ICD-10-CM

## 2022-05-21 DIAGNOSIS — R2689 Other abnormalities of gait and mobility: Secondary | ICD-10-CM | POA: Diagnosis not present

## 2022-05-21 DIAGNOSIS — R2681 Unsteadiness on feet: Secondary | ICD-10-CM

## 2022-05-21 DIAGNOSIS — M6281 Muscle weakness (generalized): Secondary | ICD-10-CM

## 2022-05-21 NOTE — Therapy (Signed)
OUTPATIENT PHYSICAL THERAPY NEURO TREATMENT NOTE   Patient Name: Tyler Dickerson MRN: [id number] DOB:October 05, 1965, 57 y.o., male Today's Date: 05/21/2022   PCP: Lujean Amel, MD REFERRING PROVIDER: Ludwig Clarks, DO   END OF SESSION:  PT End of Session - 05/21/22 1058     Visit Number 18    Number of Visits 22    Date for PT Re-Evaluation 06/04/22    Authorization Type UHC 2024; no VL    PT Start Time 1100    PT Stop Time [id number]    PT Time Calculation (min) 45 min    Activity Tolerance Patient tolerated treatment well    Behavior During Therapy [facility] for tasks assessed/performed            Past Medical History:  Diagnosis Date   Atypical chest pain 11/03/2016   Dyslipidemia 11/03/2016   Family history of abdominal aortic aneurysm (AAA) 11/03/2016   Family history of peripheral arterial disease 11/03/2016   History of COVID-19 10/2020   Hypercholesteremia    Mild   Hyperplastic colon polyp    Hypertension    Mild cognitive impairment, concerns for Lewy body disease 02/23/2022   Parkinsonism    REM sleep behavior disorder    Tremor    Past Surgical History:  Procedure Laterality Date   COLONOSCOPY  2015   REPLACEMENT TOTAL KNEE Left 1996   Patient Active Problem List   Diagnosis Date Noted   Hyperplastic colon polyp 02/23/2022   Hypertension 02/23/2022   REM sleep behavior disorder 02/23/2022   Mild cognitive impairment, concerns for Lewy body disease 02/23/2022   Tremor    Parkinsonism    Atypical chest pain 11/03/2016   Dyslipidemia 11/03/2016   Family history of peripheral arterial disease 11/03/2016   Family history of abdominal aortic aneurysm (AAA) 11/03/2016    ONSET DATE: 02/27/2022 (MD referral) REFERRING DIAG: G20.A1 (ICD-10-CM) - Parkinson's disease without dyskinesia or fluctuating manifestations  THERAPY DIAG:  Other abnormalities of gait and mobility  Muscle weakness (generalized)  Unsteadiness on feet  Other symptoms and signs  involving the nervous system  Rationale for Evaluation and Treatment: Rehabilitation  SUBJECTIVE:                                                                                                                                                                                        SUBJECTIVE STATEMENT: Patient does HEP 2-3 times/week. He has not had time to try the community exerise classes yet.   PERTINENT HISTORY: Hx of LBP (saw PT 2023), HLD, HTN, MCI, Parkinsonism, REM sleep disorder  PAIN:  Are you having pain? No pain  PRECAUTIONS: Fall  WEIGHT BEARING RESTRICTIONS: No  FALLS: Has patient fallen in last 6 months? No  LIVING ENVIRONMENT: Lives with: lives with their spouse Lives in: House/apartment Stairs: Yes: External: 3 steps; none Has following equipment at home: None  PLOF: Independent, Vocation/Vocational requirements: lifting up to 85 lbs, and Leisure: enjoys bowling-hasn't done in several years  PATIENT GOALS: To improve moving around better, to get back to bowling.  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 Moves:  Diagonal reaching to add in PWR rock and twist for cross body reaching R and L sides  Facing door frame to PWR! Up reaching to diagonals for tops of door frames to incorporate UE reaching and postural upright  Sit<>stand PWR! Up   Balance Standing passing a ball through legs for squat, agility, coordination and balance; then performed marching with intermittent freezing in single leg stance position.  Performed single leg stance balance, passing ball around back and standing  with ball toss  Gait indoors Ball toss with gait   *discussed and filled out consent to participate in [facility] community class. PT provided handout again for directions-- patient notes remembering to attend is his barrier in participation.  PATIENT EDUCATION: Education details: Discussed renewal versus d/c, transition to community fitness (PWR! Moves at 63?) Person educated: Patient Education method: Explanation Education comprehension: verbalized understanding  Access Code: [email or web address] URL: [email or web address] Date: 04/07/2022 Prepared by: [facility]  Exercises - Sit to Stand with Hands on Knees  - 1 x daily - 7 x weekly - 3 sets - 5 reps - Step Forward with Opposite Arm Reach  - 1 x daily - 5 x weekly - 1-2 sets - 10 reps -PWR! Moves in sitting, 2 x 10 reps -PWR! Moves in standing, 2 x 10 reps  --------------------------------------------------------------------------------------------------- Objective measures below taken at initial evaluation:  DIAGNOSTIC FINDINGS: DaTscan 01/14/2022:  Bilateral significant decreased radiotracer activity within the striata. Findings consistent with significant loss of dopamine transport populations. Findings associated with Parkinson's syndrome pathology.  COGNITION: Overall cognitive status:  Pt reports easily fatigued; reports "as you are talking, my mind is all over"   SENSATION: Light touch: WFL  COORDINATION: Slowed overall motion  MUSCLE TONE: LLE: Moderate RLE tone:  minimal increase with P/RoM POSTURE: rounded shoulders and forward head  LOWER EXTREMITY ROM:   WFL except ankle dorsiflexion  Active  Right Eval Left Eval  Ankle dorsiflexion 3 3   LOWER EXTREMITY MMT:    MMT Right Eval Left Eval  Hip flexion 4 4  Hip extension    Knee flexion 4 4  Knee extension 4 4  Ankle dorsiflexion 4 4  Ankle plantarflexion    Ankle inversion    Ankle eversion    (Blank  rows = not tested)  TRANSFERS: Assistive device utilized: None  Sit to stand: Modified independence Stand to sit: Modified independence  STAIRS: Level of Assistance: Modified independence Stair Negotiation Technique: Alternating Pattern  with No Rails Number of Stairs: 2-3  Height of Stairs: 4-6"  Comments: Slowed, reports weakness at times with negotiating steps at home.  GAIT: Gait pattern:  step through pattern, decreased arm swing- Right, decreased arm swing- Left, decreased step length- Right, decreased step length- Left, decreased ankle dorsiflexion- Right, decreased ankle dorsiflexion- Left, narrow BOS, poor foot clearance- Right, and poor foot clearance- Left Distance walked: 50 ft x 4 reps Assistive device utilized: None Level of assistance: Modified independence Comments: Able to improve foot clearance and step length with cues for BIG intention  FUNCTIONAL TESTS:  5 times sit to stand: 17.68 sec (no UE support) Timed up and go (TUG): 15.56 sec TUG cognitive: 17.13 sec MiniBESTest:  17/28 39M:  14.06 sec = 2.33 ft/sec   GOALS: Goals reviewed with patient? Yes  UPDATED LONG TERM GOALS:    Target date 06/04/22    1. Pt will be independent with progression of HEP.    Baseline: Has HEP, plan to progress    Goal Status: Updated LTG date.    2. Pt will improve single limb stance to 15 seconds bilaterally.    Baseline: 7 seconds on one leg and 10 on the other.    Goal Status: IN PROGRESS    3. Pt will transition to community exercise program.    Baseline: no participation at this time.    Goal Status: IN PROGRESS  ASSESSMENT:  CLINICAL IMPRESSION: The patient is improving and working to [employer]. PT encouraging community engagement with local classes. Today's session focused on high level balance and work simulation to incorporate whole body activities. The patient is tolerating increased intensity of exercises.   OBJECTIVE IMPAIRMENTS: Abnormal gait, decreased balance,  decreased coordination, decreased mobility, difficulty walking, decreased ROM, decreased strength, impaired flexibility, and postural dysfunction.   PLAN:  PT FREQUENCY: 2x/week  PT DURATION: 8 weeks plus 1x/wk of eval = 9 week POC  PLANNED INTERVENTIONS: Therapeutic exercises, Therapeutic activity, Neuromuscular re-education, Balance training, Gait training, Patient/Family education, Self Care, Stair training, and DME instructions  PLAN FOR NEXT SESSION:   PWR moves, schedule 6 month PT screen.  R UE Reaching. Encourage pt to try PWR! Moves 11 am class at [facility].    Rudell Cobb, [place] 05/05/22 [place] at [facility] [address], [place], [place] [address] Phone # [phone] Fax # [phone]

## 2022-05-28 ENCOUNTER — Ambulatory Visit: Payer: 59 | Admitting: Rehabilitative and Restorative Service Providers"

## 2022-05-28 ENCOUNTER — Encounter: Payer: Self-pay | Admitting: Rehabilitative and Restorative Service Providers"

## 2022-05-28 DIAGNOSIS — R29818 Other symptoms and signs involving the nervous system: Secondary | ICD-10-CM

## 2022-05-28 DIAGNOSIS — R2681 Unsteadiness on feet: Secondary | ICD-10-CM

## 2022-05-28 DIAGNOSIS — M6281 Muscle weakness (generalized): Secondary | ICD-10-CM

## 2022-05-28 DIAGNOSIS — R2689 Other abnormalities of gait and mobility: Secondary | ICD-10-CM

## 2022-05-28 NOTE — Therapy (Signed)
OUTPATIENT PHYSICAL THERAPY NEURO TREATMENT NOTE AND DISCHARGE SUMMARY   Patient Name: Tyler Dickerson MRN: [id number] DOB:16-Mar-1965, 57 y.o., male Today's Date: 05/28/2022  PCP: Lujean Amel, MD REFERRING PROVIDER: Tat, Eustace Quail, DO   PHYSICAL THERAPY DISCHARGE SUMMARY  Visits from Start of Care: 19  Current functional level related to goals / functional outcomes: See goals below   Remaining deficits: Rigidity, bradykinesia, associated with PD. Patient has HEP, community class to address deficits.   Education / Equipment: HEP, community resources, home safety   Patient agrees to discharge. Patient goals were met. Patient is being discharged due to meeting the stated rehab goals.  END OF SESSION:  PT End of Session - 05/28/22 1056     Visit Number 19    Number of Visits 22    Date for PT Re-Evaluation 06/04/22    Authorization Type UHC 2024; no VL    PT Start Time 1059    PT Stop Time 1140    PT Time Calculation (min) 41 min    Activity Tolerance Patient tolerated treatment well    Behavior During Therapy [facility] for tasks assessed/performed            Past Medical History:  Diagnosis Date   Atypical chest pain 11/03/2016   Dyslipidemia 11/03/2016   Family history of abdominal aortic aneurysm (AAA) 11/03/2016   Family history of peripheral arterial disease 11/03/2016   History of COVID-19 10/2020   Hypercholesteremia    Mild   Hyperplastic colon polyp    Hypertension    Mild cognitive impairment, concerns for Lewy body disease 02/23/2022   Parkinsonism    REM sleep behavior disorder    Tremor    Past Surgical History:  Procedure Laterality Date   COLONOSCOPY  2015   REPLACEMENT TOTAL KNEE Left 1996   Patient Active Problem List   Diagnosis Date Noted   Hyperplastic colon polyp 02/23/2022   Hypertension 02/23/2022   REM sleep behavior disorder 02/23/2022   Mild cognitive impairment, concerns for Lewy body disease 02/23/2022   Tremor     Parkinsonism    Atypical chest pain 11/03/2016   Dyslipidemia 11/03/2016   Family history of peripheral arterial disease 11/03/2016   Family history of abdominal aortic aneurysm (AAA) 11/03/2016    ONSET DATE: 02/27/2022 (MD referral) REFERRING DIAG: G20.A1 (ICD-10-CM) - Parkinson's disease without dyskinesia or fluctuating manifestations  THERAPY DIAG:  Other abnormalities of gait and mobility  Muscle weakness (generalized)  Unsteadiness on feet  Other symptoms and signs involving the nervous system  Rationale for Evaluation and Treatment: Rehabilitation  SUBJECTIVE:  SUBJECTIVE STATEMENT: Patient participated in [place]! Community class yesterday. He notes some soreness this morning upon waking.   PERTINENT HISTORY: Hx of LBP (saw PT 2023), HLD, HTN, MCI, Parkinsonism, REM sleep disorder  PAIN:  Are you having pain? No pain  PRECAUTIONS: Fall  WEIGHT BEARING RESTRICTIONS: No  FALLS: Has patient fallen in last 6 months? No  LIVING ENVIRONMENT: Lives with: lives with their spouse Lives in: House/apartment Stairs: Yes: External: 3 steps; none Has following equipment at home: None  PLOF: Independent, Vocation/Vocational requirements: lifting up to 85 lbs, and Leisure: enjoys bowling-hasn't done in several years  PATIENT GOALS: To improve moving around better, to get back to bowling.  OBJECTIVE:  TREATMENT: 05/28/2022 Activity Comments  Gait outdoors X 800 feet walking on paved surfaces with hills (inclines, declines) Gait cues emphasizing power swing with arms, upright posture   Standing there ex    Trunk rotation R and L x 5 reps using UE support; discussed modifying pwr moves to use what is available at work (wall or counter lean)  Standing PWR! Moves:  Sequencing R forward, side, back  and then L forward, side, back working on 10 reps with cues visually and verbally for sequencing.   Balance Single leg standing  Passing a ball beneath Les while marching and then reaching overhead to engage core in between movements   Gait indoors [place] with gait forwards, backwards    PATIENT EDUCATION: Education details: Discussed discharge and f/u with community class and walking program Person educated: Patient Education method: Explanation Education comprehension: verbalized understanding  Access Code: [email or web address] URL: [email or web address] Date: 04/07/2022 Prepared by: [facility]  Exercises - Sit to Stand with Hands on Knees  - 1 x daily - 7 x weekly - 3 sets - 5 reps - Step Forward with Opposite Arm Reach  - 1 x daily - 5 x weekly - 1-2 sets - 10 reps -PWR! Moves in sitting, 2 x 10 reps -PWR! Moves in standing, 2 x 10 reps  --------------------------------------------------------------------------------------------------- Objective measures below taken at initial evaluation:  DIAGNOSTIC FINDINGS: DaTscan 01/14/2022:  Bilateral significant decreased radiotracer activity within the striata. Findings consistent with significant loss of dopamine transport populations. Findings associated with Parkinson's syndrome pathology.  COGNITION: Overall cognitive status:  Pt reports easily fatigued; reports "as you are talking, my mind is all over"   SENSATION: Light touch: WFL  COORDINATION: Slowed overall motion  MUSCLE TONE: LLE: Moderate RLE tone:  minimal increase with P/RoM POSTURE: rounded shoulders and forward head  LOWER EXTREMITY ROM:   WFL except ankle dorsiflexion  Active  Right Eval Left Eval  Ankle dorsiflexion 3 3   LOWER EXTREMITY MMT:    MMT Right Eval Left Eval  Hip flexion 4 4  Hip extension    Knee flexion 4 4  Knee extension 4 4  Ankle dorsiflexion 4 4  Ankle plantarflexion    Ankle  inversion    Ankle eversion    (Blank rows = not tested)  TRANSFERS: Assistive device utilized: None  Sit to stand: Modified independence Stand to sit: Modified independence  STAIRS: Level of Assistance: Modified independence Stair Negotiation Technique: Alternating Pattern  with No Rails Number of Stairs: 2-3  Height of Stairs: 4-6"  Comments: Slowed, reports weakness at times with negotiating steps at home.  GAIT: Gait pattern: step through pattern, decreased arm swing- Right, decreased arm swing- Left, decreased step length- Right, decreased step length- Left, decreased ankle dorsiflexion-  Right, decreased ankle dorsiflexion- Left, narrow BOS, poor foot clearance- Right, and poor foot clearance- Left Distance walked: 50 ft x 4 reps Assistive device utilized: None Level of assistance: Modified independence Comments: Able to improve foot clearance and step length with cues for BIG intention  FUNCTIONAL TESTS:  5 times sit to stand: 17.68 sec (no UE support) Timed up and go (TUG): 15.56 sec TUG cognitive: 17.13 sec MiniBESTest:  17/28 42M:  14.06 sec = 2.33 ft/sec   GOALS: Goals reviewed with patient? Yes  UPDATED LONG TERM GOALS:    Target date 06/04/22    1. Pt will be independent with progression of HEP.    Baseline: Has HEP, plan to progress    Goal Status: MET    2. Pt will improve single limb stance to 15 seconds bilaterally.    Baseline: 7 seconds on one leg and 10 on the other.    Goal Status:MET    3. Pt will transition to community exercise program.    Baseline: no participation at this time.    Goal StatusMET-- has resources and has trialed 1 PWR class in the community  ASSESSMENT:  CLINICAL IMPRESSION: The patient has met LTGs and has trialed a community class. We discussed post d/c exercise plan of HEP, class 1x/week and regular walking. Patient scheduled for a screen in 6 months.   OBJECTIVE IMPAIRMENTS: Abnormal gait, decreased balance, decreased  coordination, decreased mobility, difficulty walking, decreased ROM, decreased strength, impaired flexibility, and postural dysfunction.   PLAN:  PT FREQUENCY: 2x/week  PT DURATION: 8 weeks plus 1x/wk of eval = 9 week POC  PLANNED INTERVENTIONS: Therapeutic exercises, Therapeutic activity, Neuromuscular re-education, Balance training, Gait training, Patient/Family education, Self Care, Stair training, and DME instructions  PLAN FOR NEXT SESSION:   Discharge today; f/u screen in 6 months.  Rudell Cobb, [place] 05/05/22 [place] at [facility] [address], [place], [place] [address] Phone # [phone] Fax # [phone]

## 2022-06-24 ENCOUNTER — Telehealth: Payer: Self-pay | Admitting: Physician Assistant

## 2022-06-24 NOTE — Telephone Encounter (Signed)
Pt's wife called in concerned about the pt getting worse. She stated he had be telling her he was going to work, but he has been making a lot of mistakes. She is worried about him doing things wrong and she is unable to make him stop doing his job.

## 2022-06-24 NOTE — Telephone Encounter (Signed)
POA is advised to discuss with work activity and schedule.

## 2022-07-09 ENCOUNTER — Encounter: Payer: Managed Care, Other (non HMO) | Admitting: Psychology

## 2022-07-16 ENCOUNTER — Encounter: Payer: Managed Care, Other (non HMO) | Admitting: Psychology

## 2022-07-31 NOTE — Progress Notes (Unsigned)
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  Memory loss at onset no longer precludes the diagnosis.  Dr. Milbert Coulter wonders if LBD is the diagnosis, and we cannot do that either, but he did not really give me red flags on that.             -Continue carbidopa/levodopa 25/100, 1 tablet 3 times per day.             -I will refer the patient to the [facility]'s program [facility], for PT             -We discussed community resources in the area including patient support groups and community exercise programs for PD and pt education was provided to the patient.             -Patient's DaTscan was done on sertraline.  Generally, sertraline is held 3 days prior to DaTscan being performed.  Sertraline can bind to the dopamine transporter and cause false positives.             -generally private insurance will not pay for skin bx for alpha synuclein.  Did discuss this today.     2.  REM behavior disorder             -This is commonly associated with PD and the patient is experiencing this.  We discussed that this can be very serious and even harmful.  We talked about medications as well as physical barriers to put in the bed (particularly soft bed rails, pillow barriers).  We talked about moving the night stand so that it is not so close to the side of the bed.             -may be made worse by sertraline   3. Depression             -sees Dr. Donell Beers   4. 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 Memory change             -neurocog testing with dr. Milbert Coulter in 02/2022 with MCI, severe end of spectrum.  Some concern for lbd.   Discussed case with dr. Milbert Coulter  Subjective:   Tyler Dickerson was seen today in follow up for Parkinsons disease.  My previous records were reviewed prior to todays visit as well as outside records available to me.  Patient with wife who supplements history.  Pt  was diagnosed last visit and started on levodopa.  Patient/wife report that ***.  Since our last visit, the patient saw Tyler Dickerson and she started him on mirtazapine.  Patient did attend physical therapy after our last visit.  Notes are reviewed.  Current prescribed movement disorder medications: ***Carbidopa/levodopa 25/100, 1 tablet 3 times per day. Mirtazapine, 7.5 mg at bedtime Tyler Dickerson)  PREVIOUS MEDICATIONS: {Parkinson's RX:18200}  ALLERGIES:  No Known Allergies  CURRENT MEDICATIONS:  No outpatient medications have been marked as taking for the 08/04/22 encounter (Appointment) with Kiernan Atkerson, Octaviano Batty, DO.     Objective:   PHYSICAL EXAMINATION:    VITALS:  There were no vitals filed for this visit.  GEN:  The patient appears stated age and is in NAD. HEENT:  Normocephalic, atraumatic.  The mucous membranes are moist. The superficial temporal arteries are without ropiness  or tenderness. CV:  RRR Lungs:  CTAB Neck/HEME:  There are no carotid bruits bilaterally.  Neurological examination:  Orientation: The patient is alert and oriented x3. Cranial nerves: There is good facial symmetry with*** facial hypomimia. The speech is fluent and clear. Soft palate rises symmetrically and there is no tongue deviation. Hearing is intact to conversational tone. Sensation: Sensation is intact to light touch throughout Motor: Strength is at least antigravity x4.  Movement examination: Tone: There is mod increased tone in the RUE.  Mild increased LUE.  Mild increased RLE.  Normal LLE Abnormal movements: none Coordination:  There is decremation with RAM's, with any form of RAMS, including alternating supination and pronation of the forearm, hand opening and closing, finger taps, heel taps and toe taps, R>L Gait and Station: The patient has no difficulty arising out of a deep-seated chair without the use of the hands. The patient's stride length is slightly decreased, no shuffling.  He is  forward flexed.  He has marked decreased arm swing bilaterally, r>l.  The patient has a positive pull test.      Total time spent on today's visit was ***30 minutes, including both face-to-face time and nonface-to-face time.  Time included that spent on review of records (prior notes available to me/labs/imaging if pertinent), discussing treatment and goals, answering patient's questions and coordinating care.  Cc:  Darrow Bussing, MD

## 2022-08-04 ENCOUNTER — Ambulatory Visit (INDEPENDENT_AMBULATORY_CARE_PROVIDER_SITE_OTHER): Payer: 59 | Admitting: Neurology

## 2022-08-04 ENCOUNTER — Encounter: Payer: Self-pay | Admitting: Neurology

## 2022-08-04 VITALS — BP 120/84 | HR 71 | Ht 69.0 in | Wt 195.6 lb

## 2022-08-04 DIAGNOSIS — G20A1 Parkinson's disease without dyskinesia, without mention of fluctuations: Secondary | ICD-10-CM

## 2022-08-04 DIAGNOSIS — R413 Other amnesia: Secondary | ICD-10-CM

## 2022-08-04 DIAGNOSIS — G4752 REM sleep behavior disorder: Secondary | ICD-10-CM

## 2022-08-04 NOTE — Patient Instructions (Signed)
You need to take carbidopa/levodopa 25/100, ONE tablet at 7am/11am/4pm  You need to EXERCISE!  Local and Online Resources for Power over Parkinson's Group  June 2024   LOCAL [place] PARKINSON'S GROUPS   Power over Parkinson's Group:    Power Over Parkinson's Patient Education Group will be Wednesday, JULY 10th-*PLEASE NOTE:  We will not be having a June meeting.  Our next meeting will be in July.  We apologize for the inconvenience.   Power over [employer] and Care Partner Groups will meet together, with plans for separate break out session for caregivers, depending on topic/speaker Upcoming Power over Parkinson's Meetings/Care Partner Support:  2nd Wednesdays of the month at 2 pm:   No regular June meeting, July 10th  Contact Amy Marriott at [email or web address] or Lynwood Dawley at [email or web address] if interested in participating in this group    LOCAL EVENTS AND NEW OFFERINGS  PLEASE NOTE:  There will be no June Power over [employer] meeting [facility] June 12th) Picnic Party for [employer].  [facility] Neu[facility] Movement Disorders Program Celebration.  June 19th 1-3 pm.  Contact Lynwood Dawley at [email or web address] Parkinson's Social Game Night.  First Thursday of each month, 2:00-4:00 pm.  *Next date is June 6th*.  Rossie Muskrat [employer], [employer].  Contact [email or web address] if interested. Parkinson's CarePartner Group for Men is in the works, if interested email [email or web address] ACT FITNESS Chair Yoga classes "Train and Gain", Fridays 10 am, [employer].  Contact Gina at [phone].  Health visitor Classes offering at [employer]!  TUESDAYS (Chair Yoga)  and Wednesdays (PWR! Moves)  1:00 pm.   Contact Synetta Shadow at  [email or web address]  or [phone]  Antoine Poche! Moves classes.  Thursdays at 11:45, [facility]  Free to  participate through [employer].  Contact Lynwood Dawley at [email or web address] or [phone] to register Drumming for Parkinson's will be held on 2nd and 4th Mondays at 11:00 am.   Located at the [place] of the [employer] ([address] [place]  Contact Albertina Parr at [email or web address] or [phone]  [facility] Parkinson's Tai Chi Class, Mondays at 11 am.  Call [phone] for details   [facility] EDUCATION AND SUPPORT  Parkinson Foundation:  www.parkinson.org  PD Health at Home continues:  Mindfulness Mondays, Wellness Wednesdays, Fitness Fridays  (PWR! Moves as part of Fitness Fridays March 22nd, 1-1:45 pm) Upcoming Education:   Current and Emerging Methods to Aid Parkinson's Diagnosis.  Wednesday, May 29th, 1-2 pm Recognize and Respond to Parkinson's Psychosis.  Wednesday, June 5th, 1-2 pm Parkinsonisms.  Wednesday, June 12th, 1-2 pm Expert Briefing:  Addressing the Challenge of Apathy in Parkinson's.  Wednesday, September 11th, 1-2 pm. Register for virtual education and Photographer (webinars) at [email or web address].gl Please check out their website to sign up for emails and see their full online offerings     [employer]:  www.[email or web address]   Third Thursday Webinars:  On the third Thursday of every month at 12 p.m. ET, join our free live webinars to learn about various aspects of living with Parkinson's disease and our work to speed medical breakthroughs.  Upcoming Webinar:  You Want to [employer] for Parkinson's Research: Now What?  Thursday, June 20th at 12 noon. Check out additional information on their website to see their full online offerings    [facility]:  [email or web address]  Upcoming Webinar:  Living Safely with Parkinson's Inside and Outside of your Home.  Thursday, June 13th , 3  pm Series:  Living with Parkinson's Meetup.   Third Thursdays  each month, 3 pm  Care Partner Monthly Meetup.  With Jillene Bucks [place].  First Tuesday of each month, 2 pm  Check out additional information to Live Well Today on their website    Parkinson and Movement Disorders (PMD) Alliance:  www.pmdalliance.org  [employer]:  Online Education Events  Sign up for emails, which are sent weekly to give you updates on programming and online offerings    Parkinson's Association of the [place]:  www.parkinsonassociation.org  Information on online support groups, education events, and online exercises including Yoga, Parkinson's exercises and more-LOTS of information on links to PD resources and online events  Virtual Support Group through Parkinson's Association of the [place]; next one is scheduled for Wednesday, June 5th   MOVEMENT AND EXERCISE OPPORTUNITIES  Parkinson's Exercise Class offerings at [employer]. *TUESDAYS* (Chair yoga) and Wednesdays (PWR! Moves)  1:00 pm.   *Please note that PWR! Moves [place] class has ended.  Please consider trying PWR! Moves on Wednesdays at 1 pm at [employer]  Contact Synetta Shadow at [email or web address]    Parkinson's Wellness Recovery (PWR! Moves)  www.[email or web address]  Info on the PWR! Virtual Experience:  You will have access to our expertise?through self-assessment, guided plans that start with the PD-specific fundamentals, educational content, tips, Q&A with an expert, and a growing Engineering geologist of PD-specific pre-recorded and live exercise classes of varying types and intensity - both physical and cognitive! If that is not enough, we offer 1:1 wellness consultations (in-person or virtual) to personalize your PWR! Dance movement psychotherapist.   Parkinson [employer] Fridays:   As part of the PD Health @ Home program, this free video series focuses each week on one aspect of fitness designed to support people living with Parkinson's.? These weekly videos highlight the Parkinson Foundation fitness  guidelines for people with Parkinson's disease.  [email or web address].[email or web address]  Dance for PD website is offering free, live-stream classes throughout the week, as well as links to [employer] of classes:  [email or web address]  Virtual dance and Pilates for Parkinson's classes: Click on the Community Tab> Parkinson's Movement Initiative Tab.  To register for classes and for more information, visit www.[email or web address].co.[email or web address] and click the "community" tab.   [employer] Parkinson's Cycling Classes   [facility]:  Thursdays @ Noon-Live classes at [employer] ([employer] at [email or web address]?or [phone])  [facility]: Classes Tuesday, Wednesday and Thursday (contact Dora at [email or web address] ?or [phone])  [facility] [employer]  Varied levels of classes are offered Tuesdays and Thursdays at [employer].   Stretching with Byrd Hesselbach weekly class is also offered for people with Parkinson's  To observe a class or for more information, call [phone] or email Patricia Nettle at [email or web address]   ADDITIONAL SUPPORT AND RESOURCES  [employer]:  Online Caregiver Education Opportunities:  www.well-springsolutions.org/caregiver-education/caregiver-support-group.  You may also contact Loleta Chance at [facility] -spring.org or [phone].     Well-Spring Navigator:  Just1Navigator program, a?free service to help individuals and families through the journey of determining care for older adults.  The "Navigator" is a Child psychotherapist, Sidney Ace, who will speak with a prospective client and/or loved ones to provide an assessment of the situation and a set of recommendations for a personalized care plan -- all free of charge, and whether?[employer] offers the needed service or not. If the need is not a service we provide, we are well-connected with reputable programs  in town that we can refer you to.  www.well-springsolutions.org or  to speak with the Navigator, call [phone].

## 2022-09-27 ENCOUNTER — Other Ambulatory Visit: Payer: Self-pay | Admitting: Neurology

## 2022-09-27 DIAGNOSIS — G20A1 Parkinson's disease without dyskinesia, without mention of fluctuations: Secondary | ICD-10-CM

## 2022-11-05 NOTE — Progress Notes (Signed)
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 RH male with a history of idiopathic Parkinson's disease, and a diagnosis of severe MCI per neuropsychological evaluation on December 2023 (Dr. Beryl Meager today in follow up for memory loss.  MMSE today is 28/30.  Discussed initiation of memantine understanding that this drug is not designed to reverse or improve the symptoms of cognitive disease, but to slow down the worsening of it for a time period.    Follow up in 6 months. Recommend good control of her cardiovascular risk factors Will repeat Neuropsych evaluation in 12 months for clarity of the diagnosis and disease trajectory  Start memantine 5 mg twice daily, side effects discussed.   Continue to control depression as per psychiatry.   Increase mirtazapine for now, at 15 mg nightly to help with sleep and appetite. Continue to follow-up moderate spinal stenosis of the L4-L5    Parkinson's disease, idiopathic Patient was last seen by Dr. Arbutus Leas in May 2024 at which time levodopa needed to be taking as directed: 1 tablet 3 times daily at 7 AM, 11 AM and 4 PM.  He has been compliant with the medication, minimal tremors are noted in today's visit.  Continue physical therapy.  In the past, DaTscan was performed while on sertraline, which may result in false positives ("sertraline can bind to the dopamine transporter ").  Per chart note, generally private insurance would not pay for skin biopsy for alpha synuclein and this was discussed with Dr. Arbutus Leas on prior visit.  Patient has an appointment at our office with Dr. Arbutus Leas on December 2024 for further management  REM behavior disorder Continue bed rails for safety He was reminded that sertraline may potentially worsen RBD, to wear aware if increase on  the medication were to be entertained.  Subjective:    This patient is accompanied in the office  by his wife who supplements the history.  Previous records as well as any outside records available were reviewed prior to todays visit.     Any changes in memory since last visit? "It comes and goes", wife states that it is worse, patient states that is about the same.  The other day he went to his mother house, he was about 4 times a week,  and she called upset, he did not know where he was. He forgets recent information, he may not remember he was even here . He still recognizes his home. He has trouble to tell to her what she is trying to say, which is frustrating to him., resulting in him being less conversant than before. Repeats oneself?  Denies.  Disoriented when walking into a room?  Patient denies    Leaving objects in unusual places?  May misplace things such as the key and the wallet but not in unusual places   Wandering behavior?  Denies.   Any personality changes since last visit?   "I don't know".  He may be more frustrated. Any worsening depression?:  Denies.   Hallucinations or paranoia? "I see  bugs and people that he does not know, as before " Seizures? denies    Any sleep changes? "Too much". Wife says that his dreams are vivid with some REM behavior, denies sleepwalking   Sleep apnea?   Denies.   Any hygiene concerns? Denies.  Independent of bathing and dressing?  Endorsed  Does the patient needs  help with medications? Wife is in charge, places the in a pillbox   Who is in charge of the finances?  Wife  is in charge     Any changes in appetite?  Decreased.   He is taking mirtazapine 7.5 mg but his wife feels that may need to be increased Patient have trouble swallowing? Denies.   Does the patient cook? No Any headaches?   denies   Chronic back pain  denies   Ambulates with difficulty? Does not want to walk even the dog or get on the bike. Has dropped out of the THRIVE group.  Recent falls or head injuries? denies     Unilateral weakness, numbness or tingling? denies    Any tremors?  Denies   Any anosmia?  Denies   Any incontinence of urine?  Denies. Any bowel dysfunction?   Denies      Patient lives with his wife.   Does the patient drive?  He wants to drive, but wife does the driving     PREVIOUS MEDICATIONS:   CURRENT MEDICATIONS:  Outpatient Encounter Medications as of 11/06/2022  Medication Sig   amLODipine (NORVASC) 5 MG tablet Take 5 mg by mouth daily.   atorvastatin (LIPITOR) 20 MG tablet Take 1 tablet by mouth daily.   carbidopa-levodopa (SINEMET IR) 25-100 MG tablet TAKE 1 TABLET BY MOUTH 3 TIMES A DAY (7AM, 11AM, AND 4PM)   ibuprofen (ADVIL,MOTRIN) 600 MG tablet Take 1 tablet (600 mg total) by mouth every 8 (eight) hours as needed for pain. Take with meals   sertraline (ZOLOFT) 50 MG tablet Take 50 mg by mouth daily.   [DISCONTINUED] memantine (NAMENDA) 5 MG tablet Take 1 tablet (5 mg total) by mouth at bedtime.   [DISCONTINUED] mirtazapine (REMERON) 7.5 MG tablet Take 1 tablet (7.5 mg total) by mouth at bedtime.   aspirin 81 MG chewable tablet Chew by mouth daily. (Patient not taking: Reported on 11/06/2022)   memantine (NAMENDA) 5 MG tablet Take 1 tablet (5 mg at night) for 2 weeks, then increase to 1 tablet (5 mg) twice a day   mirtazapine (REMERON) 15 MG tablet Take 1 tablet (15 mg total) by mouth at bedtime.   No facility-administered encounter medications on file as of 11/06/2022.       11/06/2022    3:00 PM 12/25/2021    7:00 PM  MMSE - Mini Mental State Exam  Orientation to time 5 5  Orientation to Place 5 5  Registration 3 3  Attention/ Calculation 5 5  Recall 3 3  Language- name 2 objects 2 2  Language- repeat 1 1  Language- follow 3 step command 3 3  Language- read & follow direction 1 1  Write a sentence 0 1  Copy design 0 0  Total score 28 29      12/25/2020   10:00 AM  Montreal Cognitive Assessment   Visuospatial/ Executive (0/5) 3  Naming (0/3) 3  Attention: Read list of digits (0/2) 2  Attention: Read list  of letters (0/1) 1  Attention: Serial 7 subtraction starting at 100 (0/3) 1  Language: Repeat phrase (0/2) 0  Language : Fluency (0/1) 1  Abstraction (0/2) 1  Delayed Recall (0/5) 3  Orientation (0/6) 6  Total 21    Objective:     PHYSICAL EXAMINATION:    VITALS:   Vitals:   11/06/22 1118 11/06/22 1212  BP: (!) 151/98 (!) 146/92  Pulse: 97   SpO2: 99%   Weight:  186 lb (84.4 kg)   Height: 5\' 9"  (1.753 m)     GEN:  The patient appears stated age and is in NAD. HEENT:  Normocephalic, atraumatic.   Neurological examination:  General: NAD, well-groomed, appears stated age. Orientation: The patient is alert. Oriented to person, place and date Cranial nerves: There is good facial symmetry. flat affect the speech is fluent and clear.  Voice is soft.  No aphasia or dysarthria. Fund of knowledge is appropriate. Recent memory impaired, remote memory normal.   Attention and concentration are normal.  Able to name objects and repeat phrases.  Hearing is intact to conversational tone.   Sensation: Sensation is intact to light touch throughout Motor: Strength is at least antigravity x4. DTR's 2/4 in UE/LE     Movement examination: Tone: There is normal tone in the  LUE/LE, mild increased tone in the RUE Abnormal movements: Minimal tremor on intention.  No myoclonus.  No asterixis.   Coordination:  There is no decremation with RAM's. Normal finger to nose  Gait and Station: The patient has no difficulty arising out of a deep-seated chair without the use of the hands.  Decreased arm swing bilaterally.  The patient's stride length is short.  Gait is cautious and narrow .    Thank you for allowing [place] the opportunity to participate in the care of this nice patient. Please do not hesitate to contact us for any questions or concerns.   Total time spent on today's visit was 30 minutes dedicated to this patient today, preparing to see patient, examining the patient, ordering tests and/or  medications and counseling the patient, documenting clinical information in the EHR or other health record, independently interpreting results and communicating results to the patient/family, discussing treatment and goals, answering patient's questions and coordinating care.  Cc:  Darrow Bussing, MD  Marlowe Kays 11/06/2022 3:25 PM

## 2022-11-06 ENCOUNTER — Ambulatory Visit (INDEPENDENT_AMBULATORY_CARE_PROVIDER_SITE_OTHER): Payer: Self-pay | Admitting: Physician Assistant

## 2022-11-06 ENCOUNTER — Encounter: Payer: Self-pay | Admitting: Physician Assistant

## 2022-11-06 VITALS — BP 146/92 | HR 97 | Ht 69.0 in | Wt 186.0 lb

## 2022-11-06 DIAGNOSIS — G4752 REM sleep behavior disorder: Secondary | ICD-10-CM

## 2022-11-06 DIAGNOSIS — G20A1 Parkinson's disease without dyskinesia, without mention of fluctuations: Secondary | ICD-10-CM

## 2022-11-06 DIAGNOSIS — G3184 Mild cognitive impairment, so stated: Secondary | ICD-10-CM

## 2022-11-06 MED ORDER — MIRTAZAPINE 15 MG PO TABS: 15.0000 mg | ORAL_TABLET | Freq: Every day | ORAL | 11 refills | Status: DC

## 2022-11-06 MED ORDER — MEMANTINE HCL 5 MG PO TABS: 5.0000 mg | ORAL_TABLET | Freq: Every evening | ORAL | 11 refills | Status: DC

## 2022-11-06 MED ORDER — MEMANTINE HCL 5 MG PO TABS: ORAL_TABLET | ORAL | 11 refills | Status: DC

## 2022-11-06 NOTE — Patient Instructions (Addendum)
It was a pleasure to see you today at our office.   Recommendations:  Keep appt with Psychiatry  Follow up  March 3 at 11:30   Appt with Dr Tat in the near future continue the meds s prescribed  Increase mirtazapine to 15 mg nightly for now Start memantine 5 mg for 2 weeks at night then increase to 5 mg two times a day   RECOMMENDATIONS FOR ALL PATIENTS WITH MEMORY PROBLEMS: 1. Continue to exercise (Recommend 30 minutes of walking everyday, or 3 hours every week) 2. Increase social interactions - continue going to [place] and enjoy social gatherings with friends and family 3. Eat healthy, avoid fried foods and eat more fruits and vegetables 4. Maintain adequate blood pressure, blood sugar, and blood cholesterol level. Reducing the risk of stroke and cardiovascular disease also helps promoting better memory. 5. Avoid stressful situations. Live a simple life and avoid aggravations. Organize your time and prepare for the next day in anticipation. 6. Sleep well, avoid any interruptions of sleep and avoid any distractions in the bedroom that may interfere with adequate sleep quality 7. Avoid sugar, avoid sweets as there is a strong link between excessive sugar intake, diabetes, and cognitive impairment We discussed the Mediterranean diet, which has been shown to help patients reduce the risk of progressive memory disorders and reduces cardiovascular risk. This includes eating fish, eat fruits and green leafy vegetables, nuts like almonds and hazelnuts, walnuts, and also use olive oil. Avoid fast foods and fried foods as much as possible. Avoid sweets and sugar as sugar use has been linked to worsening of memory function.  There is always a concern of gradual progression of memory problems. If this is the case, then we may need to adjust level of care according to patient needs. Support, both to the patient and caregiver, should then be put into place.       FALL PRECAUTIONS: Be cautious when  walking. Scan the area for obstacles that may increase the risk of trips and falls. When getting up in the mornings, sit up at the edge of the bed for a few minutes before getting out of bed. Consider elevating the bed at the head end to avoid drop of blood pressure when getting up. Walk always in a well-lit room (use night lights in the walls). Avoid area rugs or power cords from appliances in the middle of the walkways. Use a walker or a cane if necessary and consider physical therapy for balance exercise. Get your eyesight checked regularly.  FINANCIAL OVERSIGHT: Supervision, especially oversight when making financial decisions or transactions is also recommended.  HOME SAFETY: Consider the safety of the kitchen when operating appliances like stoves, microwave oven, and blender. Consider having supervision and share cooking responsibilities until no longer able to participate in those. Accidents with firearms and other hazards in the house should be identified and addressed as well.   ABILITY TO BE LEFT ALONE: If patient is unable to contact 911 operator, consider using LifeLine, or when the need is there, arrange for someone to stay with patients. Smoking is a fire hazard, consider supervision or cessation. Risk of wandering should be assessed by caregiver and if detected at any point, supervision and safe proof recommendations should be instituted.  MEDICATION SUPERVISION: Inability to self-administer medication needs to be constantly addressed. Implement a mechanism to ensure safe administration of the medications.   DRIVING: Regarding driving, in patients with progressive memory problems, driving will be impaired. We advise  to have someone else do the driving if trouble finding directions or if minor accidents are reported. Independent driving assessment is available to determine safety of driving.   If you are interested in the driving assessment, you can contact the following:  The  [employer] in [place] [phone]  [facility] [phone]  [facility] [place] [phone] or [phone]    Mediterranean Diet A Mediterranean diet refers to food and lifestyle choices that are based on the traditions of countries located on the [employer]. This way of eating has been shown to help prevent certain conditions and improve outcomes for people who have chronic diseases, like kidney disease and heart disease. What are tips for following this plan? Lifestyle  Cook and eat meals together with your family, when possible. Drink enough fluid to keep your urine clear or pale yellow. Be physically active every day. This includes: Aerobic exercise like running or swimming. Leisure activities like gardening, walking, or housework. Get 7-8 hours of sleep each night. If recommended by your health care provider, drink red wine in moderation. This means 1 glass a day for nonpregnant women and 2 glasses a day for men. A glass of wine equals 5 oz (150 mL). Reading food labels  Check the serving size of packaged foods. For foods such as rice and pasta, the serving size refers to the amount of cooked product, not dry. Check the total fat in packaged foods. Avoid foods that have saturated fat or trans fats. Check the ingredients list for added sugars, such as corn syrup. Shopping  At the grocery store, buy most of your food from the areas near the walls of the store. This includes: Fresh fruits and vegetables (produce). Grains, beans, nuts, and seeds. Some of these may be available in unpackaged forms or large amounts (in bulk). Fresh seafood. Poultry and eggs. Low-fat dairy products. Buy whole ingredients instead of prepackaged foods. Buy fresh fruits and vegetables in-season from local farmers markets. Buy frozen fruits and vegetables in resealable bags. If you do not have access to quality fresh seafood, buy  precooked frozen shrimp or canned fish, such as tuna, salmon, or sardines. Buy small amounts of raw or cooked vegetables, salads, or olives from the deli or salad bar at your store. Stock your pantry so you always have certain foods on hand, such as olive oil, canned tuna, canned tomatoes, rice, pasta, and beans. Cooking  Cook foods with extra-virgin olive oil instead of using butter or other vegetable oils. Have meat as a side dish, and have vegetables or grains as your main dish. This means having meat in small portions or adding small amounts of meat to foods like pasta or stew. Use beans or vegetables instead of meat in common dishes like chili or lasagna. Experiment with different cooking methods. Try roasting or broiling vegetables instead of steaming or sauteing them. Add frozen vegetables to soups, stews, pasta, or rice. Add nuts or seeds for added healthy fat at each meal. You can add these to yogurt, salads, or vegetable dishes. Marinate fish or vegetables using olive oil, lemon juice, garlic, and fresh herbs. Meal planning  Plan to eat 1 vegetarian meal one day each week. Try to work up to 2 vegetarian meals, if possible. Eat seafood 2 or more times a week. Have healthy snacks readily available, such as: Vegetable sticks with hummus. Greek yogurt. Fruit and nut trail mix. Eat balanced meals throughout the week. This includes: Fruit: 2-3  servings a day Vegetables: 4-5 servings a day Low-fat dairy: 2 servings a day Fish, poultry, or lean meat: 1 serving a day Beans and legumes: 2 or more servings a week Nuts and seeds: 1-2 servings a day Whole grains: 6-8 servings a day Extra-virgin olive oil: 3-4 servings a day Limit red meat and sweets to only a few servings a month What are my food choices? Mediterranean diet Recommended Grains: Whole-grain pasta. Brown rice. Bulgar wheat. Polenta. Couscous. Whole-wheat bread. Orpah Cobb. Vegetables: Artichokes. Beets. Broccoli.  Cabbage. Carrots. Eggplant. Green beans. Chard. Kale. Spinach. Onions. Leeks. Peas. Squash. Tomatoes. Peppers. Radishes. Fruits: Apples. Apricots. Avocado. Berries. Bananas. Cherries. Dates. Figs. Grapes. Lemons. Melon. Oranges. Peaches. Plums. Pomegranate. Meats and other protein foods: Beans. Almonds. Sunflower seeds. Pine nuts. Peanuts. Cod. Salmon. Scallops. Shrimp. Tuna. Tilapia. Clams. Oysters. Eggs. Dairy: Low-fat milk. Cheese. Greek yogurt. Beverages: Water. Red wine. Herbal tea. Fats and oils: Extra virgin olive oil. Avocado oil. Grape seed oil. Sweets and desserts: [place] yogurt with honey. Baked apples. Poached pears. Trail mix. Seasoning and other foods: Basil. Cilantro. Coriander. Cumin. Mint. Parsley. Sage. Rosemary. Tarragon. Garlic. Oregano. Thyme. Pepper. Balsalmic vinegar. Tahini. Hummus. Tomato sauce. Olives. Mushrooms. Limit these Grains: Prepackaged pasta or rice dishes. Prepackaged cereal with added sugar. Vegetables: Deep fried potatoes (french fries). Fruits: Fruit canned in syrup. Meats and other protein foods: Beef. Pork. Lamb. Poultry with skin. Hot dogs. Tomasa Blase. Dairy: Ice cream. Sour cream. Whole milk. Beverages: Juice. Sugar-sweetened soft drinks. Beer. Liquor and spirits. Fats and oils: Butter. Canola oil. Vegetable oil. Beef fat (tallow). Lard. Sweets and desserts: Cookies. Cakes. Pies. Candy. Seasoning and other foods: Mayonnaise. Premade sauces and marinades. The items listed may not be a complete list. Talk with your dietitian about what dietary choices are right for you. Summary The Mediterranean diet includes both food and lifestyle choices. Eat a variety of fresh fruits and vegetables, beans, nuts, seeds, and whole grains. Limit the amount of red meat and sweets that you eat. Talk with your health care provider about whether it is safe for you to drink red wine in moderation. This means 1 glass a day for nonpregnant women and 2 glasses a day for men. A glass  of wine equals 5 oz (150 mL). This information is not intended to replace advice given to you by your health care provider. Make sure you discuss any questions you have with your health care provider. Document Released: 10/17/2015 Document Revised: 11/19/2015 Document Reviewed: 10/17/2015 Elsevier Interactive Patient Education  2017 [employer].

## 2023-01-03 ENCOUNTER — Other Ambulatory Visit: Payer: Self-pay | Admitting: Neurology

## 2023-01-03 DIAGNOSIS — G20A1 Parkinson's disease without dyskinesia, without mention of fluctuations: Secondary | ICD-10-CM

## 2023-01-21 ENCOUNTER — Ambulatory Visit: Payer: Self-pay | Attending: Physical Therapy | Admitting: Physical Therapy

## 2023-02-15 NOTE — Progress Notes (Unsigned)
Assessment/Plan:   Idiopathic Parkinsons Disease              -Suspect that this is idiopathic Parkinson's disease.  Memory loss at onset no longer precludes the diagnosis.  Dr. Milbert Coulter wonders if LBD is the diagnosis, while it cannot be r/o, I don't see red flags             -Long discussion about his levodopa.  He is only taking 2 tablets in the morning and then on the rest of the day.  I told him that this is not the way that works and he needs to take 1 tablet 3 times per day at 7 AM/11 AM/4 PM.  He may need more than this in the future, but we need to start with getting him to take it properly.             -Long discussion about the importance of safe, cardiovascular exercise and the fact that exercise is likely the only thing that slows progression of disease.             -Talked about having him get more involved with the Parkinson's community.  Emailed our Child psychotherapist and told him to contact him about the [facility] group.             -Patient's DaTscan was done on sertraline.  Generally, sertraline is held 3 days prior to DaTscan being performed.  Sertraline can bind to the dopamine transporter and cause false positives.             -generally private insurance will not pay for skin bx for alpha synuclein.  Did discuss this today.     2.  REM behavior disorder             -they are using bedrails so he is safe             -may be made worse by sertraline.  This was just increased by pcp, which may not have been a bad idea given mood, but did discuss with him that it could potentially worsen RBD.   3. Depression             -Following with Dr. Donell Beers.  -Recommending that mood/behavior care be taking care of all by psychiatry.  -He was started on mirtazapine by Marlowe Kays.   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 Memory change             -neurocog  testing with dr. Milbert Coulter in 02/2022 with [facility], severe end of spectrum.  I suspect may have progressed since testing  -Marlowe Kays and has him on memantine, 5 mg twice per day  -pt and I discussed need to apply for disability.  Pt cannot continue to work.  He is slow cognitively and physically.   Subjective:   Tyler Dickerson was seen today in follow up for Parkinsons disease.  My previous records were reviewed prior to todays visit as well as outside records available to me.  Patient with wife who supplements history.  Discussed last visit that patient needed to take his levodopa more than once per day.  He was previously taking it just with 2 tablets in the morning.  He reports today that ***.  He saw Marlowe Kays in August and she  started him on memantine and increased his mirtazapine.  He is tolerating that well, without side effects.  He has been to physical therapy since our last visit.  Notes reviewed.  Current prescribed movement disorder medications: Carbidopa/levodopa 25/100, 1 tablet 3 times per day. Mirtazapine, 15 mg at bed  Memantine, 5 mg twice per day  PREVIOUS MEDICATIONS: Sinemet  ALLERGIES:  No Known Allergies  CURRENT MEDICATIONS:  No outpatient medications have been marked as taking for the 02/16/23 encounter (Appointment) with Damoni Causby, Octaviano Batty, DO.     Objective:   PHYSICAL EXAMINATION:    VITALS:   There were no vitals filed for this visit.   GEN:  The patient appears stated age and is in NAD. HEENT:  Normocephalic, atraumatic.  The mucous membranes are moist. The superficial temporal arteries are without ropiness or tenderness. CV:  RRR Lungs:  CTAB Neck/HEME:  There are no carotid bruits bilaterally.  Neurological examination:  Orientation: The patient is alert and oriented x3.  Wife assists with finer aspects and they do not always agree Cranial nerves: There is good facial symmetry with facial hypomimia. The speech is fluent and hypophonic. Soft palate  rises symmetrically and there is no tongue deviation. Hearing is intact to conversational tone. Sensation: Sensation is intact to light touch throughout Motor: Strength is at least antigravity x4.  He was seen at 3:45 at last took medication at 8:45am (took 2 levodopa then)  Movement examination: Tone: There is mild increased tone in the RUE (improved) and nl on the L Abnormal movements: none Coordination:  There is decremation with RAM's, with any form of RAMS, including alternating supination and pronation of the forearm, hand opening and closing, finger taps, heel taps and toe taps, R>L.  There is some apraxia when asked to perform commands of coordination. Gait and Station: The patient has no difficulty arising out of a deep-seated chair without the use of the hands. The patient's stride length is slightly decreased, no shuffling.  He has slight decreased arm swing bilaterally.      Total time spent on today's visit was *** minutes, including both face-to-face time and nonface-to-face time.  Time included that spent on review of records (prior notes available to me/labs/imaging if pertinent), discussing treatment and goals, answering patient's questions and coordinating care.  Cc:  Darrow Bussing, MD

## 2023-02-16 ENCOUNTER — Encounter: Payer: Self-pay | Admitting: Neurology

## 2023-02-16 ENCOUNTER — Ambulatory Visit: Payer: Self-pay | Admitting: Neurology

## 2023-02-16 DIAGNOSIS — Z029 Encounter for administrative examinations, unspecified: Secondary | ICD-10-CM

## 2023-05-09 NOTE — Progress Notes (Signed)
 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 RH male with a history of idiopathic Parkinson's disease, and a diagnosis of severe MCI per neuropsychological evaluation on December 2023 (Dr. Milbert Coulter) seen today in follow up for memory loss. Patient is currently on memantine 10 mg bid, tolerating well. Cognitive decline is noted. MMSE today is 25/30. He needs more assistance than prior. Discussed  adding donepezil with goals too increase to 10 mg daily, wife would like to try, in an effort to slow down cognitive decline.     Follow up in 6  months. Recommend good control of her cardiovascular risk factors Continue memantine10  mg bid, side effects discussed  Start Donepezil 10 mg  as directed,1/2 tab daily for 2 weeks, increase to 10 mg daily. side effects discussed  Continue to control depression as per psychiatry (Dr. Donell Beers) Repeat neuropsych evaluation for diagnostic clarity and disease trajectory Continue mirtazapine 15 mg nightly to help with sleep and appetite Continue to follow-up for moderate spinal stenosis of L4-L5  Parkinson's disease, idiopathic Patient was last seen by Dr. Arbutus Leas in May 2024, was to be followed up but patient's son died. Recently he has resume providers appointments.  He takes Carbi-Levo tid 1 tablet 3 times daily at 8 AM, 12 AM and 5 PM.  He has been compliant with the medication, minimal tremors are noted in today's visit.  Continue home physical therapy.  In the past, DaTscan was performed while on sertraline, which may result in false positives ("sertraline can bind to the dopamine transporter ").  Per chart note, generally private insurance would not pay for skin biopsy for alpha synuclein and this was discussed with Dr. Arbutus Leas on prior visit.  Patient has an appointment at our office with Dr. Arbutus Leas on June 04, 2023 for further management   REM behavior  disorder Continue bed rails for safety He was reminded that sertraline may potentially worsen RBD, to wear aware if increase on  the medication were to be entertained.   Subjective:    This patient is accompanied in the office by his wife who supplements the history.  Previous records as well as any outside records available were reviewed prior to todays visit. Patient was last seen on 11/06/22 with MMSE 28/30     Any changes in memory since last visit? "Worse, drastic, especially over the last 3-4 weeks"-by wife's report. She says that they were 2 episodes in which he does not recognize her. He has difficulty telling his wife what he wants to say, which becomes frustrating to him, resulting in becoming less conversant.  repeats oneself?  "Sometimes". Disoriented when walking into a room? He refers home to "his mother's house", no his current address. .  Leaving objects?  May misplace things but not in unusual places   Wandering behavior?  denies   Any personality changes since last visit? He quickly says"I don't know". He may become irritable at times.  Any worsening depression?:  Has lost his son a few months ago,which caused more depression  Hallucinations or paranoia? He sees bugs and people that he does not know, as before. Recently " saw animals coming out of the bed, raining in the ceiling" Seizures? denies    Any sleep changes?  Endorsed. He reports vivid dreams, with REM behavior, more frequent than before,  denies sleepwalking   Sleep apnea?   Denies.  Any hygiene concerns? He needs to be reminded.  Independent of bathing and dressing?  Endorsed  Does the patient needs help with medications? Wife is in charge, places them in a pillbox   Who is in charge of the finances?  Wife is in charge     Any changes in appetite?  denies     Patient have trouble swallowing? Denies.   Does the patient cook? No Any headaches?   denies   Chronic back pain  endorsed, history of L4-L5 moderate  stenosis, but denies any pain at this time. Ambulates with difficulty? Denies. He is not very active.   Recent falls or head injuries? denies     Unilateral weakness, numbness or tingling? denies   Any tremors?  Endorsed no changes.  Takes Carbi-Levo  1 in 8, 12 and 5  Any anosmia?  Denies   Any incontinence of urine? Denies  Any bowel dysfunction?   Denies      Patient lives with his wife   Does the patient drive? No longer drives     PREVIOUS MEDICATIONS:   CURRENT MEDICATIONS:  Outpatient Encounter Medications as of 05/10/2023  Medication Sig   amLODipine (NORVASC) 5 MG tablet Take 5 mg by mouth daily.   aspirin 81 MG chewable tablet Chew by mouth daily.   atorvastatin (LIPITOR) 20 MG tablet Take 1 tablet by mouth daily.   carbidopa-levodopa (SINEMET IR) 25-100 MG tablet TAKE 1 TABLET BY MOUTH 3 TIMES A DAY (7AM, 11AM, AND 4PM)   ibuprofen (ADVIL,MOTRIN) 600 MG tablet Take 1 tablet (600 mg total) by mouth every 8 (eight) hours as needed for pain. Take with meals   memantine (NAMENDA) 5 MG tablet Take 1 tablet (5 mg at night) for 2 weeks, then increase to 1 tablet (5 mg) twice a day   mirtazapine (REMERON) 15 MG tablet Take 1 tablet (15 mg total) by mouth at bedtime.   sertraline (ZOLOFT) 50 MG tablet Take 100 mg by mouth daily.   No facility-administered encounter medications on file as of 05/10/2023.       11/06/2022    3:00 PM 12/25/2021    7:00 PM  MMSE - Mini Mental State Exam  Orientation to time 5 5  Orientation to Place 5 5  Registration 3 3  Attention/ Calculation 5 5  Recall 3 3  Language- name 2 objects 2 2  Language- repeat 1 1  Language- follow 3 step command 3 3  Language- read & follow direction 1 1  Write a sentence 0 1  Copy design 0 0  Total score 28 29      12/25/2020   10:00 AM  Montreal Cognitive Assessment   Visuospatial/ Executive (0/5) 3  Naming (0/3) 3  Attention: Read list of digits (0/2) 2  Attention: Read list of letters (0/1) 1   Attention: Serial 7 subtraction starting at 100 (0/3) 1  Language: Repeat phrase (0/2) 0  Language : Fluency (0/1) 1  Abstraction (0/2) 1  Delayed Recall (0/5) 3  Orientation (0/6) 6  Total 21    Objective:     PHYSICAL EXAMINATION:    VITALS:   Vitals:   05/10/23 1117  BP: 128/74  Pulse: 88  Resp: 20  SpO2: 97%  Weight: 192 lb (87.1 kg)  Height: 5\' 9"  (1.753 m)    GEN:  The patient appears stated age and is in NAD. HEENT:  Normocephalic, atraumatic.   Neurological examination:  General: NAD, well-groomed, appears stated age.  Orientation: The patient is alert. Oriented to person, not ot place and date Cranial nerves: There is good facial symmetry. Hypomimia. The speech is fluent and clear. No aphasia or dysarthria. Fund of knowledge is appropriate. Recent and remote memory are impaired. Attention and concentration are reduced.  Able to name objects and repeat phrases.  Hearing is intact to conversational tone.   Sensation: Sensation is intact to light touch throughout Motor: Strength is at least antigravity x4. DTR's 2/4 in UE/LE     Movement examination: Tone: There is increased tone in the RUE, otherwise normal tone  Abnormal movements: minimal intention tremor   No myoclonus.  No asterixis.   Coordination:  There is no decremation with RAM's. Normal finger to nose  Gait and Station: The patient has no difficulty arising out of a deep-seated chair without the use of the hands.  Decreased arm swing. Patient's stride length is short, questionable R LE ataxia,  Gait is cautious and narrow.    Thank you for allowing [place] the opportunity to participate in the care of this nice patient. Please do not hesitate to contact us for any questions or concerns.   Total time spent on today's visit was 37 minutes dedicated to this patient today, preparing to see patient, examining the patient, ordering tests and/or medications and counseling the patient, documenting clinical  information in the EHR or other health record, independently interpreting results and communicating results to the patient/family, discussing treatment and goals, answering patient's questions and coordinating care.  Cc:  Darrow Bussing, MD  Marlowe Kays 05/10/2023 11:52 AM

## 2023-05-10 ENCOUNTER — Ambulatory Visit (INDEPENDENT_AMBULATORY_CARE_PROVIDER_SITE_OTHER): Payer: Self-pay | Admitting: Physician Assistant

## 2023-05-10 ENCOUNTER — Encounter: Payer: Self-pay | Admitting: Physician Assistant

## 2023-05-10 VITALS — BP 128/74 | HR 88 | Resp 20 | Ht 69.0 in | Wt 192.0 lb

## 2023-05-10 DIAGNOSIS — G3184 Mild cognitive impairment, so stated: Secondary | ICD-10-CM

## 2023-05-10 DIAGNOSIS — G20A1 Parkinson's disease without dyskinesia, without mention of fluctuations: Secondary | ICD-10-CM

## 2023-05-10 MED ORDER — DONEPEZIL HCL 10 MG PO TABS: ORAL_TABLET | ORAL | 11 refills | Status: DC

## 2023-05-10 NOTE — Patient Instructions (Addendum)
 It was a pleasure to see you today at our office.   Recommendations:  Keep appt with Psychiatry  Follow up in Sept 8 at 11:30   Appt with Dr Tat in  March 2025 Continue mirtazapine as directed Continue memantine 10 mg two times a day  Start Donepezil 10 mg :Take half tablet (5 mg) daily for 2 weeks, then increase to the full tablet at 10 mg daily.    RECOMMENDATIONS FOR ALL PATIENTS WITH MEMORY PROBLEMS: 1. Continue to exercise (Recommend 30 minutes of walking everyday, or 3 hours every week) 2. Increase social interactions - continue going to [place] and enjoy social gatherings with friends and family 3. Eat healthy, avoid fried foods and eat more fruits and vegetables 4. Maintain adequate blood pressure, blood sugar, and blood cholesterol level. Reducing the risk of stroke and cardiovascular disease also helps promoting better memory. 5. Avoid stressful situations. Live a simple life and avoid aggravations. Organize your time and prepare for the next day in anticipation. 6. Sleep well, avoid any interruptions of sleep and avoid any distractions in the bedroom that may interfere with adequate sleep quality 7. Avoid sugar, avoid sweets as there is a strong link between excessive sugar intake, diabetes, and cognitive impairment We discussed the Mediterranean diet, which has been shown to help patients reduce the risk of progressive memory disorders and reduces cardiovascular risk. This includes eating fish, eat fruits and green leafy vegetables, nuts like almonds and hazelnuts, walnuts, and also use olive oil. Avoid fast foods and fried foods as much as possible. Avoid sweets and sugar as sugar use has been linked to worsening of memory function.  There is always a concern of gradual progression of memory problems. If this is the case, then we may need to adjust level of care according to patient needs. Support, both to the patient and caregiver, should then be put into place.       FALL  PRECAUTIONS: Be cautious when walking. Scan the area for obstacles that may increase the risk of trips and falls. When getting up in the mornings, sit up at the edge of the bed for a few minutes before getting out of bed. Consider elevating the bed at the head end to avoid drop of blood pressure when getting up. Walk always in a well-lit room (use night lights in the walls). Avoid area rugs or power cords from appliances in the middle of the walkways. Use a walker or a cane if necessary and consider physical therapy for balance exercise. Get your eyesight checked regularly.  FINANCIAL OVERSIGHT: Supervision, especially oversight when making financial decisions or transactions is also recommended.  HOME SAFETY: Consider the safety of the kitchen when operating appliances like stoves, microwave oven, and blender. Consider having supervision and share cooking responsibilities until no longer able to participate in those. Accidents with firearms and other hazards in the house should be identified and addressed as well.   ABILITY TO BE LEFT ALONE: If patient is unable to contact 911 operator, consider using LifeLine, or when the need is there, arrange for someone to stay with patients. Smoking is a fire hazard, consider supervision or cessation. Risk of wandering should be assessed by caregiver and if detected at any point, supervision and safe proof recommendations should be instituted.  MEDICATION SUPERVISION: Inability to self-administer medication needs to be constantly addressed. Implement a mechanism to ensure safe administration of the medications.   DRIVING: Regarding driving, in patients with progressive memory problems, driving  will be impaired. We advise to have someone else do the driving if trouble finding directions or if minor accidents are reported. Independent driving assessment is available to determine safety of driving.   If you are interested in the driving assessment, you can contact  the following:  [employer] in [place] [phone]  [facility] [phone]  [facility] [phone] or [phone]    Mediterranean Diet A Mediterranean diet refers to food and lifestyle choices that are based on the traditions of countries located on the [employer]. This way of eating has been shown to help prevent certain conditions and improve outcomes for people who have chronic diseases, like kidney disease and heart disease. What are tips for following this plan? Lifestyle  Cook and eat meals together with your family, when possible. Drink enough fluid to keep your urine clear or pale yellow. Be physically active every day. This includes: Aerobic exercise like running or swimming. Leisure activities like gardening, walking, or housework. Get 7-8 hours of sleep each night. If recommended by your health care provider, drink red wine in moderation. This means 1 glass a day for nonpregnant women and 2 glasses a day for men. A glass of wine equals 5 oz (150 mL). Reading food labels  Check the serving size of packaged foods. For foods such as rice and pasta, the serving size refers to the amount of cooked product, not dry. Check the total fat in packaged foods. Avoid foods that have saturated fat or trans fats. Check the ingredients list for added sugars, such as corn syrup. Shopping  At the grocery store, buy most of your food from the areas near the walls of the store. This includes: Fresh fruits and vegetables (produce). Grains, beans, nuts, and seeds. Some of these may be available in unpackaged forms or large amounts (in bulk). Fresh seafood. Poultry and eggs. Low-fat dairy products. Buy whole ingredients instead of prepackaged foods. Buy fresh fruits and vegetables in-season from local farmers markets. Buy frozen fruits and vegetables in resealable bags. If you do not have access to  quality fresh seafood, buy precooked frozen shrimp or canned fish, such as tuna, salmon, or sardines. Buy small amounts of raw or cooked vegetables, salads, or olives from the deli or salad bar at your store. Stock your pantry so you always have certain foods on hand, such as olive oil, canned tuna, canned tomatoes, rice, pasta, and beans. Cooking  Cook foods with extra-virgin olive oil instead of using butter or other vegetable oils. Have meat as a side dish, and have vegetables or grains as your main dish. This means having meat in small portions or adding small amounts of meat to foods like pasta or stew. Use beans or vegetables instead of meat in common dishes like chili or lasagna. Experiment with different cooking methods. Try roasting or broiling vegetables instead of steaming or sauteing them. Add frozen vegetables to soups, stews, pasta, or rice. Add nuts or seeds for added healthy fat at each meal. You can add these to yogurt, salads, or vegetable dishes. Marinate fish or vegetables using olive oil, lemon juice, garlic, and fresh herbs. Meal planning  Plan to eat 1 vegetarian meal one day each week. Try to work up to 2 vegetarian meals, if possible. Eat seafood 2 or more times a week. Have healthy snacks readily available, such as: Vegetable sticks with hummus. Greek yogurt. Fruit and nut trail mix. Eat balanced meals throughout the  week. This includes: Fruit: 2-3 servings a day Vegetables: 4-5 servings a day Low-fat dairy: 2 servings a day Fish, poultry, or lean meat: 1 serving a day Beans and legumes: 2 or more servings a week Nuts and seeds: 1-2 servings a day Whole grains: 6-8 servings a day Extra-virgin olive oil: 3-4 servings a day Limit red meat and sweets to only a few servings a month What are my food choices? Mediterranean diet Recommended Grains: Whole-grain pasta. Brown rice. Bulgar wheat. Polenta. Couscous. Whole-wheat bread. Orpah Cobb. Vegetables:  Artichokes. Beets. Broccoli. Cabbage. Carrots. Eggplant. Green beans. Chard. Kale. Spinach. Onions. Leeks. Peas. Squash. Tomatoes. Peppers. Radishes. Fruits: Apples. Apricots. Avocado. Berries. Bananas. Cherries. Dates. Figs. Grapes. Lemons. Melon. Oranges. Peaches. Plums. Pomegranate. Meats and other protein foods: Beans. Almonds. Sunflower seeds. Pine nuts. Peanuts. Cod. Salmon. Scallops. Shrimp. Tuna. Tilapia. Clams. Oysters. Eggs. Dairy: Low-fat milk. Cheese. Greek yogurt. Beverages: Water. Red wine. Herbal tea. Fats and oils: Extra virgin olive oil. Avocado oil. Grape seed oil. Sweets and desserts: [place] yogurt with honey. Baked apples. Poached pears. Trail mix. Seasoning and other foods: Basil. Cilantro. Coriander. Cumin. Mint. Parsley. Sage. Rosemary. Tarragon. Garlic. Oregano. Thyme. Pepper. Balsalmic vinegar. Tahini. Hummus. Tomato sauce. Olives. Mushrooms. Limit these Grains: Prepackaged pasta or rice dishes. Prepackaged cereal with added sugar. Vegetables: Deep fried potatoes (french fries). Fruits: Fruit canned in syrup. Meats and other protein foods: Beef. Pork. Lamb. Poultry with skin. Hot dogs. Tomasa Blase. Dairy: Ice cream. Sour cream. Whole milk. Beverages: Juice. Sugar-sweetened soft drinks. Beer. Liquor and spirits. Fats and oils: Butter. Canola oil. Vegetable oil. Beef fat (tallow). Lard. Sweets and desserts: Cookies. Cakes. Pies. Candy. Seasoning and other foods: Mayonnaise. Premade sauces and marinades. The items listed may not be a complete list. Talk with your dietitian about what dietary choices are right for you. Summary The Mediterranean diet includes both food and lifestyle choices. Eat a variety of fresh fruits and vegetables, beans, nuts, seeds, and whole grains. Limit the amount of red meat and sweets that you eat. Talk with your health care provider about whether it is safe for you to drink red wine in moderation. This means 1 glass a day for nonpregnant women and 2  glasses a day for men. A glass of wine equals 5 oz (150 mL). This information is not intended to replace advice given to you by your health care provider. Make sure you discuss any questions you have with your health care provider. Document Released: 10/17/2015 Document Revised: 11/19/2015 Document Reviewed: 10/17/2015 Elsevier Interactive Patient Education  2017 [employer].

## 2023-05-26 ENCOUNTER — Other Ambulatory Visit: Payer: Self-pay | Admitting: Neurology

## 2023-05-26 DIAGNOSIS — G20A1 Parkinson's disease without dyskinesia, without mention of fluctuations: Secondary | ICD-10-CM

## 2023-06-02 NOTE — Progress Notes (Unsigned)
 Assessment/Plan:  Idiopathic Parkinsons Disease              -Suspect that this is idiopathic Parkinson's disease.  Memory loss at onset no longer precludes the diagnosis.  Dr. Milbert Coulter wonders if LBD is the diagnosis, while it cannot be r/o, I don't see red flags             -increase carbidopa/levodopa 25/100, 2/2/1 and told them likely will have to increase further and likely will need to add q hs levodopa             -Long discussion about the importance of safe, cardiovascular exercise and the fact that exercise is likely the only thing that slows progression of disease.      2.  REM behavior disorder             -they are using bedrails so he is safe             -may be made worse by sertraline.  This was just increased by pcp, which may not have been a bad idea given mood, but did discuss with him that it could potentially worsen RBD.   3. Depression             -Following with Dr. Donell Beers.  -Unfortunately, the patient's son passed away within the last year, which has obviously contributed to worsening of mood.   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 Memory change             -neurocog testing with dr. Milbert Coulter in 02/2022 with [facility], severe end of spectrum.  I suspect may have progressed since testing.  Will repeat neurocog testing  -Marlowe Kays and has him on memantine, 10 mg twice per day and donepezil, 10 mg daily  -pt now on disability  -pt no longer driving and wife still working - limits ability to get to Parkinsons Disease exercises  Subjective:   Tyler Dickerson was seen today in follow up for Parkinsons disease.  My previous records were reviewed prior to todays visit as well as outside records available to me.  Patient with wife who supplements history.  I have not seen the patient for almost a year.  He missed his December appointment with me  (appt reminder went to pt and not wife and she didn't know about it).  Unfortunately, the patient's son passed away since our last visit.  Discussed last visit that patient needed to take his levodopa more than once per day.  He was previously taking it just with 2 tablets in the morning.  he is taking his levodopa 3 times per day.  He reports compliance with medication.  He saw Marlowe Kays in August and she started him on memantine and increased his mirtazapine.  He is tolerating that well, without side effects.  He saw her again just a few weeks ago and she started him on donepezil, 10 mg daily.  He has been to physical therapy since our last visit.  Notes reviewed.  He is now on disability but has no insurance.  He is not exercising.    Current prescribed movement disorder medications: Carbidopa/levodopa 25/100, 1 tablet 3 times per day. Mirtazapine, 15 mg at bed  Memantine, 10 mg twice per day Donepezil, 10 mg daily  PREVIOUS  MEDICATIONS: Sinemet Current prescribed movement disorder medications: Carbidopa/levodopa 25/100, 1 tablet 3 times per day. Mirtazapine, 7.5 mg at bedtime Marlowe Kays)  PREVIOUS MEDICATIONS: Sinemet  ALLERGIES:  No Known Allergies  CURRENT MEDICATIONS:  Current Meds  Medication Sig   amLODipine (NORVASC) 5 MG tablet Take 5 mg by mouth daily.   aspirin 81 MG chewable tablet Chew by mouth daily.   atorvastatin (LIPITOR) 20 MG tablet Take 1 tablet by mouth daily.   carbidopa-levodopa (SINEMET IR) 25-100 MG tablet TAKE 1 TABLET BY MOUTH 3 TIMES A DAY (7AM, 11AM, AND 4PM)   donepezil (ARICEPT) 10 MG tablet Take half tablet (5 mg) daily for 2 weeks, then increase to the full tablet at 10 mg daily   ibuprofen (ADVIL,MOTRIN) 600 MG tablet Take 1 tablet (600 mg total) by mouth every 8 (eight) hours as needed for pain. Take with meals   memantine (NAMENDA) 5 MG tablet Take 1 tablet (5 mg at night) for 2 weeks, then increase to 1 tablet (5 mg) twice a day   mirtazapine  (REMERON) 15 MG tablet Take 1 tablet (15 mg total) by mouth at bedtime.   sertraline (ZOLOFT) 50 MG tablet Take 100 mg by mouth daily.     Objective:   PHYSICAL EXAMINATION:    VITALS:   Vitals:   06/04/23 0804  BP: 118/84  Pulse: 86  SpO2: 99%  Weight: 192 lb (87.1 kg)  Height: 5\' 9"  (1.753 m)    GEN:  The patient appears stated age and is in NAD. HEENT:  Normocephalic, atraumatic.  The mucous membranes are moist. The superficial temporal arteries are without ropiness or tenderness. CV:  RRR Lungs:  CTAB Neck/HEME:  There are no carotid bruits bilaterally.  Neurological examination:  Orientation: The patient is alert and oriented x3.  Wife assists with finer aspects and they do not always agree  Cranial nerves: There is good facial symmetry with facial hypomimia. The speech is fluent and hypophonic. Soft palate rises symmetrically and there is no tongue deviation. Hearing is intact to conversational tone. Sensation: Sensation is intact to light touch throughout Motor: Strength is at least antigravity x4.  Movement examination: Tone: There is mild increased tone in the RUE  Abnormal movements: none Coordination:  There is decremation with RAM's, with any form of RAMS, including alternating supination and pronation of the forearm, hand opening and closing, finger taps, heel taps and toe taps, R>L.  There is some apraxia when asked to perform commands of coordination. Gait and Station: The patient has no difficulty arising out of a deep-seated chair without the use of the hands. The patient's stride length is slightly decreased, no shuffling.  He has slight decreased arm swing bilaterally.      Total time spent on today's visit was 30 minutes, including both face-to-face time and nonface-to-face time.  Time included that spent on review of records (prior notes available to me/labs/imaging if pertinent), discussing treatment and goals, answering patient's questions and  coordinating care.  Cc:  Darrow Bussing, MD

## 2023-06-04 ENCOUNTER — Telehealth: Payer: Self-pay | Admitting: Licensed Clinical Social Worker

## 2023-06-04 ENCOUNTER — Encounter: Payer: Self-pay | Admitting: Neurology

## 2023-06-04 ENCOUNTER — Ambulatory Visit (INDEPENDENT_AMBULATORY_CARE_PROVIDER_SITE_OTHER): Payer: Self-pay | Admitting: Neurology

## 2023-06-04 ENCOUNTER — Ambulatory Visit: Payer: Self-pay | Admitting: Licensed Clinical Social Worker

## 2023-06-04 VITALS — BP 118/84 | HR 86 | Ht 69.0 in | Wt 192.0 lb

## 2023-06-04 DIAGNOSIS — G20A1 Parkinson's disease without dyskinesia, without mention of fluctuations: Secondary | ICD-10-CM

## 2023-06-04 DIAGNOSIS — R413 Other amnesia: Secondary | ICD-10-CM

## 2023-06-04 MED ORDER — CARBIDOPA-LEVODOPA 25-100 MG PO TABS: ORAL_TABLET | ORAL | 1 refills | Status: DC

## 2023-06-04 NOTE — Patient Instructions (Addendum)
 Take carbidopa/levodopa 25/100, 2 at 8am, 2 at noon, 1 at 4pm     As a reminder, carbidopa/levodopa can be taken at the same time as a carbohydrate, but we like to have you take your pill either 30 minutes before a protein source or 1 hour after as protein can interfere with carbidopa/levodopa absorption.   You have been referred for a neurocognitive evaluation (i.e., evaluation of memory and thinking abilities). Please bring someone with you to this appointment if possible, as it is helpful for the neuropsychologist to hear from both you and another adult who knows you well. Please bring eyeglasses and hearing aids if you wear them and take any medications as you normally would. Please fully abstain from all alcohol, marijuana, or other substances prior to your appointment.   The evaluation will take approximately 2-3 hours and has two parts:   The first part is a clinical interview with the neuropsychologist, Dr. Milbert Coulter or Dr. Robbie Lis. During the interview, the neuropsychologist will speak with you and the individual you brought to the appointment.    The second part of the evaluation is testing with the doctor's technician, aka psychometrician, Annabelle Harman or [employer]. During the testing, the technician will ask you to remember different types of material, solve problems, and answer some questionnaires. Your family member will not be present for this portion of the evaluation.   Please note: We have to reserve several hours of the neuropsychologist's time and the psychometrician's time for your evaluation appointment. As such, there is a No-Show fee of $100. If you are unable to attend any of your appointments, please contact our office as soon as possible to reschedule.

## 2023-06-04 NOTE — Telephone Encounter (Signed)
 SW spoke with patient's wife and provided contact person for assisting her with applying for medicaid. Daria Pastures [phone] with [employer]

## 2023-06-07 ENCOUNTER — Telehealth: Payer: Self-pay | Admitting: Licensed Clinical Social Worker

## 2023-06-07 NOTE — Telephone Encounter (Signed)
 Spoke to [place], confirmed she is completing the medicaid application with [employer].

## 2023-06-22 ENCOUNTER — Other Ambulatory Visit: Payer: Self-pay | Admitting: Physician Assistant

## 2023-07-20 ENCOUNTER — Ambulatory Visit: Payer: Self-pay

## 2023-07-20 ENCOUNTER — Institutional Professional Consult (permissible substitution): Payer: Self-pay | Admitting: Psychology

## 2023-07-27 ENCOUNTER — Encounter: Payer: Self-pay | Admitting: Psychology

## 2023-08-18 NOTE — Progress Notes (Signed)
 Assessment/Plan:  Idiopathic Parkinsons Disease              -Suspect that this is idiopathic Parkinson's disease.  Memory loss at onset no longer precludes the diagnosis.              - Continue carbidopa /levodopa  25/100, 2/2/1              -Long discussion about the importance of safe, cardiovascular exercise and the fact that exercise is likely the only thing that slows progression of disease.  We discussed this last visit and reiterated this for quite some time again this visit.      2.  REM behavior disorder             -they are using bedrails so he is safe             -may be made worse by sertraline.  This was just increased by pcp, which may not have been a bad idea given mood, but did discuss with him that it could potentially worsen RBD.   3. Depression             -Following with Dr. Levie Ream.  -Unfortunately, the patient's son passed away within the last year, which has obviously contributed to worsening of mood.   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 Memory change             -neurocog testing with dr. Kitty Perkins in 02/2022 with [facility], severe end of spectrum.  I suspect may have PDD now.  Was scheduled for neurocognitive testing in May, 2025 but had to cancel because he no longer has insurance.  Waiting for disability to kick in.  Son asks about this, and I told him to call me when patient has insurance again and testing is affordable.  -Tex Filbert and has him on memantine , 10 mg twice per day and donepezil , 10 mg daily  -pt no longer driving and wife still working - limits ability to get to Parkinsons Disease exercises  Subjective:   Tyler Dickerson was seen today in follow up for Parkinsons disease.  My previous records were reviewed prior to todays visit as well as outside records available to me.  Patient with son who supplements history.  I  did increase the patient's levodopa  since our last visit.  He reports he is taking this and tolerating it well.  He has had no falls since last visit.  No hallucinations.  No lightheadedness or near syncope.  He is not exercising.  Son states that they had to cx cog testing as he no longer has insurance, but they want to reschedule as soon as they can.  His disability insurance has not kicked in.  Current prescribed movement disorder medications: Carbidopa /levodopa  25/100, 2/2/1 (increased) Mirtazapine , 15 mg at bed  Memantine , 10 mg twice per day Donepezil , 10 mg daily  PREVIOUS MEDICATIONS: Sinemet  Current prescribed movement disorder medications: Carbidopa /levodopa  25/100, 1 tablet 3 times per day. Mirtazapine , 7.5 mg at bedtime Tex Filbert)  PREVIOUS MEDICATIONS: Sinemet   ALLERGIES:  No Known Allergies  CURRENT MEDICATIONS:  Current Meds  Medication Sig   amLODipine (NORVASC) 5 MG tablet Take 5 mg by mouth daily.   aspirin 81 MG chewable tablet Chew by mouth daily.   atorvastatin (LIPITOR) 20 MG tablet  Take 1 tablet by mouth daily.   carbidopa -levodopa  (SINEMET  IR) 25-100 MG tablet 2 at 8am/2 at noon/1 at 4pm   donepezil  (ARICEPT ) 10 MG tablet Take half tablet (5 mg) daily for 2 weeks, then increase to the full tablet at 10 mg daily   ibuprofen  (ADVIL ,MOTRIN ) 600 MG tablet Take 1 tablet (600 mg total) by mouth every 8 (eight) hours as needed for pain. Take with meals   memantine  (NAMENDA ) 5 MG tablet TAKE 1 TABLET (5 MG AT NIGHT) FOR 2 WEEKS, THEN INCREASE TO 1 TABLET (5 MG) TWICE A DAY   mirtazapine  (REMERON ) 15 MG tablet Take 1 tablet (15 mg total) by mouth at bedtime.   sertraline (ZOLOFT) 50 MG tablet Take 100 mg by mouth daily.     Objective:   PHYSICAL EXAMINATION:    VITALS:   Vitals:   08/23/23 0853  BP: 118/72  Pulse: 90  SpO2: 99%  Weight: 192 lb 9.6 oz (87.4 kg)  Height: 5' 9 (1.753 m)     GEN:  The patient appears stated age and is in NAD. HEENT:   Normocephalic, atraumatic.  There is excess saliva, but it generally stays within the mouth.  The mucous membranes are moist. The superficial temporal arteries are without ropiness or tenderness. CV:  RRR Lungs:  CTAB Neck/HEME:  There are no carotid bruits bilaterally.  Neurological examination:  Orientation: The patient is alert and oriented x3.  He is a bit slow to answer Cranial nerves: There is good facial symmetry with facial hypomimia. The speech is fluent and hypophonic. Soft palate rises symmetrically and there is no tongue deviation. Hearing is intact to conversational tone. Sensation: Sensation is intact to light touch throughout Motor: Strength is at least antigravity x4.  Movement examination: Tone: There is normal tone today in the UE/LE Abnormal movements: none Coordination:  There is mild decremation with RAM's, with any form of RAMS, including alternating supination and pronation of the forearm, hand opening and closing, finger taps, heel taps and toe taps.   Gait and Station: The patient has no difficulty arising out of a deep-seated chair without the use of the hands. The patient's stride length is slightly decreased, no shuffling.  He has slight decreased arm swing bilaterally.  He is forward     Total time spent on today's visit was 30 minutes, including both face-to-face time and nonface-to-face time.  Time included that spent on review of records (prior notes available to me/labs/imaging if pertinent), discussing treatment and goals, answering patient's questions and coordinating care.  Cc:  Lanae Pinal, MD

## 2023-08-23 ENCOUNTER — Ambulatory Visit (INDEPENDENT_AMBULATORY_CARE_PROVIDER_SITE_OTHER): Payer: Self-pay | Admitting: Neurology

## 2023-08-23 ENCOUNTER — Encounter: Payer: Self-pay | Admitting: Neurology

## 2023-08-23 VITALS — BP 118/72 | HR 90 | Ht 69.0 in | Wt 192.6 lb

## 2023-08-23 DIAGNOSIS — R413 Other amnesia: Secondary | ICD-10-CM

## 2023-08-23 DIAGNOSIS — G20A1 Parkinson's disease without dyskinesia, without mention of fluctuations: Secondary | ICD-10-CM

## 2023-08-23 NOTE — Patient Instructions (Addendum)
 You need to exercise!  SAVE THE DATE!  We are planning a Parkinsons Disease educational symposium at [facility] in [place] on September 19.  More details to come!  We will have a movement disorder physician expert from [employer] coming to speak and a caregiver speaker.  We will have a panel of experts that will show you who you may need on your team of people on your journey with Parkinsons.  If you would like to be added to our email list to get further information, email [email or web address].  I hope to see you there!

## 2023-10-18 ENCOUNTER — Institutional Professional Consult (permissible substitution): Admitting: Psychology

## 2023-10-18 ENCOUNTER — Ambulatory Visit: Payer: Self-pay

## 2023-10-18 ENCOUNTER — Institutional Professional Consult (permissible substitution): Payer: Self-pay | Admitting: Psychology

## 2023-10-26 ENCOUNTER — Encounter: Admitting: Psychology

## 2023-11-15 ENCOUNTER — Ambulatory Visit: Payer: Self-pay | Admitting: Physician Assistant

## 2024-01-13 ENCOUNTER — Institutional Professional Consult (permissible substitution): Payer: Self-pay | Admitting: Psychology

## 2024-01-13 ENCOUNTER — Ambulatory Visit: Payer: Self-pay

## 2024-01-21 ENCOUNTER — Encounter: Admitting: Psychology

## 2024-02-21 NOTE — Progress Notes (Unsigned)
 Assessment/Plan:  Idiopathic Parkinsons Disease  -Suspect that this is idiopathic Parkinson's disease.  Memory loss at onset no longer precludes the diagnosis.  - Continue carbidopa /levodopa  25/100, 2/2/1       REM behavior disorder -they are using bedrails so he is safe -add melatonin 3-5 mg at bed -may be made worse by sertraline.  This was just increased by pcp, which may not have been a bad idea given mood, but did discuss with him that it could potentially worsen RBD.   Depression -was following with Dr. Tasia but isn't any longer due to finances/insurance. -Unfortunately, the patient's son passed away fairly recently, which has obviously contributed to worsening of mood.     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 Richie in 02/2022 with [facility], severe end of spectrum.  I suspect may have PDD now.  Was scheduled for neurocognitive testing in May, 2025 but had to cancel because he no longer has insurance.  Waiting for disability to kick in.  Son asks about this, and I told him to call me when patient has insurance again and testing is affordable. -increase memantine , 10 mg twice per day and continue donepezil , 10 mg daily -pt no longer driving and wife still working - limits ability to get to Parkinsons Disease exercises.   -discussed contacting [facility] -called social worker to see if she had any resources and she is going to reach out to wife -want 24 hour/day care.  Discussed [facility].  He has no MCR/MCD so doesn't qualify for that or PACE.  He is sleeping all day which is likely making cognition worse. -wife raising grandchildren which is also contributing to stress.    Subjective:   Tyler Dickerson was seen today in follow up for Parkinsons disease.  My previous records were reviewed prior  to todays visit as well as outside records available to me.  Patient with son and wife who supplements history.  He reports taking the levodopa  faithfully.  He has had no falls.  No lightheadedness or near syncope.  Memory is an issue.  We had neurocognitive testing on hold because of lack of insurance.  He is supposed to be on mirtazapine  but is off of it - doing well for sleep but is having vivid dreams.   He is supposed to be on memantine  10 bid but is only taking it qday.  No hallucinations.  He is sleeping all day but wife has to work and no resources available.  She is also raising the grandchildren.  Current prescribed movement disorder medications: Carbidopa /levodopa  25/100, 2/2/1 Mirtazapine , 15 mg at bed  Memantine , 5 mg twice per day (but on 10 mg daily) Donepezil , 10 mg daily  PREVIOUS MEDICATIONS: Sinemet   ALLERGIES:  No Known Allergies  CURRENT MEDICATIONS:  Current Meds  Medication Sig   amLODipine (NORVASC) 5 MG tablet Take 5 mg by mouth daily.   aspirin 81 MG chewable tablet Chew by mouth daily.   atorvastatin (LIPITOR) 20 MG tablet Take 1 tablet by mouth daily.   carbidopa -levodopa  (SINEMET  IR) 25-100 MG tablet 2 at 8am/2 at noon/1 at 4pm   donepezil  (ARICEPT ) 10 MG tablet Take half tablet (5 mg) daily for 2 weeks, then increase to the full tablet at 10 mg daily   ibuprofen  (ADVIL ,MOTRIN ) 600 MG tablet Take 1 tablet (  600 mg total) by mouth every 8 (eight) hours as needed for pain. Take with meals   sertraline (ZOLOFT) 50 MG tablet Take 100 mg by mouth daily.     Objective:   PHYSICAL EXAMINATION:    VITALS:   Vitals:   02/24/24 0807  BP: 116/72  Pulse: 89  SpO2: 98%    GEN:  The patient appears stated age and is in NAD. HEENT:  Normocephalic, atraumatic.  There is excess saliva, but it generally stays within the mouth.  The mucous membranes are moist. The superficial temporal arteries are without ropiness or tenderness. CV:  RRR Lungs:  CTAB Neck/HEME:   There are no carotid bruits bilaterally.  Neurological examination:  Orientation: The patient is alert and oriented x3.  He is a bit slow to answer Cranial nerves: There is good facial symmetry with facial hypomimia. The speech is fluent and hypophonic. Soft palate rises symmetrically and there is no tongue deviation. Hearing is intact to conversational tone. Sensation: Sensation is intact to light touch throughout Motor: Strength is at least antigravity x4.  Movement examination: Tone: There is normal tone today in the UE/LE Abnormal movements: none Coordination:  There is mild decremation with RAM's, with any form of RAMS, including alternating supination and pronation of the forearm, hand opening and closing, finger taps, heel taps and toe taps.   Gait and Station: The patient has no difficulty arising out of a deep-seated chair without the use of the hands. The patient's stride length is slightly decreased, no shuffling.  He has slight decreased arm swing bilaterally.  He is forward     Total time spent on today's visit was 55 minutes, including both face-to-face time and nonface-to-face time.  Time included that spent on review of records (prior notes available to me/labs/imaging if pertinent), discussing treatment and goals, answering patient's questions and coordinating care.  Cc:  Regino Slater, MD

## 2024-02-24 ENCOUNTER — Ambulatory Visit: Payer: Self-pay | Admitting: Neurology

## 2024-02-24 VITALS — BP 116/72 | HR 89 | Ht 69.0 in | Wt 182.0 lb

## 2024-02-24 DIAGNOSIS — F02B0 Dementia in other diseases classified elsewhere, moderate, without behavioral disturbance, psychotic disturbance, mood disturbance, and anxiety: Secondary | ICD-10-CM

## 2024-02-24 DIAGNOSIS — G20A1 Parkinson's disease without dyskinesia, without mention of fluctuations: Secondary | ICD-10-CM

## 2024-02-24 MED ORDER — CARBIDOPA-LEVODOPA 25-100 MG PO TABS: ORAL_TABLET | ORAL | 1 refills | Status: AC

## 2024-02-24 MED ORDER — MEMANTINE HCL 10 MG PO TABS: 10.0000 mg | ORAL_TABLET | Freq: Two times a day (BID) | ORAL | 1 refills | Status: AC

## 2024-02-24 NOTE — Patient Instructions (Addendum)
°  °  VISIT SUMMARY: Today, we discussed your current medication regimen and evaluated your sleep disturbances. We addressed issues with your medication schedule and provided recommendations to help manage your sleep patterns and overall health.  YOUR PLAN: -IDIOPATHIC PARKINSON'S DISEASE: Parkinson's disease is a disorder of the nervous system that affects movement. We have refilled your carbidopa -levodopa  prescription and recommend that you continue your walking regimen three times a week for at least 30 minutes to help with your posture.  -CIRCADIAN RHYTHM SLEEP DISORDER: This disorder affects your sleep-wake cycle, causing day-night reversal and vivid dreams. We suggest starting melatonin at 3-5 mg at bedtime to help with vivid dreams. Ensure that bed rails are in place for your safety.  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 We have adjusted your memantine  prescription to 10 mg twice daily. Additionally, we recommend reaching out to [facility] and a tourist information centre manager for additional resources.  INSTRUCTIONS: Please follow up with [facility] [place] and the tourist information centre manager for additional resources. Continue your walking regimen and start taking melatonin at bedtime as discussed. Adjust your memantine  dosage to 10 mg twice daily.                      Contains text generated by [employer].                                 Contains text generated by [employer]bridge.                        Contains text generated by Abridge.                                 Contains text generated by [employer]bridge.

## 2024-03-13 ENCOUNTER — Emergency Department (HOSPITAL_COMMUNITY): Payer: MEDICAID

## 2024-03-13 ENCOUNTER — Encounter (HOSPITAL_COMMUNITY): Payer: Self-pay | Admitting: Emergency Medicine

## 2024-03-13 ENCOUNTER — Other Ambulatory Visit: Payer: Self-pay

## 2024-03-13 ENCOUNTER — Inpatient Hospital Stay (HOSPITAL_COMMUNITY)
Admission: EM | Admit: 2024-03-13 | Discharge: 2024-03-16 | DRG: 871 | Disposition: A | Discharge status: Home / Self Care | Payer: MEDICAID | Attending: Internal Medicine | Admitting: Internal Medicine

## 2024-03-13 DIAGNOSIS — R292 Abnormal reflex: Secondary | ICD-10-CM | POA: Diagnosis present

## 2024-03-13 DIAGNOSIS — F0280 Dementia in other diseases classified elsewhere without behavioral disturbance: Secondary | ICD-10-CM | POA: Diagnosis present

## 2024-03-13 DIAGNOSIS — Z8249 Family history of ischemic heart disease and other diseases of the circulatory system: Secondary | ICD-10-CM

## 2024-03-13 DIAGNOSIS — G20A1 Parkinson's disease without dyskinesia, without mention of fluctuations: Secondary | ICD-10-CM | POA: Diagnosis present

## 2024-03-13 DIAGNOSIS — I700 Atherosclerosis of aorta: Secondary | ICD-10-CM | POA: Diagnosis present

## 2024-03-13 DIAGNOSIS — Z8616 Personal history of COVID-19: Secondary | ICD-10-CM

## 2024-03-13 DIAGNOSIS — D649 Anemia, unspecified: Secondary | ICD-10-CM | POA: Insufficient documentation

## 2024-03-13 DIAGNOSIS — Z96652 Presence of left artificial knee joint: Secondary | ICD-10-CM | POA: Diagnosis present

## 2024-03-13 DIAGNOSIS — A419 Sepsis, unspecified organism: Principal | ICD-10-CM | POA: Diagnosis present

## 2024-03-13 DIAGNOSIS — I708 Atherosclerosis of other arteries: Secondary | ICD-10-CM | POA: Diagnosis present

## 2024-03-13 DIAGNOSIS — J189 Pneumonia, unspecified organism: Principal | ICD-10-CM | POA: Insufficient documentation

## 2024-03-13 DIAGNOSIS — M545 Low back pain, unspecified: Secondary | ICD-10-CM | POA: Diagnosis present

## 2024-03-13 DIAGNOSIS — Z1152 Encounter for screening for COVID-19: Secondary | ICD-10-CM

## 2024-03-13 DIAGNOSIS — G20C Parkinsonism, unspecified: Secondary | ICD-10-CM | POA: Diagnosis present

## 2024-03-13 DIAGNOSIS — F0390 Unspecified dementia without behavioral disturbance: Secondary | ICD-10-CM | POA: Insufficient documentation

## 2024-03-13 DIAGNOSIS — Z634 Disappearance and death of family member: Secondary | ICD-10-CM

## 2024-03-13 DIAGNOSIS — I447 Left bundle-branch block, unspecified: Secondary | ICD-10-CM | POA: Diagnosis present

## 2024-03-13 DIAGNOSIS — Z87891 Personal history of nicotine dependence: Secondary | ICD-10-CM

## 2024-03-13 DIAGNOSIS — N289 Disorder of kidney and ureter, unspecified: Secondary | ICD-10-CM | POA: Diagnosis present

## 2024-03-13 DIAGNOSIS — D638 Anemia in other chronic diseases classified elsewhere: Secondary | ICD-10-CM | POA: Diagnosis present

## 2024-03-13 DIAGNOSIS — G9341 Metabolic encephalopathy: Secondary | ICD-10-CM | POA: Diagnosis present

## 2024-03-13 DIAGNOSIS — R6520 Severe sepsis without septic shock: Secondary | ICD-10-CM | POA: Diagnosis present

## 2024-03-13 DIAGNOSIS — J159 Unspecified bacterial pneumonia: Secondary | ICD-10-CM | POA: Diagnosis present

## 2024-03-13 DIAGNOSIS — R7401 Elevation of levels of liver transaminase levels: Secondary | ICD-10-CM | POA: Diagnosis present

## 2024-03-13 DIAGNOSIS — I10 Essential (primary) hypertension: Secondary | ICD-10-CM | POA: Diagnosis present

## 2024-03-13 DIAGNOSIS — G4752 REM sleep behavior disorder: Secondary | ICD-10-CM | POA: Diagnosis present

## 2024-03-13 DIAGNOSIS — E7800 Pure hypercholesterolemia, unspecified: Secondary | ICD-10-CM | POA: Diagnosis present

## 2024-03-13 DIAGNOSIS — F02818 Dementia in other diseases classified elsewhere, unspecified severity, with other behavioral disturbance: Secondary | ICD-10-CM | POA: Diagnosis present

## 2024-03-13 DIAGNOSIS — E8721 Acute metabolic acidosis: Secondary | ICD-10-CM | POA: Diagnosis present

## 2024-03-13 DIAGNOSIS — Z79899 Other long term (current) drug therapy: Secondary | ICD-10-CM

## 2024-03-13 DIAGNOSIS — F4321 Adjustment disorder with depressed mood: Secondary | ICD-10-CM | POA: Diagnosis present

## 2024-03-13 DIAGNOSIS — G3183 Dementia with Lewy bodies: Secondary | ICD-10-CM | POA: Diagnosis present

## 2024-03-13 DIAGNOSIS — G9340 Encephalopathy, unspecified: Secondary | ICD-10-CM | POA: Insufficient documentation

## 2024-03-13 DIAGNOSIS — Z860100 Personal history of colon polyps, unspecified: Secondary | ICD-10-CM

## 2024-03-13 DIAGNOSIS — R17 Unspecified jaundice: Secondary | ICD-10-CM | POA: Diagnosis present

## 2024-03-13 DIAGNOSIS — R350 Frequency of micturition: Secondary | ICD-10-CM | POA: Diagnosis present

## 2024-03-13 DIAGNOSIS — N179 Acute kidney failure, unspecified: Secondary | ICD-10-CM | POA: Insufficient documentation

## 2024-03-13 LAB — URINALYSIS, W/ REFLEX TO CULTURE (INFECTION SUSPECTED)
Bilirubin Urine: NEGATIVE
Glucose, UA: 50 mg/dL — AB
Ketones, ur: 5 mg/dL — AB
Leukocytes,Ua: NEGATIVE
Nitrite: NEGATIVE
Protein, ur: 100 mg/dL — AB
Specific Gravity, Urine: 1.024 (ref 1.005–1.030)
pH: 6 (ref 5.0–8.0)

## 2024-03-13 LAB — RESP PANEL BY RT-PCR (RSV, FLU A&B, COVID)  RVPGX2
Influenza A by PCR: NEGATIVE
Influenza B by PCR: NEGATIVE
Resp Syncytial Virus by PCR: NEGATIVE
SARS Coronavirus 2 by RT PCR: NEGATIVE

## 2024-03-13 LAB — CBC WITH DIFFERENTIAL/PLATELET
Abs Immature Granulocytes: 0.19 K/uL — ABNORMAL HIGH (ref 0.00–0.07)
Abs Immature Granulocytes: 0.11 K/uL — ABNORMAL HIGH (ref 0.00–0.07)
Basophils Absolute: 0 K/uL (ref 0.0–0.1)
Basophils Absolute: 0.1 K/uL (ref 0.0–0.1)
Basophils Relative: 0 %
Basophils Relative: 0 %
Eosinophils Absolute: 0 K/uL (ref 0.0–0.5)
Eosinophils Absolute: 0 K/uL (ref 0.0–0.5)
Eosinophils Relative: 0 %
Eosinophils Relative: 0 %
HCT: 38.3 % — ABNORMAL LOW (ref 39.0–52.0)
HCT: 32.7 % — ABNORMAL LOW (ref 39.0–52.0)
Hemoglobin: 12.7 g/dL — ABNORMAL LOW (ref 13.0–17.0)
Hemoglobin: 10.8 g/dL — ABNORMAL LOW (ref 13.0–17.0)
Immature Granulocytes: 1 %
Immature Granulocytes: 1 %
Lymphocytes Relative: 6 %
Lymphocytes Relative: 13 %
Lymphs Abs: 1 K/uL (ref 0.7–4.0)
Lymphs Abs: 2.4 K/uL (ref 0.7–4.0)
MCH: 29 pg (ref 26.0–34.0)
MCH: 29 pg (ref 26.0–34.0)
MCHC: 33.2 g/dL (ref 30.0–36.0)
MCHC: 33 g/dL (ref 30.0–36.0)
MCV: 87.4 fL (ref 80.0–100.0)
MCV: 87.7 fL (ref 80.0–100.0)
Monocytes Absolute: 0.9 K/uL (ref 0.1–1.0)
Monocytes Absolute: 1.4 K/uL — ABNORMAL HIGH (ref 0.1–1.0)
Monocytes Relative: 6 %
Monocytes Relative: 7 %
Neutro Abs: 13.6 K/uL — ABNORMAL HIGH (ref 1.7–7.7)
Neutro Abs: 14.7 K/uL — ABNORMAL HIGH (ref 1.7–7.7)
Neutrophils Relative %: 87 %
Neutrophils Relative %: 79 %
Platelets: 161 K/uL (ref 150–400)
Platelets: 161 K/uL (ref 150–400)
RBC: 4.38 MIL/uL (ref 4.22–5.81)
RBC: 3.73 MIL/uL — ABNORMAL LOW (ref 4.22–5.81)
RDW: 14.6 % (ref 11.5–15.5)
RDW: 14.5 % (ref 11.5–15.5)
WBC: 15.7 K/uL — ABNORMAL HIGH (ref 4.0–10.5)
WBC: 18.7 K/uL — ABNORMAL HIGH (ref 4.0–10.5)
nRBC: 0 % (ref 0.0–0.2)
nRBC: 0 % (ref 0.0–0.2)

## 2024-03-13 LAB — I-STAT CG4 LACTIC ACID, ED
Lactic Acid, Venous: 2.4 mmol/L (ref 0.5–1.9)
Lactic Acid, Venous: 4.6 mmol/L (ref 0.5–1.9)
Lactic Acid, Venous: 0.8 mmol/L (ref 0.5–1.9)

## 2024-03-13 LAB — COMPREHENSIVE METABOLIC PANEL WITH GFR
ALT: 6 U/L (ref 0–44)
AST: 47 U/L — ABNORMAL HIGH (ref 15–41)
Albumin: 3.6 g/dL (ref 3.5–5.0)
Alkaline Phosphatase: 62 U/L (ref 38–126)
Anion gap: 10 (ref 5–15)
BUN: 16 mg/dL (ref 6–20)
CO2: 22 mmol/L (ref 22–32)
Calcium: 9.1 mg/dL (ref 8.9–10.3)
Chloride: 104 mmol/L (ref 98–111)
Creatinine, Ser: 1.34 mg/dL — ABNORMAL HIGH (ref 0.61–1.24)
GFR, Estimated: >60 mL/min
Glucose, Bld: 122 mg/dL — ABNORMAL HIGH (ref 70–99)
Potassium: 3.9 mmol/L (ref 3.5–5.1)
Sodium: 136 mmol/L (ref 135–145)
Total Bilirubin: 1.7 mg/dL — ABNORMAL HIGH (ref 0.0–1.2)
Total Protein: 6.7 g/dL (ref 6.5–8.1)

## 2024-03-13 LAB — BASIC METABOLIC PANEL WITH GFR
Anion gap: 9 (ref 5–15)
BUN: 13 mg/dL (ref 6–20)
CO2: 20 mmol/L — ABNORMAL LOW (ref 22–32)
Calcium: 8.3 mg/dL — ABNORMAL LOW (ref 8.9–10.3)
Chloride: 109 mmol/L (ref 98–111)
Creatinine, Ser: 1.11 mg/dL (ref 0.61–1.24)
GFR, Estimated: >60 mL/min
Glucose, Bld: 101 mg/dL — ABNORMAL HIGH (ref 70–99)
Potassium: 4 mmol/L (ref 3.5–5.1)
Sodium: 138 mmol/L (ref 135–145)

## 2024-03-13 LAB — HEPATIC FUNCTION PANEL
ALT: 12 U/L (ref 0–44)
AST: 48 U/L — ABNORMAL HIGH (ref 15–41)
Albumin: 3 g/dL — ABNORMAL LOW (ref 3.5–5.0)
Alkaline Phosphatase: 50 U/L (ref 38–126)
Bilirubin, Direct: 0.6 mg/dL — ABNORMAL HIGH (ref 0.0–0.2)
Indirect Bilirubin: 0.7 mg/dL (ref 0.3–0.9)
Total Bilirubin: 1.3 mg/dL — ABNORMAL HIGH (ref 0.0–1.2)
Total Protein: 5.6 g/dL — ABNORMAL LOW (ref 6.5–8.1)

## 2024-03-13 LAB — TSH: TSH: 1.11 u[IU]/mL (ref 0.350–4.500)

## 2024-03-13 LAB — PROTIME-INR
INR: 1.2 (ref 0.8–1.2)
Prothrombin Time: 15.6 s — ABNORMAL HIGH (ref 11.4–15.2)

## 2024-03-13 LAB — LACTIC ACID, PLASMA
Lactic Acid, Venous: 1.1 mmol/L (ref 0.5–1.9)
Lactic Acid, Venous: 1.6 mmol/L (ref 0.5–1.9)

## 2024-03-13 LAB — HIV ANTIBODY (ROUTINE TESTING W REFLEX): HIV Screen 4th Generation wRfx: NONREACTIVE

## 2024-03-13 MED ORDER — ACETAMINOPHEN 650 MG RE SUPP
650.0000 mg | Freq: Once | RECTAL | Status: AC
  Administered 2024-03-13: 650 mg via RECTAL
  Filled 2024-03-13: qty 1

## 2024-03-13 MED ORDER — SODIUM CHLORIDE 0.9 % IV BOLUS
1000.0000 mL | Freq: Once | INTRAVENOUS | Status: AC
  Administered 2024-03-13: 1000 mL via INTRAVENOUS

## 2024-03-13 MED ORDER — ENOXAPARIN SODIUM 40 MG/0.4ML IJ SOSY
40.0000 mg | PREFILLED_SYRINGE | INTRAMUSCULAR | Status: DC
  Administered 2024-03-13: 40 mg via SUBCUTANEOUS
  Filled 2024-03-13 (×2): qty 0.4

## 2024-03-13 MED ORDER — MELATONIN 5 MG PO TABS
5.0000 mg | ORAL_TABLET | Freq: Every day | ORAL | Status: DC
  Administered 2024-03-13 – 2024-03-14 (×2): 5 mg via ORAL
  Filled 2024-03-13 (×2): qty 1

## 2024-03-13 MED ORDER — MEMANTINE HCL 10 MG PO TABS
10.0000 mg | ORAL_TABLET | Freq: Two times a day (BID) | ORAL | Status: DC
  Administered 2024-03-13 – 2024-03-16 (×7): 10 mg via ORAL
  Filled 2024-03-13 (×2): qty 1
  Filled 2024-03-13: qty 2
  Filled 2024-03-13 (×4): qty 1

## 2024-03-13 MED ORDER — SODIUM CHLORIDE 0.9 % IV SOLN
1.0000 g | Freq: Once | INTRAVENOUS | Status: AC
  Administered 2024-03-13: 1 g via INTRAVENOUS
  Filled 2024-03-13: qty 10

## 2024-03-13 MED ORDER — SODIUM CHLORIDE 0.9 % IV SOLN
500.0000 mg | Freq: Once | INTRAVENOUS | Status: AC
  Administered 2024-03-13: 500 mg via INTRAVENOUS
  Filled 2024-03-13: qty 5

## 2024-03-13 MED ORDER — IOHEXOL 300 MG/ML  SOLN
100.0000 mL | Freq: Once | INTRAMUSCULAR | Status: AC | PRN
  Administered 2024-03-13: 100 mL via INTRAVENOUS

## 2024-03-13 MED ORDER — CARBIDOPA-LEVODOPA 25-100 MG PO TABS
2.0000 | ORAL_TABLET | Freq: Two times a day (BID) | ORAL | Status: DC
  Administered 2024-03-13 – 2024-03-16 (×8): 2 via ORAL
  Filled 2024-03-13 (×8): qty 2

## 2024-03-13 MED ORDER — SODIUM CHLORIDE 0.9 % IV BOLUS
500.0000 mL | Freq: Once | INTRAVENOUS | Status: AC
  Administered 2024-03-13: 500 mL via INTRAVENOUS

## 2024-03-13 MED ORDER — SODIUM CHLORIDE 0.9 % IV SOLN: INTRAVENOUS | Status: DC

## 2024-03-13 MED ORDER — ACETAMINOPHEN 650 MG RE SUPP: 650.0000 mg | Freq: Four times a day (QID) | RECTAL | Status: DC | PRN

## 2024-03-13 MED ORDER — DONEPEZIL HCL 10 MG PO TABS
10.0000 mg | ORAL_TABLET | Freq: Every evening | ORAL | Status: DC
  Administered 2024-03-13 – 2024-03-15 (×3): 10 mg via ORAL
  Filled 2024-03-13 (×3): qty 1

## 2024-03-13 MED ORDER — CARBIDOPA-LEVODOPA 25-100 MG PO TABS
1.0000 | ORAL_TABLET | Freq: Every day | ORAL | Status: DC
  Administered 2024-03-13 – 2024-03-15 (×3): 1 via ORAL
  Filled 2024-03-13 (×3): qty 1

## 2024-03-13 MED ORDER — SODIUM CHLORIDE 0.9 % IV SOLN
500.0000 mg | INTRAVENOUS | Status: DC
  Administered 2024-03-14: 500 mg via INTRAVENOUS
  Filled 2024-03-13: qty 5

## 2024-03-13 MED ORDER — LACTATED RINGERS IV BOLUS (SEPSIS)
1000.0000 mL | Freq: Once | INTRAVENOUS | Status: AC
  Administered 2024-03-13: 1000 mL via INTRAVENOUS

## 2024-03-13 MED ORDER — ACETAMINOPHEN 325 MG PO TABS
650.0000 mg | ORAL_TABLET | Freq: Four times a day (QID) | ORAL | Status: DC | PRN
  Administered 2024-03-14 (×2): 650 mg via ORAL
  Filled 2024-03-13 (×2): qty 2

## 2024-03-13 MED ORDER — SODIUM CHLORIDE 0.9 % IV SOLN
1.0000 g | INTRAVENOUS | Status: DC
  Administered 2024-03-13 – 2024-03-16 (×4): 1 g via INTRAVENOUS
  Filled 2024-03-13 (×4): qty 10

## 2024-03-13 NOTE — Plan of Care (Signed)
 Discussed with patient's wife Mrs. Lant confirmed CODE STATUS as full code.  Redia Cleaver MD.

## 2024-03-13 NOTE — ED Notes (Signed)
 Fall socks on pt, bed alarm activated. Pt family at bedside.

## 2024-03-13 NOTE — H&P (Addendum)
 " History and Physical    Tyler Dickerson FMW:[id number] DOB: 07-28-1965 DOA: 03/13/2024  Patient coming from: Home  Chief Complaint: Fever and confusion.  HPI: Tyler Dickerson is a 59 y.o. male with history of Parkinson disease, dementia, REM sleep disorder was brought to the ER after patient started having fever chills and increasingly confused.  Patient's wife who is at the bedside states that patient has become more confused recently but over the last 24 hours he has been more confused and also started spiking of fevers and chills.  Has not had any nausea vomiting diarrhea chest pain shortness of breath.  Has been having increasing frequency of urination.  Recently patient's memantine  dose was increased and melatonin added.  ED Course: In the ER patient had a temperature of 102.9 with labs showing elevated lactic acid of 2.4 worsening to 4.6.  Per patient's wife and ER physician patient was initially confused.  After starting fluids and antibiotics patient's mentation is back to baseline.  WBC is 15.7 CT head unremarkable.  CT abdomen was done because there was some concern for abdominal tenderness but was not showing at the acute in the abdomen but does show features concerning for left lower lobe pneumonia.  Flu and COVID test were negative.  Patient started on fluid bolus per sepsis protocol and started on ceftriaxone  and Zithromax .  Admitted for further management.  Review of Systems: As per HPI, rest all negative.   Past Medical History:  Diagnosis Date   Atypical chest pain 11/03/2016   Dyslipidemia 11/03/2016   Family history of abdominal aortic aneurysm (AAA) 11/03/2016   Family history of peripheral arterial disease 11/03/2016   History of COVID-19 10/2020   Hypercholesteremia    Mild   Hyperplastic colon polyp    Hypertension    Mild cognitive impairment, concerns for Lewy body disease 02/23/2022   Parkinsonism (HCC)    REM sleep behavior disorder    Tremor     Past  Surgical History:  Procedure Laterality Date   COLONOSCOPY  2015   REPLACEMENT TOTAL KNEE Left 1996     reports that he quit smoking about 6 years ago. His smoking use included cigarettes. He smoked an average of 0.5 packs per day. He has never used smokeless tobacco. He reports current alcohol use of about 4.0 - 6.0 standard drinks of alcohol per week. He reports that he does not use drugs.  Allergies[1]  Family History  Problem Relation Age of Onset   Aneurysm Father        aortic   Supraventricular tachycardia Brother     Prior to Admission medications  Medication Sig Start Date End Date Taking? Authorizing Provider  carbidopa -levodopa  (SINEMET  IR) 25-100 MG tablet 2 at 8am/2 at noon/1 at 4pm Patient taking differently: Take 1-2 tablets by mouth in the morning, at noon, and at bedtime. Take 2 tablets at 8 am, Take 2 tablets at noon & Take 1 tablet at 4pm 02/24/24  Yes Tat, Asberry RAMAN, DO  donepezil  (ARICEPT ) 10 MG tablet Take half tablet (5 mg) daily for 2 weeks, then increase to the full tablet at 10 mg daily Patient taking differently: Take 10 mg by mouth every evening. 05/10/23  Yes Wertman, Sara E, PA-C  melatonin 5 MG TABS Take 5 mg by mouth at bedtime.   Yes [provider]  memantine  (NAMENDA ) 10 MG tablet Take 1 tablet (10 mg total) by mouth 2 (two) times daily. 02/24/24  Yes Tat, Asberry RAMAN, DO  ibuprofen  (ADVIL ,MOTRIN ) 600 MG tablet Take 1 tablet (600 mg total) by mouth every 8 (eight) hours as needed for pain. Take with meals Patient not taking: Reported on 03/13/2024 04/23/12   Moreno-Coll, Adlih, MD  mirtazapine  (REMERON ) 15 MG tablet Take 1 tablet (15 mg total) by mouth at bedtime. Patient not taking: No sig reported 11/06/22   Dina Camie BRAVO, PA-C    Physical Exam: Constitutional: Moderately built and nourished. Vitals:   03/13/24 0400 03/13/24 0430 03/13/24 0440 03/13/24 0450  BP:      Pulse: 100 92 91 90  Resp: (!) 27 (!) 24 (!) 22 (!) 21  Temp: 99.2 F  (37.3 C)     TempSrc: Axillary     SpO2: 94% 95% 94% 94%   Eyes: Anicteric no pallor. ENMT: No discharge from the ears/nose or mouth. Neck: No mass felt.  No neck rigidity. Respiratory: No rhonchi or crepitations. Cardiovascular: S1-S2 heard. Abdomen: Soft nontender bowel sound present. Musculoskeletal: No edema. Skin: No rash. Neurologic: Alert awake oriented to his name and place moving all extremities. Psychiatric: Oriented to name and place.   Labs on Admission: I have personally reviewed following labs and imaging studies  CBC: Recent Labs  Lab 03/13/24 0122  WBC 15.7*  NEUTROABS 13.6*  HGB 12.7*  HCT 38.3*  MCV 87.4  PLT 161   Basic Metabolic Panel: Recent Labs  Lab 03/13/24 0122  NA 136  K 3.9  CL 104  CO2 22  GLUCOSE 122*  BUN 16  CREATININE 1.34*  CALCIUM 9.1   GFR: CrCl cannot be calculated (Unknown ideal weight.). Liver Function Tests: Recent Labs  Lab 03/13/24 0122  AST 47*  ALT 6  ALKPHOS 62  BILITOT 1.7*  PROT 6.7  ALBUMIN 3.6   No results for input(s): LIPASE, AMYLASE in the last 168 hours. No results for input(s): AMMONIA in the last 168 hours. Coagulation Profile: Recent Labs  Lab 03/13/24 0122  INR 1.2   Cardiac Enzymes: No results for input(s): CKTOTAL, CKMB, CKMBINDEX, TROPONINI in the last 168 hours. BNP (last 3 results) No results for input(s): PROBNP in the last 8760 hours. HbA1C: No results for input(s): HGBA1C in the last 72 hours. CBG: No results for input(s): GLUCAP in the last 168 hours. Lipid Profile: No results for input(s): CHOL, HDL, LDLCALC, TRIG, CHOLHDL, LDLDIRECT in the last 72 hours. Thyroid Function Tests: No results for input(s): TSH, T4TOTAL, FREET4, T3FREE, THYROIDAB in the last 72 hours. Anemia Panel: No results for input(s): VITAMINB12, FOLATE, FERRITIN, TIBC, IRON, RETICCTPCT in the last 72 hours. Urine analysis:    Component Value Date/Time    COLORURINE YELLOW 03/13/2024 0121   APPEARANCEUR CLEAR 03/13/2024 0121   LABSPEC 1.024 03/13/2024 0121   PHURINE 6.0 03/13/2024 0121   GLUCOSEU 50 (A) 03/13/2024 0121   HGBUR SMALL (A) 03/13/2024 0121   BILIRUBINUR NEGATIVE 03/13/2024 0121   KETONESUR 5 (A) 03/13/2024 0121   PROTEINUR 100 (A) 03/13/2024 0121   NITRITE NEGATIVE 03/13/2024 0121   LEUKOCYTESUR NEGATIVE 03/13/2024 0121   Sepsis Labs: @LABRCNTIP (procalcitonin:4,lacticidven:4) ) Recent Results (from the past 240 hours)  Resp panel by RT-PCR (RSV, Flu A&B, Covid) Anterior Nasal Swab     Status: None   Collection Time: 03/13/24  2:01 AM   Specimen: Anterior Nasal Swab  Result Value Ref Range Status   SARS Coronavirus 2 by RT PCR NEGATIVE NEGATIVE Final    Comment: (NOTE) SARS-CoV-2 target nucleic acids are NOT DETECTED.  The SARS-CoV-2 RNA is generally detectable  in upper respiratory specimens during the acute phase of infection. The lowest concentration of SARS-CoV-2 viral copies this assay can detect is 138 copies/mL. A negative result does not preclude SARS-Cov-2 infection and should not be used as the sole basis for treatment or other patient management decisions. A negative result may occur with  improper specimen collection/handling, submission of specimen other than nasopharyngeal swab, presence of viral mutation(s) within the areas targeted by this assay, and inadequate number of viral copies(<138 copies/mL). A negative result must be combined with clinical observations, patient history, and epidemiological information. The expected result is Negative.  Fact Sheet for Patients:  bloggercourse.com  Fact Sheet for Healthcare Providers:  seriousbroker.it  This test is no t yet approved or cleared by the [place]  FDA and  has been authorized for detection and/or diagnosis of SARS-CoV-2 by FDA under an Emergency Use Authorization (EUA). This EUA will  remain  in effect (meaning this test can be used) for the duration of the COVID-19 declaration under Section 564(b)(1) of the Act, 21 U.S.C.section 360bbb-3(b)(1), unless the authorization is terminated  or revoked sooner.       Influenza A by PCR NEGATIVE NEGATIVE Final   Influenza B by PCR NEGATIVE NEGATIVE Final    Comment: (NOTE) The Xpert Xpress SARS-CoV-2/FLU/RSV plus assay is intended as an aid in the diagnosis of influenza from Nasopharyngeal swab specimens and should not be used as a sole basis for treatment. Nasal washings and aspirates are unacceptable for Xpert Xpress SARS-CoV-2/FLU/RSV testing.  Fact Sheet for Patients: bloggercourse.com  Fact Sheet for Healthcare Providers: seriousbroker.it  This test is not yet approved or cleared by the [place]  FDA and has been authorized for detection and/or diagnosis of SARS-CoV-2 by FDA under an Emergency Use Authorization (EUA). This EUA will remain in effect (meaning this test can be used) for the duration of the COVID-19 declaration under Section 564(b)(1) of the Act, 21 U.S.C. section 360bbb-3(b)(1), unless the authorization is terminated or revoked.     Resp Syncytial Virus by PCR NEGATIVE NEGATIVE Final    Comment: (NOTE) Fact Sheet for Patients: bloggercourse.com  Fact Sheet for Healthcare Providers: seriousbroker.it  This test is not yet approved or cleared by the [place]  FDA and has been authorized for detection and/or diagnosis of SARS-CoV-2 by FDA under an Emergency Use Authorization (EUA). This EUA will remain in effect (meaning this test can be used) for the duration of the COVID-19 declaration under Section 564(b)(1) of the Act, 21 U.S.C. section 360bbb-3(b)(1), unless the authorization is terminated or revoked.  Performed at [facility], [address], [place], [place]  [address]      Radiological Exams on Admission: CT Head Wo Contrast Result Date: 03/13/2024 EXAM: CT HEAD WITHOUT 03/13/2024 03:29:31 AM TECHNIQUE: CT of the head was performed without the administration of intravenous contrast. Automated exposure control, iterative reconstruction, and/or weight based adjustment of the mA/kV was utilized to reduce the radiation dose to as low as reasonably achievable. COMPARISON: 12/31/2020 CLINICAL HISTORY: Mental status change, unknown cause FINDINGS: BRAIN AND VENTRICLES: No acute intracranial hemorrhage. No mass effect or midline shift. No extra-axial fluid collection. No evidence of acute infarct. No hydrocephalus. ORBITS: No acute abnormality. SINUSES AND MASTOIDS: No acute abnormality. SOFT TISSUES AND SKULL: No acute skull fracture. No acute soft tissue abnormality. IMPRESSION: 1. No acute intracranial abnormality. Electronically signed by: Franky Crease MD 03/13/2024 03:38 AM EST RP Workstation: [id number]   CT ABDOMEN PELVIS W CONTRAST Result Date: 03/13/2024 EXAM:  CT ABDOMEN AND PELVIS WITH CONTRAST 03/13/2024 03:29:31 AM TECHNIQUE: CT of the abdomen and pelvis was performed with the administration of 100 mL of iohexol  (OMNIPAQUE ) 300 MG/ML solution. Multiplanar reformatted images are provided for review. Automated exposure control, iterative reconstruction, and/or weight-based adjustment of the mA/kV was utilized to reduce the radiation dose to as low as reasonably achievable. COMPARISON: 03/17/2019 CLINICAL HISTORY: Sepsis. FINDINGS: LOWER CHEST: Consolidation in the left lower lobe compatible with pneumonia. LIVER: The liver is unremarkable. GALLBLADDER AND BILE DUCTS: Gallbladder is unremarkable. No biliary ductal dilatation. SPLEEN: No acute abnormality. PANCREAS: No acute abnormality. ADRENAL GLANDS: No acute abnormality. KIDNEYS, URETERS AND BLADDER: Bilateral simple appearing renal cysts, the largest 5.7 cm in the right upper pole. Per consensus, no follow-up is  needed for simple Bosniak type 1 and 2 renal cysts, unless the patient has a malignancy history or risk factors. No stones in the kidneys or ureters. No hydronephrosis. No perinephric or periureteral stranding. Urinary bladder is unremarkable. GI AND BOWEL: Stomach demonstrates no acute abnormality. Scattered colonic diverticulosis. Normal appendix. There is no bowel obstruction. PERITONEUM AND RETROPERITONEUM: No ascites. No free air. VASCULATURE: Aorta is normal in caliber. Aortoiliac atherosclerosis. Stenosis in the left common iliac artery, approximately 50%. LYMPH NODES: No lymphadenopathy. REPRODUCTIVE ORGANS: No acute abnormality. BONES AND SOFT TISSUES: No acute osseous abnormality. No focal soft tissue abnormality. IMPRESSION: 1. Left lower lobe pneumonia. 2. Aortoiliac atherosclerosis with approximately 50% stenosis in the left common iliac artery. Electronically signed by: Franky Crease MD 03/13/2024 03:37 AM EST RP Workstation: [id number]   DG Chest Port 1 View Result Date: 03/13/2024 EXAM: 1 VIEW(S) XRAY OF THE CHEST 03/13/2024 01:32:14 AM COMPARISON: 04/09/2022 CLINICAL HISTORY: Questionable sepsis - evaluate for abnormality FINDINGS: LUNGS AND PLEURA: Shallow inspiration. Peribronchial thickening with perihilar infiltrate suggesting airways disease. No pleural effusion. No pneumothorax. HEART AND MEDIASTINUM: Mild cardiac enlargement. Mediastinal contours appear intact. BONES AND SOFT TISSUES: No acute osseous abnormality. IMPRESSION: 1. Peribronchial thickening with perihilar infiltrate, suggestive of airways disease. Electronically signed by: Elsie Gravely MD 03/13/2024 01:35 AM EST RP Workstation: [id number]    EKG: Independently reviewed.  Sinus tachycardia.  Unspecific ST-T changes.  Assessment/Plan Principal Problem:   Sepsis (HCC) Active Problems:   Parkinsonism (HCC)   REM sleep behavior disorder   Elevated bilirubin   Dementia (HCC)   Acute renal failure   Anemia   CAP  (community acquired pneumonia)    Sepsis -     at presentation patient was febrile with confusion and scan showing pneumonia with patient being tachycardic with elevated lactic acid consistent with sepsis physiology.  Started on fluid bolus per sepsis protocol.  On empiric antibiotics for community-acquired pneumonia.  Follow cultures continue hydration follow lactic acid level. Community-acquired pneumonia see #1. Acute encephalopathy secondary to sepsis.  Per patient's wife patient's mentation is back to baseline.  CT head unremarkable. Acute renal failure with no old labs to compare.  Follow metabolic panel after hydration.  UA shows some protein and RBCs.  Will need repeat UA and follow-up. History of Parkinson's disease on Sinemet  discussed with pharmacy about dosing. Dementia on Aricept  and Namenda . History of REM sleep disorder on melatonin. Anemia with no old labs to compare.  Follow CBC.  Check anemia panel. Elevated total bilirubin level check LFTs.  Abdomen appears benign.  CT does not show anything acute. History of lumbar stenosis denies any back pain at this time.  No difficulty moving extremities. Prior history of hypertension and hyperlipidemia no longer on  any medications for over a year.   Since patient has sepsis with pneumonia will need close monitoring further workup and more than 2 midnight stay.  DVT prophylaxis: Lovenox . Code Status: Full code. Family Communication: Patient's wife. Disposition Plan: Monitored bed. Consults called: None. Admission status: Inpatient.         [1] No Known Allergies  "

## 2024-03-13 NOTE — ED Triage Notes (Signed)
 Pt arrives w/ [facility] from home w/ c/o more confusion than usual. Hx dementia. Family also states fevers & more urinary frequency than normal. Also c/o strong smelling urine. Family also reports change in meds recently as well.  22G L hand.  500mL LR given en route.

## 2024-03-13 NOTE — Sepsis Progress Note (Signed)
 Elink following for sepsis protocol.

## 2024-03-13 NOTE — ED Notes (Signed)
 Pt changed into hospital gown, brief and condom cath placed on pt. Pt repositioned in bed.

## 2024-03-13 NOTE — ED Provider Notes (Signed)
 " [place] EMERGENCY DEPARTMENT AT [facility] Provider Note   CSN: [id number] Arrival date & time: 03/13/24  0050     Patient presents with: Fever and Altered Mental Status   Tyler Dickerson is a 59 y.o. male.   The history is provided by the EMS personnel, the spouse and medical records.  Fever Altered Mental Status Associated symptoms: fever   Tyler Dickerson is a 59 y.o. male who presents to the Emergency Department complaining of fever and altered mental status.  He presents to the emergency department by EMS from home for evaluation of worsening confusion.  He does have a history of Lewy body dementia and has some confusion at baseline but has experienced frequently worsening of his mental status over the last 24 to 48 hours.  He is also having increased urinary frequency and incontinence with strong smelling urine.  He had a low-grade tactile temperature yesterday, felt much warmer today.      Prior to Admission medications  Medication Sig Start Date End Date Taking? Authorizing Provider  carbidopa -levodopa  (SINEMET  IR) 25-100 MG tablet 2 at 8am/2 at noon/1 at 4pm Patient taking differently: Take 1-2 tablets by mouth in the morning, at noon, and at bedtime. Take 2 tablets at 8 am, Take 2 tablets at noon & Take 1 tablet at 4pm 02/24/24  Yes Tat, Asberry RAMAN, DO  donepezil  (ARICEPT ) 10 MG tablet Take half tablet (5 mg) daily for 2 weeks, then increase to the full tablet at 10 mg daily Patient taking differently: Take 10 mg by mouth every evening. 05/10/23  Yes Wertman, Sara E, PA-C  melatonin 5 MG TABS Take 5 mg by mouth at bedtime.   Yes [provider]  memantine  (NAMENDA ) 10 MG tablet Take 1 tablet (10 mg total) by mouth 2 (two) times daily. 02/24/24  Yes Tat, Asberry RAMAN, DO  ibuprofen  (ADVIL ,MOTRIN ) 600 MG tablet Take 1 tablet (600 mg total) by mouth every 8 (eight) hours as needed for pain. Take with meals Patient not taking: Reported on 03/13/2024 04/23/12    Moreno-Coll, Adlih, MD  mirtazapine  (REMERON ) 15 MG tablet Take 1 tablet (15 mg total) by mouth at bedtime. Patient not taking: No sig reported 11/06/22   Wertman, Sara E, PA-C    Allergies: Patient has no known allergies.    Review of Systems  Constitutional:  Positive for fever.  All other systems reviewed and are negative.   Updated Vital Signs BP 118/87   Pulse 90   Temp 99.2 F (37.3 C) (Axillary)   Resp (!) 21   SpO2 94%   Physical Exam Vitals and nursing note reviewed.  Constitutional:      Appearance: He is well-developed.  HENT:     Head: Normocephalic and atraumatic.  Cardiovascular:     Rate and Rhythm: Regular rhythm. Tachycardia present.     Heart sounds: No murmur heard. Pulmonary:     Effort: Pulmonary effort is normal. No respiratory distress.     Breath sounds: Normal breath sounds.  Abdominal:     Palpations: Abdomen is soft.     Tenderness: There is no abdominal tenderness. There is no guarding or rebound.  Musculoskeletal:        General: No swelling or tenderness.  Skin:    General: Skin is warm and dry.  Neurological:     Mental Status: He is alert and oriented to person, place, and time.  Psychiatric:        Behavior: Behavior  normal.     (all labs ordered are listed, but only abnormal results are displayed) Labs Reviewed  COMPREHENSIVE METABOLIC PANEL WITH GFR - Abnormal; Notable for the following components:      Result Value   Glucose, Bld 122 (*)    Creatinine, Ser 1.34 (*)    AST 47 (*)    Total Bilirubin 1.7 (*)    All other components within normal limits  CBC WITH DIFFERENTIAL/PLATELET - Abnormal; Notable for the following components:   WBC 15.7 (*)    Hemoglobin 12.7 (*)    HCT 38.3 (*)    Neutro Abs 13.6 (*)    Abs Immature Granulocytes 0.19 (*)    All other components within normal limits  PROTIME-INR - Abnormal; Notable for the following components:   Prothrombin Time 15.6 (*)    All other components within normal  limits  URINALYSIS, W/ REFLEX TO CULTURE (INFECTION SUSPECTED) - Abnormal; Notable for the following components:   Glucose, UA 50 (*)    Hgb urine dipstick SMALL (*)    Ketones, ur 5 (*)    Protein, ur 100 (*)    Bacteria, UA RARE (*)    All other components within normal limits  CBC WITH DIFFERENTIAL/PLATELET - Abnormal; Notable for the following components:   WBC 18.7 (*)    RBC 3.73 (*)    Hemoglobin 10.8 (*)    HCT 32.7 (*)    Neutro Abs 14.7 (*)    Monocytes Absolute 1.4 (*)    Abs Immature Granulocytes 0.11 (*)    All other components within normal limits  I-STAT CG4 LACTIC ACID, ED - Abnormal; Notable for the following components:   Lactic Acid, Venous 2.4 (*)    All other components within normal limits  I-STAT CG4 LACTIC ACID, ED - Abnormal; Notable for the following components:   Lactic Acid, Venous 4.6 (*)    All other components within normal limits  RESP PANEL BY RT-PCR (RSV, FLU A&B, COVID)  RVPGX2  CULTURE, BLOOD (ROUTINE X 2)  CULTURE, BLOOD (ROUTINE X 2)  HIV ANTIBODY (ROUTINE TESTING W REFLEX)  CBC  BASIC METABOLIC PANEL WITH GFR  HEPATIC FUNCTION PANEL  TSH  LACTIC ACID, PLASMA  LACTIC ACID, PLASMA    EKG: None  Radiology: CT Head Wo Contrast Result Date: 03/13/2024 EXAM: CT HEAD WITHOUT 03/13/2024 03:29:31 AM TECHNIQUE: CT of the head was performed without the administration of intravenous contrast. Automated exposure control, iterative reconstruction, and/or weight based adjustment of the mA/kV was utilized to reduce the radiation dose to as low as reasonably achievable. COMPARISON: 12/31/2020 CLINICAL HISTORY: Mental status change, unknown cause FINDINGS: BRAIN AND VENTRICLES: No acute intracranial hemorrhage. No mass effect or midline shift. No extra-axial fluid collection. No evidence of acute infarct. No hydrocephalus. ORBITS: No acute abnormality. SINUSES AND MASTOIDS: No acute abnormality. SOFT TISSUES AND SKULL: No acute skull fracture. No acute soft  tissue abnormality. IMPRESSION: 1. No acute intracranial abnormality. Electronically signed by: Franky Crease MD 03/13/2024 03:38 AM EST RP Workstation: [id number]   CT ABDOMEN PELVIS W CONTRAST Result Date: 03/13/2024 EXAM: CT ABDOMEN AND PELVIS WITH CONTRAST 03/13/2024 03:29:31 AM TECHNIQUE: CT of the abdomen and pelvis was performed with the administration of 100 mL of iohexol  (OMNIPAQUE ) 300 MG/ML solution. Multiplanar reformatted images are provided for review. Automated exposure control, iterative reconstruction, and/or weight-based adjustment of the mA/kV was utilized to reduce the radiation dose to as low as reasonably achievable. COMPARISON: 03/17/2019 CLINICAL HISTORY: Sepsis. FINDINGS: LOWER CHEST:  Consolidation in the left lower lobe compatible with pneumonia. LIVER: The liver is unremarkable. GALLBLADDER AND BILE DUCTS: Gallbladder is unremarkable. No biliary ductal dilatation. SPLEEN: No acute abnormality. PANCREAS: No acute abnormality. ADRENAL GLANDS: No acute abnormality. KIDNEYS, URETERS AND BLADDER: Bilateral simple appearing renal cysts, the largest 5.7 cm in the right upper pole. Per consensus, no follow-up is needed for simple Bosniak type 1 and 2 renal cysts, unless the patient has a malignancy history or risk factors. No stones in the kidneys or ureters. No hydronephrosis. No perinephric or periureteral stranding. Urinary bladder is unremarkable. GI AND BOWEL: Stomach demonstrates no acute abnormality. Scattered colonic diverticulosis. Normal appendix. There is no bowel obstruction. PERITONEUM AND RETROPERITONEUM: No ascites. No free air. VASCULATURE: Aorta is normal in caliber. Aortoiliac atherosclerosis. Stenosis in the left common iliac artery, approximately 50%. LYMPH NODES: No lymphadenopathy. REPRODUCTIVE ORGANS: No acute abnormality. BONES AND SOFT TISSUES: No acute osseous abnormality. No focal soft tissue abnormality. IMPRESSION: 1. Left lower lobe pneumonia. 2. Aortoiliac  atherosclerosis with approximately 50% stenosis in the left common iliac artery. Electronically signed by: Franky Crease MD 03/13/2024 03:37 AM EST RP Workstation: [id number]   DG Chest Port 1 View Result Date: 03/13/2024 EXAM: 1 VIEW(S) XRAY OF THE CHEST 03/13/2024 01:32:14 AM COMPARISON: 04/09/2022 CLINICAL HISTORY: Questionable sepsis - evaluate for abnormality FINDINGS: LUNGS AND PLEURA: Shallow inspiration. Peribronchial thickening with perihilar infiltrate suggesting airways disease. No pleural effusion. No pneumothorax. HEART AND MEDIASTINUM: Mild cardiac enlargement. Mediastinal contours appear intact. BONES AND SOFT TISSUES: No acute osseous abnormality. IMPRESSION: 1. Peribronchial thickening with perihilar infiltrate, suggestive of airways disease. Electronically signed by: Elsie Gravely MD 03/13/2024 01:35 AM EST RP Workstation: [id number]     Procedures  CRITICAL CARE Performed by: Almarie Burner   Total critical care time: 40 minutes  Critical care time was exclusive of separately billable procedures and treating other patients.  Critical care was necessary to treat or prevent imminent or life-threatening deterioration.  Critical care was time spent personally by me on the following activities: development of treatment plan with patient and/or surrogate as well as nursing, discussions with consultants, evaluation of patient's response to treatment, examination of patient, obtaining history from patient or surrogate, ordering and performing treatments and interventions, ordering and review of laboratory studies, ordering and review of radiographic studies, pulse oximetry and re-evaluation of patient's condition.  Medications Ordered in the ED  donepezil  (ARICEPT ) tablet 10 mg (has no administration in time range)  melatonin tablet 5 mg (has no administration in time range)  memantine  (NAMENDA ) tablet 10 mg (has no administration in time range)  enoxaparin  (LOVENOX ) injection 40 mg  (has no administration in time range)  acetaminophen  (TYLENOL ) tablet 650 mg (has no administration in time range)    Or  acetaminophen  (TYLENOL ) suppository 650 mg (has no administration in time range)  0.9 %  sodium chloride  infusion ( Intravenous New Bag/Given 03/13/24 0644)  cefTRIAXone  (ROCEPHIN ) 1 g in sodium chloride  0.9 % 100 mL IVPB (has no administration in time range)  azithromycin  (ZITHROMAX ) 500 mg in sodium chloride  0.9 % 250 mL IVPB (has no administration in time range)  carbidopa -levodopa  (SINEMET  IR) 25-100 MG per tablet immediate release 2 tablet (has no administration in time range)    And  carbidopa -levodopa  (SINEMET  IR) 25-100 MG per tablet immediate release 1 tablet (has no administration in time range)  lactated ringers  bolus 1,000 mL (0 mLs Intravenous Stopped 03/13/24 0203)  acetaminophen  (TYLENOL ) suppository 650 mg (650 mg Rectal Given 03/13/24 0200)  cefTRIAXone  (ROCEPHIN ) 1 g in sodium chloride  0.9 % 100 mL IVPB (0 g Intravenous Stopped 03/13/24 0224)  azithromycin  (ZITHROMAX ) 500 mg in sodium chloride  0.9 % 250 mL IVPB (0 mg Intravenous Stopped 03/13/24 0255)  iohexol  (OMNIPAQUE ) 300 MG/ML solution 100 mL (100 mLs Intravenous Contrast Given 03/13/24 0321)  sodium chloride  0.9 % bolus 1,000 mL (0 mLs Intravenous Stopped 03/13/24 0644)  sodium chloride  0.9 % bolus 500 mL (0 mLs Intravenous Stopped 03/13/24 0559)                                    Medical Decision Making Amount and/or Complexity of Data Reviewed Labs: ordered. Radiology: ordered.  Risk OTC drugs. Prescription drug management. Decision regarding hospitalization.   Patient with history of dementia here for evaluation of progressive altered mental status over the last several weeks with fever and worsening mental status over the last 24 to 48 hours.  He is ill-appearing on ED arrival with tachycardia, tachypnea and confusion.  He was started on IV fluids, antipyretic.  Chest x-ray concerning for possible  pneumonia and he was started on antibiotics for community-acquired pneumonia.  CBC with leukocytosis.  Initial lactic acid is mildly elevated.  Patient has maintained normal blood pressures throughout his ED stay.  Repeat lactic acid increased to 4.6.  Clinically patient does appear improved compared to presentation.  Will add on additional 1500 cc for full 30 cc per Kilogram fluid bolus.  Patient did initially have some abdominal tenderness on examination and a CT abdomen pelvis was obtained, this is negative for acute intra-abdominal pathology but does demonstrate a left lower lobe pneumonia.  Urinalysis is not consistent with UTI.  Current clinical picture is not consistent with meningitis.  Discussed with patient, wife at the bedside findings of studies.  Discussed recommendation for admission for antibiotics for community-acquired pneumonia with sepsis.  Hospitalist consulted for admission for ongoing care.     Final diagnoses:  Community acquired pneumonia of left lower lobe of lung  Sepsis with encephalopathy without septic shock, due to unspecified organism [facility])    ED Discharge Orders     None          Griselda Norris, MD 03/13/24 [phone]  "

## 2024-03-13 NOTE — Plan of Care (Signed)
 Courtesy visit: No billing-  Patient is seen and examined today morning in the emergency department room 1.  Patient is admitted to hospitalist service for sepsis secondary to pneumonia, AKI.  He is lying in bed, able to answer me appropriately though slowly.  Patient is tachycardic this morning during my exam.  Denies any other complaints.  Plan: Patient will be continued on current IV antibiotics Rocephin  and azithromycin  for community-acquired pneumonia.  Continue to monitor vitals closely.  Follow cultures.  Continue gentle IV fluids.  Monitor renal function, electrolytes.  Patient will be continued on home medications.  Please see H&P for further management details. I will discuss with patient's wife regarding his CODE STATUS as it is ordered DNR comfort.  Further management as per clinical course.

## 2024-03-14 LAB — BASIC METABOLIC PANEL WITH GFR
Anion gap: 8 (ref 5–15)
BUN: 13 mg/dL (ref 6–20)
CO2: 20 mmol/L — ABNORMAL LOW (ref 22–32)
Calcium: 8.7 mg/dL — ABNORMAL LOW (ref 8.9–10.3)
Chloride: 113 mmol/L — ABNORMAL HIGH (ref 98–111)
Creatinine, Ser: 1.07 mg/dL (ref 0.61–1.24)
GFR, Estimated: >60 mL/min
Glucose, Bld: 89 mg/dL (ref 70–99)
Potassium: 4 mmol/L (ref 3.5–5.1)
Sodium: 140 mmol/L (ref 135–145)

## 2024-03-14 MED ORDER — AZITHROMYCIN 500 MG PO TABS
500.0000 mg | ORAL_TABLET | Freq: Every day | ORAL | Status: DC
  Administered 2024-03-15 – 2024-03-16 (×2): 500 mg via ORAL
  Filled 2024-03-14 (×2): qty 1

## 2024-03-14 MED ORDER — ENOXAPARIN SODIUM 40 MG/0.4ML IJ SOSY
40.0000 mg | PREFILLED_SYRINGE | INTRAMUSCULAR | Status: DC
  Administered 2024-03-14 – 2024-03-15 (×2): 40 mg via SUBCUTANEOUS
  Filled 2024-03-14 (×2): qty 0.4

## 2024-03-14 NOTE — Progress Notes (Signed)
 PHARMACIST - PHYSICIAN COMMUNICATION DR:   Jadine CONCERNING: Antibiotic IV to Oral Route Change Policy  RECOMMENDATION: This patient is receiving Azithromycin  by the intravenous route.  Based on criteria approved by the Pharmacy and Therapeutics Committee, the antibiotic(s) is/are being converted to the equivalent oral dose form(s).   DESCRIPTION: These criteria include: Patient being treated for a respiratory tract infection, urinary tract infection, cellulitis or clostridium difficile associated diarrhea if on metronidazole The patient is not neutropenic and does not exhibit a GI malabsorption state The patient is eating (either orally or via tube) and/or has been taking other orally administered medications for a least 24 hours The patient is improving clinically and has a Tmax < 100.5  If you have questions about this conversion, please contact the Pharmacy Department  []   [phone] )  [facility] []   [phone] )  [facility] []   [phone] )  [facility] []   [phone] )  [facility] [x]   [phone] )  [facility]    Thank you for allowing pharmacy to be a part of this patients care.  Almarie Lunger, PharmD, BCPS, BCIDP Infectious Diseases Clinical Pharmacist 03/14/2024 12:53 PM   **Pharmacist phone directory can now be found on [email or web address] ([email or web address]).  Listed under [facility] Pharmacy.

## 2024-03-14 NOTE — Evaluation (Signed)
 Clinical/Bedside Swallow Evaluation Patient Details  Name: Tyler Dickerson MRN: [id number] Date of Birth: [birth year]  Today's Date: 03/14/2024 Time: SLP Start Time (ACUTE ONLY): 1217 SLP Stop Time (ACUTE ONLY): 1228 SLP Time Calculation (min) (ACUTE ONLY): 11 min  Past Medical History:  Past Medical History:  Diagnosis Date   Atypical chest pain 11/03/2016   Dyslipidemia 11/03/2016   Family history of abdominal aortic aneurysm (AAA) 11/03/2016   Family history of peripheral arterial disease 11/03/2016   History of COVID-19 10/2020   Hypercholesteremia    Mild   Hyperplastic colon polyp    Hypertension    Mild cognitive impairment, concerns for Lewy body disease 02/23/2022   Parkinsonism (HCC)    REM sleep behavior disorder    Tremor    Past Surgical History:  Past Surgical History:  Procedure Laterality Date   COLONOSCOPY  2015   REPLACEMENT TOTAL KNEE Left 1996   HPI:  Tyler Dickerson is a 59 yo presenting 1/5 with sepsis from LLL PNA. PMH includes: Parkinson's disease, dementia    Assessment / Plan / Recommendation  Clinical Impression  Pt's oropharyngeal swallow appears to be within functional limits with no overt signs of aspiration noted. Pt does have an oral/lingual tremor but this does not appear to have overt impact on his functional abilities. He and his family deny any h/o dysphagia or PNA. Recommend continuing with regular solids and thin liquids with brief f/u while inpatient given concern for PNA at present with h/o PD and cognitive changes.   SLP Visit Diagnosis: Dysphagia, unspecified (R13.10)      Swallow Evaluation Recommendations Recommendations: PO diet PO Diet Recommendation: Regular;Thin liquids (Level 0) Liquid Administration via: Cup;Straw Medication Administration: Whole meds with liquid Supervision: Patient able to self-feed;Set-up assistance for safety Swallowing strategies  : Slow rate;Small bites/sips Postural changes: Position pt fully upright  for meals Oral care recommendations: Oral care BID (2x/day)   Assistance Recommended at Discharge    Functional Status Assessment Patient has had a recent decline in their functional status and demonstrates the ability to make significant improvements in function in a reasonable and predictable amount of time.  Frequency and Duration min 1 x/week  1 week       Prognosis        Swallow Study   General HPI: Tyler Dickerson is a 59 yo presenting 1/5 with sepsis from LLL PNA. PMH includes: Parkinson's disease, dementia Type of Study: Bedside Swallow Evaluation Previous Swallow Assessment: none in chart Diet Prior to this Study: Regular;Thin liquids (Level 0) Temperature Spikes Noted: No Respiratory Status: Room air History of Recent Intubation: No Behavior/Cognition: Alert;Cooperative;Pleasant mood Oral Cavity Assessment: Within Functional Limits Oral Care Completed by SLP: No Oral Cavity - Dentition: Adequate natural dentition Vision: Functional for self-feeding Self-Feeding Abilities: Able to feed self Patient Positioning: Upright in chair Baseline Vocal Quality: Low vocal intensity Volitional Cough: Weak Volitional Swallow: Able to elicit    Oral/Motor/Sensory Function Overall Oral Motor/Sensory Function: Other (comment) (oral/lingual tremor)   Ice Chips Ice chips: Not tested   Thin Liquid Thin Liquid: Within functional limits Presentation: Self Fed;Straw    Nectar Thick Nectar Thick Liquid: Not tested   Honey Thick Honey Thick Liquid: Not tested   Puree Puree: Not tested   Solid     Solid: Within functional limits Presentation: Self Fed      Leita SAILOR., M.A. CCC-SLP Acute Rehabilitation Services Office: [phone]  Secure chat preferred  03/14/2024,12:32 PM

## 2024-03-14 NOTE — Hospital Course (Addendum)
 59 year old man PMH Parkinson's disease, Parkinson's dementia, REM sleep disorder, lives with wife, presented with fever, chills, increasing confusion.  Febrile in the ED with leukocytosis and CT showing left lower lobe pneumonia.  Admitted for sepsis secondary to left lower lobe pneumonia.  Consultants None   Procedures/Events 1/5 admit for sepsis, LLL pneumonia

## 2024-03-14 NOTE — Progress Notes (Signed)
 " Progress Note   Patient: Tyler Dickerson FMW:[id number] DOB: 21-Mar-1965 DOA: 03/13/2024     1 DOS: the patient was seen and examined on 03/14/2024   Brief hospital course: 59 year old man PMH Parkinson's disease, Parkinson's dementia, REM sleep disorder, lives with wife, presented with fever, chills, increasing confusion.  Febrile in the ED with leukocytosis and CT showing left lower lobe pneumonia.  Admitted for sepsis secondary to left lower lobe pneumonia.  Consultants None   Procedures/Events 1/5 admit for sepsis, LLL pneumonia  Assessment and Plan: Sepsis Left lower lobe pneumonia Acute encephalopathy On admission lactic acid 2.4, 4.6, WBC 18.7.  Chest x-ray no acute disease.  CT head negative.  CT abdomen pelvis showed left lower lobe pneumonia.  Blood cultures no growth today. Afebrile greater than 24 hours.  No hypoxia.  Appears, comfortable. Continue empiric antibiotics.  Speech therapy evaluation although no history to suggest aspiration  Abnormal EKG Left bundle branch block New compared to previous study 2018.  This can be followed up in the outpatient setting.  No signs or symptoms of ACS.  Reviewed with Dr. Pietro.  Renal insufficiency Admission creatinine 1.34, on repeat 1.07.  At this time AKI ruled out.  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  On last office visit memantine  was increased and donepezil  was continued.  REM behavior disorder Continue melatonin, follow-up with neurology  Lower back pain with hyperreflexia and cross adductor reflexes Followed by neurology with previous imaging of the spine  Depression Situational grief Son passed away fairly recently  Aortic atherosclerosis Approximately 50% stenosis left common iliac artery Follow-up with vascular surgery as an outpatient     Subjective:   Family reported some facial twitching on the left earlier today via RN  Patient denies complaints, no chest pain, no pain, feels fine  Wife at bedside reports he had some facial twitching predominantly on the whole left side for about 10 minutes this morning.  No history of seizure disorder.  Physical Exam: Vitals:   03/13/24 1811 03/14/24 0122 03/14/24 0549 03/14/24 0845  BP: 118/80 (!) 160/116 (!) 150/92 (!) 149/89  Pulse: 85 (!) 102 82 72  Resp: 20 18 17    Temp: 99 F (37.2 C) 98.7 F (37.1 C) (!) 97.3 F (36.3 C) 98.6 F (37 C)  TempSrc: Oral Oral Oral Oral  SpO2: 98% 100% 99% 97%  Weight:      Height:       Physical Exam Vitals reviewed.  Constitutional:      General: He is not in acute distress.    Appearance: He is not ill-appearing or toxic-appearing.     Comments: Eating breakfast  Cardiovascular:     Rate and Rhythm: Normal rate and regular rhythm.     Heart sounds: No murmur heard.    Comments: Telemetry sinus rhythm Pulmonary:     Effort: Pulmonary effort is normal. No respiratory distress.     Breath sounds: No wheezing, rhonchi or rales.  Musculoskeletal:     Right lower leg: No edema.     Left lower leg: No edema.  Neurological:     General: No focal deficit present.     Mental Status: He is alert.     Cranial Nerves: No cranial nerve deficit.     Motor: No weakness.     Coordination: Coordination normal.     Comments: Cranial nerves appear intact.  When he closes his eyes he does have some twitching predominantly of the left eyelid, some of the left face, this resolves when he opens his eyes.  Psychiatric:        Mood and Affect: Mood normal.        Behavior: Behavior normal.     Data Reviewed: BMP unremarkable Lactic acid returned to normal yesterday No repeat CBC today  Family Communication: Wife and mother at bedside  Disposition: Status is: Inpatient Remains inpatient appropriate because: sepsis     Time spent: 35  minutes  Author: Toribio Door, MD 03/14/2024 9:30 AM  For on call review [email or web address].    "

## 2024-03-15 DIAGNOSIS — A419 Sepsis, unspecified organism: Secondary | ICD-10-CM

## 2024-03-15 MED ORDER — MELATONIN 5 MG PO TABS
10.0000 mg | ORAL_TABLET | Freq: Every day | ORAL | Status: DC
  Administered 2024-03-15: 10 mg via ORAL
  Filled 2024-03-15: qty 2

## 2024-03-15 NOTE — Progress Notes (Signed)
 Mobility Specialist - Progress Note   03/15/24 1455  Mobility  Activity Ambulated with assistance  Level of Assistance Standby assist, set-up cues, supervision of patient - no hands on  Assistive Device Front wheel walker  Distance Ambulated (ft) 500 ft  Range of Motion/Exercises Active  Activity Response Tolerated well  Mobility visit 1 Mobility  Mobility Specialist Start Time (ACUTE ONLY) 1445  Mobility Specialist Stop Time (ACUTE ONLY) 1455  Mobility Specialist Time Calculation (min) (ACUTE ONLY) 10 min   Pt was found on recliner chair and agreeable to mobilize. No complaints. At EOS returned to recliner chair with all needs met. Call bell in reach. Chair alarm on.   Erminio Leos,  Mobility Specialist Can be reached via Secure Chat

## 2024-03-15 NOTE — TOC Initial Note (Signed)
 Transition of Care [facility]) - Initial/Assessment Note    Patient Details  Name: Tyler Dickerson MRN: [id number] Date of Birth: 09-28-1965  Transition of Care [facility]) CM/SW Contact:    Bascom Service, RN Phone Number: 03/15/2024, 2:51 PM  Clinical Narrative:  dementia. Spoke to Angela(spouse) d/c plan home. Medicaid pending-has PCP,pharmacy.Has own transport home.                Expected Discharge Plan: Home/Self Care Barriers to Discharge: Continued Medical Work up   Patient Goals and CMS Choice Patient states their goals for this hospitalization and ongoing recovery are:: Home CMS Medicare.gov Compare Post Acute Care list provided to:: Patient Represenative (must comment) (Angela(spouse)) Choice offered to / list presented to : Spouse [place] ownership interest in [facility].provided to:: Spouse    Expected Discharge Plan and Services   Discharge Planning Services: CM Consult   Living arrangements for the past 2 months: Single Family Home                                      Prior Living Arrangements/Services Living arrangements for the past 2 months: Single Family Home Lives with:: Spouse                   Activities of Daily Living   ADL Screening (condition at time of admission) Independently performs ADLs?: No Does the patient have a NEW difficulty with bathing/dressing/toileting/self-feeding that is expected to last >3 days?: No Does the patient have a NEW difficulty with getting in/out of bed, walking, or climbing stairs that is expected to last >3 days?: No Does the patient have a NEW difficulty with communication that is expected to last >3 days?: No Is the patient deaf or have difficulty hearing?: No Does the patient have difficulty seeing, even when wearing glasses/contacts?: No Does the patient have difficulty concentrating, remembering, or making decisions?: Yes  Permission Sought/Granted                  Emotional  Assessment              Admission diagnosis:  Sepsis (HCC) [A41.9] Community acquired pneumonia of left lower lobe of lung [J18.9] Sepsis with encephalopathy without septic shock, due to unspecified organism (HCC) [A41.9, R65.20, G93.41] Patient Active Problem List   Diagnosis Date Noted   Sepsis (HCC) 03/13/2024   Elevated bilirubin 03/13/2024   Dementia (HCC) 03/13/2024   Acute renal failure 03/13/2024   Anemia 03/13/2024   CAP (community acquired pneumonia) 03/13/2024   Acute encephalopathy 03/13/2024   Hyperplastic colon polyp 02/23/2022   Hypertension 02/23/2022   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 [place] [address] Phone: [phone] Fax: [phone]     Social Drivers of Health (SDOH) Social History: SDOH Screenings   Food Insecurity: Patient Unable To Answer (03/13/2024)  Housing: Unknown (03/13/2024)  Transportation Needs: Patient Unable To Answer (03/13/2024)  Utilities: Patient Unable To Answer (03/13/2024)  Tobacco Use: Medium Risk (03/13/2024)   SDOH  Interventions:     Readmission Risk Interventions     No data to display

## 2024-03-15 NOTE — Plan of Care (Signed)

## 2024-03-15 NOTE — Progress Notes (Signed)
 Speech Language Pathology Treatment: Dysphagia  Patient Details Name: Tyler Dickerson MRN: [id number] DOB: Jun 09, 1965 Today's Date: 03/15/2024 Time: 8649-8641 SLP Time Calculation (min) (ACUTE ONLY): 8 min  Assessment / Plan / Recommendation Clinical Impression  Rec: continue with regular solids, thin liquids. SLP to s/o at this time  Patient seen by SLP for skilled treatment focused on dysphagia goals. He had lunch tray but had not eaten any of it and asked SLP to take it out of room. He denied any difficulty with swallowing but no family present to confirm or deny this. SLP assessed his swallow as he drank sequential sips of thin liquids via straw. Swallow initiation appeared timely and no overt s/s aspiration. RN contacted via secure message chat and she denied observing any swallowing difficulties and reported that he did eat his breakfast meal tray.    HPI HPI: Mr. Whitwell is a 59 yo presenting 1/5 with sepsis from LLL PNA. PMH includes: Parkinson's disease, dementia      SLP Plan  Discharge SLP treatment due to (comment);All goals met        Swallow Evaluation Recommendations    N/A     Recommendations  Diet recommendations: Regular;Thin liquid Liquids provided via: Cup;Straw Medication Administration: Other (Comment) (as tolerated) Supervision: Patient able to self feed Compensations: Slow rate;Small sips/bites;Minimize environmental distractions Postural Changes and/or Swallow Maneuvers: Seated upright 90 degrees                  Oral care BID     Dysphagia, unspecified (R13.10)     Discharge SLP treatment due to (comment);All goals met     Norleen IVAR Blase, MA, CCC-SLP Speech Therapy   03/15/2024, 2:49 PM

## 2024-03-15 NOTE — Progress Notes (Signed)
 " PROGRESS NOTE    Tyler Dickerson  FMW:[id number] DOB: Oct 01, 1965 DOA: 03/13/2024 PCP: Regino Slater, MD   Brief Narrative:  59 year old man PMH Parkinson's disease, Parkinson's dementia, REM sleep disorder presented with fever, chills and increasing confusion.  He was admitted for sepsis secondary to left lower lobe pneumonia and started on IV antibiotics.  Assessment & Plan:   Severe sepsis: Present on admission Left lower lobe current acquired bacterial pneumonia, bacteria unspecified Acute metabolic encephalopathy Lactic acidosis: Resolved - Hemodynamically improving.  Cultures negative so far.  Currently on room air.  Continue Rocephin  and Zithromax .  Mental status improving. - Diet as per SLP recommendations  Leukocytosis - No labs today.  Repeat a.m. labs  Acute metabolic acidosis - Mild.  Monitor  Elevated AST -Questionable cause.  Repeat a.m. labs  Anemia of chronic disease - From chronic illnesses.  Hemoglobin stable.  Monitor intermittently  Abnormal EKG Left bundle branch block -New compared to previous study 2018. This can be followed up in the outpatient setting. No signs or symptoms of ACS.  Prior hospitalist reviewed with Dr. Pietro.   Renal insufficiency - Improved.  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  Continue memantine  and donepezil .  Outpatient follow-up with neurology  REM behavior disorder -Continue melatonin.  Outpatient follow-up with neurology.  Lower back pain with hyperreflexia and crossed adductor reflexes - Outpatient follow-up with neurology  Depression Situational. - Son passed away fairly recently.  Aortic atherosclerosis Approximately 50% stenosis of left common iliac artery -Outpatient follow-up with vascular surgery  DVT prophylaxis: Lovenox  Code Status: Full Family Communication: Wife at  bedside Disposition Plan: Status is: Inpatient Remains inpatient appropriate because: Of severity of illness    Consultants: None  Procedures: None  Antimicrobials:  Rocephin  and Zithromax   Subjective: Patient seen and examined at bedside.  Wife present at bedside.  Reports no facial twitching this morning.  No fever, seizures or vomiting reported.  Objective: Vitals:   03/14/24 0845 03/14/24 1307 03/14/24 2128 03/15/24 0421  BP: (!) 149/89 114/76 (!) 161/95 (!) 164/94  Pulse: 72 80 73 99  Resp:  16 19 17   Temp: 98.6 F (37 C) 98.4 F (36.9 C) 98.5 F (36.9 C) 97.9 F (36.6 C)  TempSrc: Oral Oral Oral Oral  SpO2: 97% 99% 100% 99%  Weight:      Height:        Intake/Output Summary (Last 24 hours) at 03/15/2024 1207 Last data filed at 03/15/2024 1100 Gross per 24 hour  Intake 600 ml  Output 1850 ml  Net -1250 ml   Filed Weights   03/13/24 1100  Weight: 85.3 kg    Examination:  General exam: Appears calm and comfortable. Respiratory system: Bilateral decreased breath sounds at bases with scattered crackles Cardiovascular system: S1 & S2 heard, Rate controlled Gastrointestinal system: Abdomen is nondistended, soft and nontender. Normal bowel sounds heard. Extremities: No cyanosis, clubbing, edema  Central nervous system: Awake, confused.  Slow to respond.  Poor historian.  No focal neurological deficits. Moving extremities Skin: No rashes, lesions or ulcers Psychiatry: Flat affect.  Not agitated   Data Reviewed: I have personally reviewed following labs and imaging studies  CBC: Recent Labs  Lab 03/13/24 0122 03/13/24 0639  WBC 15.7* 18.7*  NEUTROABS 13.6* 14.7*  HGB 12.7* 10.8*  HCT 38.3* 32.7*  MCV 87.4 87.7  PLT 161 161   Basic Metabolic Panel: Recent  Labs  Lab 03/13/24 0122 03/13/24 0639 03/14/24 0510  NA 136 138 140  K 3.9 4.0 4.0  CL 104 109 113*  CO2 22 20* 20*  GLUCOSE 122* 101* 89  BUN 16 13 13   CREATININE 1.34* 1.11 1.07  CALCIUM  9.1 8.3* 8.7*   GFR: Estimated Creatinine Clearance: 79.4 mL/min (by C-G formula based on SCr of 1.07 mg/dL). Liver Function Tests: Recent Labs  Lab 03/13/24 0122 03/13/24 0639  AST 47* 48*  ALT 6 12  ALKPHOS 62 50  BILITOT 1.7* 1.3*  PROT 6.7 5.6*  ALBUMIN 3.6 3.0*   No results for input(s): LIPASE, AMYLASE in the last 168 hours. No results for input(s): AMMONIA in the last 168 hours. Coagulation Profile: Recent Labs  Lab 03/13/24 0122  INR 1.2   Cardiac Enzymes: No results for input(s): CKTOTAL, CKMB, CKMBINDEX, TROPONINI in the last 168 hours. BNP (last 3 results) No results for input(s): PROBNP in the last 8760 hours. HbA1C: No results for input(s): HGBA1C in the last 72 hours. CBG: No results for input(s): GLUCAP in the last 168 hours. Lipid Profile: No results for input(s): CHOL, HDL, LDLCALC, TRIG, CHOLHDL, LDLDIRECT in the last 72 hours. Thyroid Function Tests: Recent Labs    03/13/24 0639  TSH 1.110   Anemia Panel: No results for input(s): VITAMINB12, FOLATE, FERRITIN, TIBC, IRON, RETICCTPCT in the last 72 hours. Sepsis Labs: Recent Labs  Lab 03/13/24 0404 03/13/24 0639 03/13/24 0942 03/13/24 1208  LATICACIDVEN 4.6* 1.1 0.8 1.6    Recent Results (from the past 240 hours)  Blood Culture (routine x 2)     Status: None (Preliminary result)   Collection Time: 03/13/24  1:22 AM   Specimen: BLOOD  Result Value Ref Range Status   Specimen Description   Final    BLOOD SITE NOT SPECIFIED Performed at [facility], [address], [place], [place] [address]    Special Requests   Final    BOTTLES DRAWN AEROBIC AND ANAEROBIC Blood Culture adequate volume Performed at [facility], [address], [place], [place] [address]    Culture   Final    NO GROWTH 2 DAYS Performed at [facility], [address] [place], [place] [address]    Report Status PENDING   Incomplete  Resp panel by RT-PCR (RSV, Flu A&B, Covid) Anterior Nasal Swab     Status: None   Collection Time: 03/13/24  2:01 AM   Specimen: Anterior Nasal Swab  Result Value Ref Range Status   SARS Coronavirus 2 by RT PCR NEGATIVE NEGATIVE Final    Comment: (NOTE) SARS-CoV-2 target nucleic acids are NOT DETECTED.  The SARS-CoV-2 RNA is generally detectable in upper respiratory specimens during the acute phase of infection. The lowest concentration of SARS-CoV-2 viral copies this assay can detect is 138 copies/mL. A negative result does not preclude SARS-Cov-2 infection and should not be used as the sole basis for treatment or other patient management decisions. A negative result may occur with  improper specimen collection/handling, submission of specimen other than nasopharyngeal swab, presence of viral mutation(s) within the areas targeted by this assay, and inadequate number of viral copies(<138 copies/mL). A negative result must be combined with clinical observations, patient history, and epidemiological information. The expected result is Negative.  Fact Sheet for Patients:  bloggercourse.com  Fact Sheet for Healthcare Providers:  seriousbroker.it  This test is no t yet approved or cleared by the [place]  FDA and  has been  authorized for detection and/or diagnosis of SARS-CoV-2 by FDA under an Emergency Use Authorization (EUA). This EUA will remain  in effect (meaning this test can be used) for the duration of the COVID-19 declaration under Section 564(b)(1) of the Act, 21 U.S.C.section 360bbb-3(b)(1), unless the authorization is terminated  or revoked sooner.       Influenza A by PCR NEGATIVE NEGATIVE Final   Influenza B by PCR NEGATIVE NEGATIVE Final    Comment: (NOTE) The Xpert Xpress SARS-CoV-2/FLU/RSV plus assay is intended as an aid in the diagnosis of influenza from Nasopharyngeal swab specimens and should  not be used as a sole basis for treatment. Nasal washings and aspirates are unacceptable for Xpert Xpress SARS-CoV-2/FLU/RSV testing.  Fact Sheet for Patients: bloggercourse.com  Fact Sheet for Healthcare Providers: seriousbroker.it  This test is not yet approved or cleared by the [place]  FDA and has been authorized for detection and/or diagnosis of SARS-CoV-2 by FDA under an Emergency Use Authorization (EUA). This EUA will remain in effect (meaning this test can be used) for the duration of the COVID-19 declaration under Section 564(b)(1) of the Act, 21 U.S.C. section 360bbb-3(b)(1), unless the authorization is terminated or revoked.     Resp Syncytial Virus by PCR NEGATIVE NEGATIVE Final    Comment: (NOTE) Fact Sheet for Patients: bloggercourse.com  Fact Sheet for Healthcare Providers: seriousbroker.it  This test is not yet approved or cleared by the [place]  FDA and has been authorized for detection and/or diagnosis of SARS-CoV-2 by FDA under an Emergency Use Authorization (EUA). This EUA will remain in effect (meaning this test can be used) for the duration of the COVID-19 declaration under Section 564(b)(1) of the Act, 21 U.S.C. section 360bbb-3(b)(1), unless the authorization is terminated or revoked.  Performed at [facility], [address], [place], [place] [address]   Blood Culture (routine x 2)     Status: None (Preliminary result)   Collection Time: 03/13/24  4:02 AM   Specimen: BLOOD  Result Value Ref Range Status   Specimen Description   Final    BLOOD BLOOD RIGHT FOREARM Performed at [facility], [address], [place], [place] [address]    Special Requests   Final    BOTTLES DRAWN AEROBIC ONLY Blood Culture results may not be optimal due to an inadequate volume of blood received in culture  bottles Performed at [facility], [address], [place], [place] [address]    Culture   Final    NO GROWTH 2 DAYS Performed at [facility], [address] [place], [place] [address]    Report Status PENDING  Incomplete         Radiology Studies: No results found.      Scheduled Meds:  azithromycin   500 mg Oral Daily   carbidopa -levodopa   2 tablet Oral BID   And   carbidopa -levodopa   1 tablet Oral Daily   donepezil   10 mg Oral QPM   enoxaparin  (LOVENOX ) injection  40 mg Subcutaneous Q24H   melatonin  5 mg Oral QHS   memantine   10 mg Oral BID   Continuous Infusions:  cefTRIAXone  (ROCEPHIN )  IV 1 g (03/14/24 2323)          Sophie Mao, MD [facility] 03/15/2024, 12:07 PM   "

## 2024-03-15 NOTE — Progress Notes (Signed)
 Mobility Specialist - Progress Note   03/15/24 1107  Mobility  Activity Ambulated with assistance  Level of Assistance Standby assist, set-up cues, supervision of patient - no hands on  Assistive Device Front wheel walker  Distance Ambulated (ft) 650 ft  Range of Motion/Exercises Active  Activity Response Tolerated well  Mobility visit 1 Mobility  Mobility Specialist Start Time (ACUTE ONLY) 1050  Mobility Specialist Stop Time (ACUTE ONLY) 1107  Mobility Specialist Time Calculation (min) (ACUTE ONLY) 17 min   Pt was found on recliner chair eager to mobilize. No complaints. At EOS returned to recliner chair with all needs met. Call bell in reach and chair alarm on. RN notified.   Erminio Leos,  Mobility Specialist Can be reached via Secure Chat

## 2024-03-16 LAB — CBC WITH DIFFERENTIAL/PLATELET
Abs Immature Granulocytes: 0.05 K/uL (ref 0.00–0.07)
Basophils Absolute: 0 K/uL (ref 0.0–0.1)
Basophils Relative: 0 %
Eosinophils Absolute: 0.1 K/uL (ref 0.0–0.5)
Eosinophils Relative: 1 %
HCT: 32.7 % — ABNORMAL LOW (ref 39.0–52.0)
Hemoglobin: 10.9 g/dL — ABNORMAL LOW (ref 13.0–17.0)
Immature Granulocytes: 1 %
Lymphocytes Relative: 33 %
Lymphs Abs: 2.1 K/uL (ref 0.7–4.0)
MCH: 28.5 pg (ref 26.0–34.0)
MCHC: 33.3 g/dL (ref 30.0–36.0)
MCV: 85.6 fL (ref 80.0–100.0)
Monocytes Absolute: 0.6 K/uL (ref 0.1–1.0)
Monocytes Relative: 10 %
Neutro Abs: 3.6 K/uL (ref 1.7–7.7)
Neutrophils Relative %: 55 %
Platelets: 238 K/uL (ref 150–400)
RBC: 3.82 MIL/uL — ABNORMAL LOW (ref 4.22–5.81)
RDW: 14.4 % (ref 11.5–15.5)
WBC: 6.4 K/uL (ref 4.0–10.5)
nRBC: 0 % (ref 0.0–0.2)

## 2024-03-16 LAB — COMPREHENSIVE METABOLIC PANEL WITH GFR
ALT: 20 U/L (ref 0–44)
AST: 29 U/L (ref 15–41)
Albumin: 3.1 g/dL — ABNORMAL LOW (ref 3.5–5.0)
Alkaline Phosphatase: 55 U/L (ref 38–126)
Anion gap: 10 (ref 5–15)
BUN: 7 mg/dL (ref 6–20)
CO2: 23 mmol/L (ref 22–32)
Calcium: 9.1 mg/dL (ref 8.9–10.3)
Chloride: 110 mmol/L (ref 98–111)
Creatinine, Ser: 1.02 mg/dL (ref 0.61–1.24)
GFR, Estimated: >60 mL/min
Glucose, Bld: 91 mg/dL (ref 70–99)
Potassium: 3.8 mmol/L (ref 3.5–5.1)
Sodium: 142 mmol/L (ref 135–145)
Total Bilirubin: 0.5 mg/dL (ref 0.0–1.2)
Total Protein: 6.2 g/dL — ABNORMAL LOW (ref 6.5–8.1)

## 2024-03-16 LAB — MAGNESIUM: Magnesium: 2.3 mg/dL (ref 1.7–2.4)

## 2024-03-16 MED ORDER — DONEPEZIL HCL 10 MG PO TABS: 10.0000 mg | ORAL_TABLET | Freq: Every evening | ORAL | Status: AC

## 2024-03-16 NOTE — Evaluation (Addendum)
 Physical Therapy Evaluation Patient Details Name: Tyler Dickerson MRN: [id number] DOB: [birth year] Today's Date: 03/16/2024  History of Present Illness  Pt is a 59 y.o. male brought to the ED due to fever, chills, and increased confusion.Scan showing pneumonia with patient being tachycardic with elevated lactic acid consistent with sepsis physiology. Past medical history significant for Parkinson disease, dementia, and REM sleep disorder.   Clinical Impression  Pt is a 59 y.o. male with above HPI resulting in the deficits listed below (see PT Problem List). Pt reports MOD I with mobility and reports wife assist with ADLs, iADLs as needed- pt with history of cognitive impairments- wife left beginning of session to go to work. Pt able to perform bed mobility and sit to stand transfers with MOD I. Pt ambulated total of ~16ft with CGA - supervision for safety. Pt performed stairs x3 with B UE support from railings and HHA with CGA. Pt will benefit from skilled PT to maximize functional mobility to increase independence. Recommend home with family support.                Equipment Recommendations None recommended by PT  Recommendations for Other Services       Functional Status Assessment Patient has had a recent decline in their functional status and demonstrates the ability to make significant improvements in function in a reasonable and predictable amount of time.     Precautions / Restrictions Precautions Precautions: Fall Restrictions Weight Bearing Restrictions Per Provider Order: No      Mobility  Bed Mobility Overal bed mobility: Modified Independent             General bed mobility comments: HOB lowered, increased time. No bed rails use.    Transfers Overall transfer level: Modified independent Equipment used: Rolling walker (2 wheels)               General transfer comment: demosntrates proper hand placement    Ambulation/Gait Ambulation/Gait assistance:  Contact guard assist Gait Distance (Feet): 160 Feet Assistive device: Rolling walker (2 wheels) Gait Pattern/deviations: Step-through pattern, Decreased stride length Gait velocity: decreased     General Gait Details: CGA-supervision with ambulation. Pt reports increased fatigue following mobility. Cues for close proximityto RW and upright posture. Pt reports L LE weaker compared to his R.  Stairs Stairs: Yes Stairs assistance: Contact guard assist Stair Management: One rail Left, Alternating pattern, Forwards Number of Stairs: 3 General stair comments: single rail and HHA ascending, use of B UE on railings to descend.  Wheelchair Mobility     Tilt Bed    Modified Rankin (Stroke Patients Only)       Balance Overall balance assessment: Mild deficits observed, not formally tested                                           Pertinent Vitals/Pain Pain Assessment Pain Assessment: No/denies pain    Home Living Family/patient expects to be discharged to:: Private residence Living Arrangements: Spouse/significant other Available Help at Discharge: Family   Home Access: Stairs to enter Entrance Stairs-Rails: Can reach both Entrance Stairs-Number of Steps: 3   Home Layout: One level   Additional Comments: pt reports wife assists and his mother comes by home sometimes too.    Prior Function Prior Level of Function : Needs assist  ADLs Comments: pt reports wife assist with bathing/dressing     Extremity/Trunk Assessment   Upper Extremity Assessment Upper Extremity Assessment: Overall WFL for tasks assessed    Lower Extremity Assessment Lower Extremity Assessment: Generalized weakness    Cervical / Trunk Assessment Cervical / Trunk Assessment: Normal  Communication   Communication Communication: No apparent difficulties    Cognition Arousal: Alert Behavior During Therapy: Flat affect   PT - Cognitive impairments: History  of cognitive impairments, No family/caregiver present to determine baseline                       PT - Cognition Comments: wife leaving at beginning of session to go to work- pt with some difficulty with recall of home set up and able to state when he is unsure of correct answer. Following commands: Intact       Cueing Cueing Techniques: Verbal cues, Tactile cues, Visual cues     General Comments      Exercises     Assessment/Plan    PT Assessment Patient needs continued PT services  PT Problem List Decreased strength;Decreased activity tolerance;Decreased balance;Decreased mobility;Decreased cognition;Decreased knowledge of use of DME       PT Treatment Interventions Gait training;DME instruction;Stair training;Functional mobility training;Therapeutic activities;Therapeutic exercise;Patient/family education;Neuromuscular re-education;Balance training    PT Goals (Current goals can be found in the Care Plan section)  Acute Rehab PT Goals Patient Stated Goal: return home PT Goal Formulation: With patient Time For Goal Achievement: 03/30/24 Potential to Achieve Goals: Good    Frequency Min 2X/week     Co-evaluation               AM-PAC PT 6 Clicks Mobility  Outcome Measure Help needed turning from your back to your side while in a flat bed without using bedrails?: None Help needed moving from lying on your back to sitting on the side of a flat bed without using bedrails?: None Help needed moving to and from a bed to a chair (including a wheelchair)?: A Little Help needed standing up from a chair using your arms (e.g., wheelchair or bedside chair)?: A Little Help needed to walk in hospital room?: A Little Help needed climbing 3-5 steps with a railing? : A Little 6 Click Score: 20    End of Session Equipment Utilized During Treatment: Gait belt Activity Tolerance: Patient tolerated treatment well Patient left: in chair;with call bell/phone within  reach;with chair alarm set Nurse Communication: Mobility status PT Visit Diagnosis: Muscle weakness (generalized) (M62.81);Unsteadiness on feet (R26.81)    Time: 9140-9078 PT Time Calculation (min) (ACUTE ONLY): 22 min   Charges:   PT Evaluation $PT Eval Low Complexity: 1 Low PT Treatments $Therapeutic Activity: 8-22 mins PT General Charges $$ ACUTE PT VISIT: 1 Visit         Tinnie BERRY PT, DPT  Acute Rehabilitation Services  Office [phone]  03/16/2024, 1:43 PM

## 2024-03-16 NOTE — Progress Notes (Signed)
 Discharge instructions reviewed with patient, verbalized understanding. All questions answered. All belongings accounted for. Patient to follow up with MD in  1-2 weeks. PIV removed. Assisted via WC to private vehicle.

## 2024-03-16 NOTE — TOC Transition Note (Signed)
 Transition of Care [facility]) - Discharge Note   Patient Details  Name: Tyler Dickerson MRN: [id number] Date of Birth: 1966-01-13  Transition [facility] CM/SW Contact:  Bascom Service, RN Phone Number: 03/16/2024, 11:59 AM   Clinical Narrative:  d/c home. No CM needs.     Final next level of care: Home/Self Care Barriers to Discharge: No Barriers Identified   Patient Goals and CMS Choice Patient states their goals for this hospitalization and ongoing recovery are:: Home CMS Medicare.gov Compare Post Acute Care list provided to:: Patient Choice offered to / list presented to : Patient [place] ownership interest in [facility].provided to:: Spouse    Discharge Placement                       Discharge Plan and Services Additional resources added to the After Visit Summary for     Discharge Planning Services: CM Consult                                 Social Drivers of Health (SDOH) Interventions SDOH Screenings   Food Insecurity: Patient Unable To Answer (03/13/2024)  Housing: Unknown (03/13/2024)  Transportation Needs: Patient Unable To Answer (03/13/2024)  Utilities: Patient Unable To Answer (03/13/2024)  Tobacco Use: Medium Risk (03/13/2024)     Readmission Risk Interventions     No data to display

## 2024-03-16 NOTE — Plan of Care (Signed)

## 2024-03-16 NOTE — Discharge Summary (Signed)
 Physician Discharge Summary  BRUCE CHURILLA FMW:[id number] DOB: 11-Mar-1965 DOA: 03/13/2024  PCP: Regino Slater, MD  Admit date: 03/13/2024 Discharge date: 03/16/2024  Admitted From: Home Disposition: Home  Recommendations for Outpatient Follow-up:  Follow up with PCP in 1 week with repeat CBC/BMP Outpatient follow-up with neurology Follow up in ED if symptoms worsen or new appear   Home Health: No Equipment/Devices: None  Discharge Condition: Stable CODE STATUS: Full Diet recommendation: Heart healthy  Brief/Interim Summary: 59 year old man PMH Parkinson's disease, Parkinson's dementia, REM sleep disorder presented with fever, chills and increasing confusion.  He was admitted for sepsis secondary to left lower lobe pneumonia and started on IV antibiotics.  During the hospitalization, his condition has improved.  Today is day 5 of antibiotics.  He is on room air and hemodynamically stable.  He will be discharged home today with outpatient follow-up with PCP.  No need for any more antibiotics.  Discharge Diagnoses:   Severe sepsis: Present on admission: Resolved Left lower lobe current acquired bacterial pneumonia, bacteria unspecified Acute metabolic encephalopathy Lactic acidosis: Resolved - Hemodynamically improving.  Cultures negative so far.  Currently on room air.  Currently on Rocephin  and Zithromax : Today is day 5 of antibiotics.  Mental status currently stable. - Diet as per SLP recommendations -He is on room air and hemodynamically stable.  He will be discharged home today with outpatient follow-up with PCP.  No need for any more antibiotics.   Leukocytosis - Resolved  Acute metabolic acidosis - Resolved   Elevated AST -Questionable cause.  Resolved   Anemia of chronic disease - From chronic illnesses.  Hemoglobin stable.  Monitor intermittently as an outpatient   Abnormal EKG Left bundle branch block -New compared to previous study 2018. This can be followed  up in the outpatient setting. No signs or symptoms of ACS.  Prior hospitalist reviewed with Dr. Pietro.    Renal insufficiency - Improved.  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  Continue memantine  and donepezil .  Outpatient follow-up with neurology   REM behavior disorder -Continue melatonin.  Outpatient follow-up with neurology.   Lower back pain with hyperreflexia and crossed adductor reflexes - Outpatient follow-up with neurology   Depression Situational. - Son passed away fairly recently.   Aortic atherosclerosis Approximately 50% stenosis of left common iliac artery -Outpatient follow-up with vascular surgery  Discharge Instructions  Discharge Instructions     Increase activity slowly   Complete by: As directed       Allergies as of 03/16/2024   No Known Allergies      Medication List     STOP taking these medications    ibuprofen  600 MG tablet Commonly known as: ADVIL    mirtazapine  15 MG tablet Commonly known as: REMERON        TAKE these medications    carbidopa -levodopa  25-100 MG tablet Commonly known as: SINEMET  IR 2 at 8am/2 at noon/1 at 4pm What changed:  how much to take how to take this when to take this additional instructions The timing of this medication is very important.   donepezil  10 MG tablet Commonly known as: ARICEPT  Take 1 tablet (10 mg total) by mouth every evening.   melatonin 5 MG Tabs Take 5 mg by mouth at bedtime.   memantine  10 MG tablet Commonly known as: NAMENDA  Take 1 tablet (10 mg total) by mouth 2 (two) times daily.  Follow-up Information     Koirala, Dibas, MD. Schedule an appointment as soon as possible for a visit in 1 week(s).   Specialty: Family Medicine Contact information: [address] [place] [address] [phone]                 Allergies[1]  Consultations: None   Procedures/Studies: CT Head Wo Contrast Result Date: 03/13/2024 EXAM: CT HEAD WITHOUT 03/13/2024 03:29:31 AM TECHNIQUE: CT of the head was performed without the administration of intravenous contrast. Automated exposure control, iterative reconstruction, and/or weight based adjustment of the mA/kV was utilized to reduce the radiation dose to as low as reasonably achievable. COMPARISON: 12/31/2020 CLINICAL HISTORY: Mental status change, unknown cause FINDINGS: BRAIN AND VENTRICLES: No acute intracranial hemorrhage. No mass effect or midline shift. No extra-axial fluid collection. No evidence of acute infarct. No hydrocephalus. ORBITS: No acute abnormality. SINUSES AND MASTOIDS: No acute abnormality. SOFT TISSUES AND SKULL: No acute skull fracture. No acute soft tissue abnormality. IMPRESSION: 1. No acute intracranial abnormality. Electronically signed by: Franky Crease MD 03/13/2024 03:38 AM EST RP Workstation: [id number]   CT ABDOMEN PELVIS W CONTRAST Result Date: 03/13/2024 EXAM: CT ABDOMEN AND PELVIS WITH CONTRAST 03/13/2024 03:29:31 AM TECHNIQUE: CT of the abdomen and pelvis was performed with the administration of 100 mL of iohexol  (OMNIPAQUE ) 300 MG/ML solution. Multiplanar reformatted images are provided for review. Automated exposure control, iterative reconstruction, and/or weight-based adjustment of the mA/kV was utilized to reduce the radiation dose to as low as reasonably achievable. COMPARISON: 03/17/2019 CLINICAL HISTORY: Sepsis. FINDINGS: LOWER CHEST: Consolidation in the left lower lobe compatible with pneumonia. LIVER: The liver is unremarkable. GALLBLADDER AND BILE DUCTS: Gallbladder is unremarkable. No biliary ductal dilatation. SPLEEN: No acute abnormality. PANCREAS: No acute abnormality. ADRENAL GLANDS: No acute abnormality. KIDNEYS, URETERS AND BLADDER: Bilateral simple appearing renal cysts, the largest 5.7 cm in the right upper pole. Per  consensus, no follow-up is needed for simple Bosniak type 1 and 2 renal cysts, unless the patient has a malignancy history or risk factors. No stones in the kidneys or ureters. No hydronephrosis. No perinephric or periureteral stranding. Urinary bladder is unremarkable. GI AND BOWEL: Stomach demonstrates no acute abnormality. Scattered colonic diverticulosis. Normal appendix. There is no bowel obstruction. PERITONEUM AND RETROPERITONEUM: No ascites. No free air. VASCULATURE: Aorta is normal in caliber. Aortoiliac atherosclerosis. Stenosis in the left common iliac artery, approximately 50%. LYMPH NODES: No lymphadenopathy. REPRODUCTIVE ORGANS: No acute abnormality. BONES AND SOFT TISSUES: No acute osseous abnormality. No focal soft tissue abnormality. IMPRESSION: 1. Left lower lobe pneumonia. 2. Aortoiliac atherosclerosis with approximately 50% stenosis in the left common iliac artery. Electronically signed by: Franky Crease MD 03/13/2024 03:37 AM EST RP Workstation: [id number]   DG Chest Port 1 View Result Date: 03/13/2024 EXAM: 1 VIEW(S) XRAY OF THE CHEST 03/13/2024 01:32:14 AM COMPARISON: 04/09/2022 CLINICAL HISTORY: Questionable sepsis - evaluate for abnormality FINDINGS: LUNGS AND PLEURA: Shallow inspiration. Peribronchial thickening with perihilar infiltrate suggesting airways disease. No pleural effusion. No pneumothorax. HEART AND MEDIASTINUM: Mild cardiac enlargement. Mediastinal contours appear intact. BONES AND SOFT TISSUES: No acute osseous abnormality. IMPRESSION: 1. Peribronchial thickening with perihilar infiltrate, suggestive of airways disease. Electronically signed by: Elsie Gravely MD 03/13/2024 01:35 AM EST RP Workstation: [id number]      Subjective: Patient seen and examined at bedside.  Wife at bedside states that patient is doing better.  No fever, vomiting, agitation reported.  Discharge Exam: Vitals:   03/15/24 2037 03/16/24 0542  BP: (!) 153/96 ROLLEN)  159/87  Pulse: 73   Resp: 18  18  Temp: 98.4 F (36.9 C) (!) 97.3 F (36.3 C)  SpO2: 97% 100%    General: Pt is alert, awake, not in acute distress.  Slightly confused.  On room air.  Slow to respond.  Poor historian. Cardiovascular: rate controlled, S1/S2 + Respiratory: bilateral decreased breath sounds at bases Abdominal: Soft, NT, ND, bowel sounds + Extremities: no edema, no cyanosis    The results of significant diagnostics from this hospitalization (including imaging, microbiology, ancillary and laboratory) are listed below for reference.     Microbiology: Recent Results (from the past 240 hours)  Blood Culture (routine x 2)     Status: None (Preliminary result)   Collection Time: 03/13/24  1:22 AM   Specimen: BLOOD  Result Value Ref Range Status   Specimen Description   Final    BLOOD SITE NOT SPECIFIED Performed at [facility], [address] [place], [place] [address]    Special Requests   Final    BOTTLES DRAWN AEROBIC AND ANAEROBIC Blood Culture adequate volume Performed at [facility], [address], [place], [place] [address]    Culture   Final    NO GROWTH 3 DAYS Performed at [facility], [address] [place], [place] [address]    Report Status PENDING  Incomplete  Resp panel by RT-PCR (RSV, Flu A&B, Covid) Anterior Nasal Swab     Status: None   Collection Time: 03/13/24  2:01 AM   Specimen: Anterior Nasal Swab  Result Value Ref Range Status   SARS Coronavirus 2 by RT PCR NEGATIVE NEGATIVE Final    Comment: (NOTE) SARS-CoV-2 target nucleic acids are NOT DETECTED.  The SARS-CoV-2 RNA is generally detectable in upper respiratory specimens during the acute phase of infection. The lowest concentration of SARS-CoV-2 viral copies this assay can detect is 138 copies/mL. A negative result does not preclude SARS-Cov-2 infection and should not be used as the sole basis for treatment or other patient management decisions. A negative result may  occur with  improper specimen collection/handling, submission of specimen other than nasopharyngeal swab, presence of viral mutation(s) within the areas targeted by this assay, and inadequate number of viral copies(<138 copies/mL). A negative result must be combined with clinical observations, patient history, and epidemiological information. The expected result is Negative.  Fact Sheet for Patients:  bloggercourse.com  Fact Sheet for Healthcare Providers:  seriousbroker.it  This test is no t yet approved or cleared by the [place]  FDA and  has been authorized for detection and/or diagnosis of SARS-CoV-2 by FDA under an Emergency Use Authorization (EUA). This EUA will remain  in effect (meaning this test can be used) for the duration of the COVID-19 declaration under Section 564(b)(1) of the Act, 21 U.S.C.section 360bbb-3(b)(1), unless the authorization is terminated  or revoked sooner.       Influenza A by PCR NEGATIVE NEGATIVE Final   Influenza B by PCR NEGATIVE NEGATIVE Final    Comment: (NOTE) The Xpert Xpress SARS-CoV-2/FLU/RSV plus assay is intended as an aid in the diagnosis of influenza from Nasopharyngeal swab specimens and should not be used as a sole basis for treatment. Nasal washings and aspirates are unacceptable for Xpert Xpress SARS-CoV-2/FLU/RSV testing.  Fact Sheet for Patients: bloggercourse.com  Fact Sheet for Healthcare Providers: seriousbroker.it  This test is not yet approved or cleared by the [place]  FDA and has been authorized for detection and/or diagnosis of SARS-CoV-2  by FDA under an Emergency Use Authorization (EUA). This EUA will remain in effect (meaning this test can be used) for the duration of the COVID-19 declaration under Section 564(b)(1) of the Act, 21 U.S.C. section 360bbb-3(b)(1), unless the authorization is terminated  or revoked.     Resp Syncytial Virus by PCR NEGATIVE NEGATIVE Final    Comment: (NOTE) Fact Sheet for Patients: bloggercourse.com  Fact Sheet for Healthcare Providers: seriousbroker.it  This test is not yet approved or cleared by the [place]  FDA and has been authorized for detection and/or diagnosis of SARS-CoV-2 by FDA under an Emergency Use Authorization (EUA). This EUA will remain in effect (meaning this test can be used) for the duration of the COVID-19 declaration under Section 564(b)(1) of the Act, 21 U.S.C. section 360bbb-3(b)(1), unless the authorization is terminated or revoked.  Performed at [facility], [address], [place], [place] [address]   Blood Culture (routine x 2)     Status: None (Preliminary result)   Collection Time: 03/13/24  4:02 AM   Specimen: BLOOD  Result Value Ref Range Status   Specimen Description   Final    BLOOD BLOOD RIGHT FOREARM Performed at [facility], [address] [place], [place] [address]    Special Requests   Final    BOTTLES DRAWN AEROBIC ONLY Blood Culture results may not be optimal due to an inadequate volume of blood received in culture bottles Performed at [facility], [address] [place], [place] [address]    Culture   Final    NO GROWTH 3 DAYS Performed at [facility], [address] [place], [place] [address]    Report Status PENDING  Incomplete     Labs: BNP (last 3 results) No results for input(s): BNP in the last 8760 hours. Basic Metabolic Panel: Recent Labs  Lab 03/13/24 0122 03/13/24 0639 03/14/24 0510 03/16/24 0537  NA 136 138 140 142  K 3.9 4.0 4.0 3.8  CL 104 109 113* 110  CO2 22 20* 20* 23  GLUCOSE 122* 101* 89 91  BUN 16 13 13 7   CREATININE 1.34* 1.11 1.07 1.02  CALCIUM 9.1 8.3* 8.7* 9.1  MG  --   --   --  2.3   Liver Function Tests: Recent Labs  Lab  03/13/24 0122 03/13/24 0639 03/16/24 0537  AST 47* 48* 29  ALT 6 12 20   ALKPHOS 62 50 55  BILITOT 1.7* 1.3* 0.5  PROT 6.7 5.6* 6.2*  ALBUMIN 3.6 3.0* 3.1*   No results for input(s): LIPASE, AMYLASE in the last 168 hours. No results for input(s): AMMONIA in the last 168 hours. CBC: Recent Labs  Lab 03/13/24 0122 03/13/24 0639 03/16/24 0537  WBC 15.7* 18.7* 6.4  NEUTROABS 13.6* 14.7* 3.6  HGB 12.7* 10.8* 10.9*  HCT 38.3* 32.7* 32.7*  MCV 87.4 87.7 85.6  PLT 161 161 238   Cardiac Enzymes: No results for input(s): CKTOTAL, CKMB, CKMBINDEX, TROPONINI in the last 168 hours. BNP: Invalid input(s): POCBNP CBG: No results for input(s): GLUCAP in the last 168 hours. D-Dimer No results for input(s): DDIMER in the last 72 hours. Hgb A1c No results for input(s): HGBA1C in the last 72 hours. Lipid Profile No results for input(s): CHOL, HDL, LDLCALC, TRIG, CHOLHDL, LDLDIRECT in the last 72 hours. Thyroid function studies No results for input(s): TSH, T4TOTAL, T3FREE, THYROIDAB in the last 72 hours.  Invalid input(s): FREET3 Anemia work up No results for input(s):  VITAMINB12, FOLATE, FERRITIN, TIBC, IRON, RETICCTPCT in the last 72 hours. Urinalysis    Component Value Date/Time   COLORURINE YELLOW 03/13/2024 0121   APPEARANCEUR CLEAR 03/13/2024 0121   LABSPEC 1.024 03/13/2024 0121   PHURINE 6.0 03/13/2024 0121   GLUCOSEU 50 (A) 03/13/2024 0121   HGBUR SMALL (A) 03/13/2024 0121   BILIRUBINUR NEGATIVE 03/13/2024 0121   KETONESUR 5 (A) 03/13/2024 0121   PROTEINUR 100 (A) 03/13/2024 0121   NITRITE NEGATIVE 03/13/2024 0121   LEUKOCYTESUR NEGATIVE 03/13/2024 0121   Sepsis Labs Recent Labs  Lab 03/13/24 0122 03/13/24 0639 03/16/24 0537  WBC 15.7* 18.7* 6.4   Microbiology Recent Results (from the past 240 hours)  Blood Culture (routine x 2)     Status: None (Preliminary result)   Collection Time: 03/13/24  1:22 AM    Specimen: BLOOD  Result Value Ref Range Status   Specimen Description   Final    BLOOD SITE NOT SPECIFIED Performed at [facility], [address], [place], [place] [address]    Special Requests   Final    BOTTLES DRAWN AEROBIC AND ANAEROBIC Blood Culture adequate volume Performed at [facility], [address], [place], [place] [address]    Culture   Final    NO GROWTH 3 DAYS Performed at [facility], [address], [place], [place] [address]    Report Status PENDING  Incomplete  Resp panel by RT-PCR (RSV, Flu A&B, Covid) Anterior Nasal Swab     Status: None   Collection Time: 03/13/24  2:01 AM   Specimen: Anterior Nasal Swab  Result Value Ref Range Status   SARS Coronavirus 2 by RT PCR NEGATIVE NEGATIVE Final    Comment: (NOTE) SARS-CoV-2 target nucleic acids are NOT DETECTED.  The SARS-CoV-2 RNA is generally detectable in upper respiratory specimens during the acute phase of infection. The lowest concentration of SARS-CoV-2 viral copies this assay can detect is 138 copies/mL. A negative result does not preclude SARS-Cov-2 infection and should not be used as the sole basis for treatment or other patient management decisions. A negative result may occur with  improper specimen collection/handling, submission of specimen other than nasopharyngeal swab, presence of viral mutation(s) within the areas targeted by this assay, and inadequate number of viral copies(<138 copies/mL). A negative result must be combined with clinical observations, patient history, and epidemiological information. The expected result is Negative.  Fact Sheet for Patients:  bloggercourse.com  Fact Sheet for Healthcare Providers:  seriousbroker.it  This test is no t yet approved or cleared by the [place]  FDA and  has been authorized for detection and/or diagnosis of SARS-CoV-2 by FDA under an  Emergency Use Authorization (EUA). This EUA will remain  in effect (meaning this test can be used) for the duration of the COVID-19 declaration under Section 564(b)(1) of the Act, 21 U.S.C.section 360bbb-3(b)(1), unless the authorization is terminated  or revoked sooner.       Influenza A by PCR NEGATIVE NEGATIVE Final   Influenza B by PCR NEGATIVE NEGATIVE Final    Comment: (NOTE) The Xpert Xpress SARS-CoV-2/FLU/RSV plus assay is intended as an aid in the diagnosis of influenza from Nasopharyngeal swab specimens and should not be used as a sole basis for treatment. Nasal washings and aspirates are unacceptable for Xpert Xpress SARS-CoV-2/FLU/RSV testing.  Fact Sheet for Patients: bloggercourse.com  Fact Sheet for Healthcare Providers: seriousbroker.it  This test is not yet approved or cleared by the [place]  FDA and  has been authorized for detection and/or diagnosis of SARS-CoV-2 by FDA under an Emergency Use Authorization (EUA). This EUA will remain in effect (meaning this test can be used) for the duration of the COVID-19 declaration under Section 564(b)(1) of the Act, 21 U.S.C. section 360bbb-3(b)(1), unless the authorization is terminated or revoked.     Resp Syncytial Virus by PCR NEGATIVE NEGATIVE Final    Comment: (NOTE) Fact Sheet for Patients: bloggercourse.com  Fact Sheet for Healthcare Providers: seriousbroker.it  This test is not yet approved or cleared by the [place]  FDA and has been authorized for detection and/or diagnosis of SARS-CoV-2 by FDA under an Emergency Use Authorization (EUA). This EUA will remain in effect (meaning this test can be used) for the duration of the COVID-19 declaration under Section 564(b)(1) of the Act, 21 U.S.C. section 360bbb-3(b)(1), unless the authorization is terminated or revoked.  Performed at [facility], [address] [place], [place] [address]   Blood Culture (routine x 2)     Status: None (Preliminary result)   Collection Time: 03/13/24  4:02 AM   Specimen: BLOOD  Result Value Ref Range Status   Specimen Description   Final    BLOOD BLOOD RIGHT FOREARM Performed at [facility], [address], [place], [place] [address]    Special Requests   Final    BOTTLES DRAWN AEROBIC ONLY Blood Culture results may not be optimal due to an inadequate volume of blood received in culture bottles Performed at [facility], [address], [place], [place] [address]    Culture   Final    NO GROWTH 3 DAYS Performed at [facility], [address], [place], [place] [address]    Report Status PENDING  Incomplete     Time coordinating discharge: 35 minutes  SIGNED:   Sophie Mao, MD  [facility] 03/16/2024, 9:08 AM      [1] No Known Allergies

## 2024-03-18 LAB — CULTURE, BLOOD (ROUTINE X 2)
Culture: NO GROWTH
Culture: NO GROWTH
Special Requests: ADEQUATE

## 2024-03-21 ENCOUNTER — Institutional Professional Consult (permissible substitution): Payer: Self-pay | Admitting: Psychology

## 2024-03-21 ENCOUNTER — Ambulatory Visit: Payer: Self-pay

## 2024-03-28 ENCOUNTER — Encounter: Admitting: Psychology

## 2024-08-28 ENCOUNTER — Ambulatory Visit: Payer: Self-pay | Admitting: Neurology
